# Patient Record
Sex: Female | Born: 1940 | Race: White | Hispanic: No | Marital: Married | Smoking: Current every day smoker
Health system: Southern US, Community
[De-identification: ages and names within clinical notes are randomized; demographics above are authoritative.]

## PROBLEM LIST (undated history)

## (undated) DIAGNOSIS — I739 Peripheral vascular disease, unspecified: Secondary | ICD-10-CM

## (undated) DIAGNOSIS — I499 Cardiac arrhythmia, unspecified: Secondary | ICD-10-CM

## (undated) DIAGNOSIS — I779 Disorder of arteries and arterioles, unspecified: Secondary | ICD-10-CM

## (undated) DIAGNOSIS — K219 Gastro-esophageal reflux disease without esophagitis: Secondary | ICD-10-CM

## (undated) DIAGNOSIS — E7800 Pure hypercholesterolemia, unspecified: Secondary | ICD-10-CM

## (undated) DIAGNOSIS — E039 Hypothyroidism, unspecified: Secondary | ICD-10-CM

## (undated) DIAGNOSIS — I10 Essential (primary) hypertension: Secondary | ICD-10-CM

## (undated) DIAGNOSIS — R0602 Shortness of breath: Secondary | ICD-10-CM

## (undated) DIAGNOSIS — J449 Chronic obstructive pulmonary disease, unspecified: Secondary | ICD-10-CM

## (undated) DIAGNOSIS — M1990 Unspecified osteoarthritis, unspecified site: Secondary | ICD-10-CM

## (undated) HISTORY — DX: Disorder of arteries and arterioles, unspecified: I77.9

## (undated) HISTORY — PX: APPENDECTOMY: SHX54

## (undated) HISTORY — PX: TUBAL LIGATION: SHX77

## (undated) HISTORY — PX: ABDOMINAL HYSTERECTOMY: SHX81

## (undated) HISTORY — DX: Peripheral vascular disease, unspecified: I73.9

---

## 2004-06-11 ENCOUNTER — Ambulatory Visit (HOSPITAL_COMMUNITY): Admission: RE | Admit: 2004-06-11 | Discharge: 2004-06-11 | Payer: Self-pay | Admitting: Internal Medicine

## 2004-06-11 HISTORY — PX: COLONOSCOPY: SHX174

## 2004-06-11 HISTORY — PX: ESOPHAGOGASTRODUODENOSCOPY: SHX1529

## 2004-09-07 ENCOUNTER — Ambulatory Visit: Payer: Self-pay | Admitting: Internal Medicine

## 2004-10-07 ENCOUNTER — Ambulatory Visit (HOSPITAL_COMMUNITY): Admission: RE | Admit: 2004-10-07 | Discharge: 2004-10-07 | Payer: Self-pay | Admitting: Internal Medicine

## 2004-10-07 ENCOUNTER — Ambulatory Visit: Payer: Self-pay | Admitting: Internal Medicine

## 2004-10-07 HISTORY — PX: ESOPHAGOGASTRODUODENOSCOPY: SHX1529

## 2005-04-07 ENCOUNTER — Ambulatory Visit: Payer: Self-pay | Admitting: Internal Medicine

## 2006-04-26 ENCOUNTER — Ambulatory Visit: Payer: Self-pay | Admitting: Internal Medicine

## 2006-05-19 ENCOUNTER — Ambulatory Visit (HOSPITAL_COMMUNITY): Admission: RE | Admit: 2006-05-19 | Discharge: 2006-05-19 | Payer: Self-pay | Admitting: Family Medicine

## 2006-05-24 ENCOUNTER — Ambulatory Visit (HOSPITAL_COMMUNITY): Admission: RE | Admit: 2006-05-24 | Discharge: 2006-05-24 | Payer: Self-pay | Admitting: Family Medicine

## 2008-11-01 ENCOUNTER — Ambulatory Visit (HOSPITAL_COMMUNITY): Admission: RE | Admit: 2008-11-01 | Discharge: 2008-11-01 | Payer: Self-pay | Admitting: Family Medicine

## 2009-08-12 ENCOUNTER — Encounter: Admission: RE | Admit: 2009-08-12 | Discharge: 2009-08-12 | Payer: Self-pay | Admitting: Cardiovascular Disease

## 2010-03-12 ENCOUNTER — Encounter: Admission: RE | Admit: 2010-03-12 | Discharge: 2010-03-12 | Payer: Self-pay | Admitting: Neurology

## 2010-10-11 ENCOUNTER — Encounter: Payer: Self-pay | Admitting: Neurology

## 2011-02-05 NOTE — Op Note (Signed)
NAME:  [name], [name]                  ACCOUNT NO.:  [id number]   MEDICAL RECORD NO.:  [id number]          PATIENT TYPE:  AMB   LOCATION:  DAY                           FACILITY:  [facility]   PHYSICIAN:  R. Roetta Sessions, M.D. DATE OF BIRTH:  [birth year]   DATE OF PROCEDURE:  06/11/2004  DATE OF DISCHARGE:                                 OPERATIVE REPORT   PROCEDURE:  EGD with biopsy followed by colonoscopy.   INDICATIONS FOR PROCEDURE:  The patient is a 70 year old lady with a recent  episode of epigastric pain which she states was self-limiting.  She has not  had any epigastric pain in a month.  No odynophagia, dysphagia, early  satiety, or reflux symptoms.  No melena or rectal bleeding.  She has also  been referred for screening colonoscopy.  No family history of colorectal  neoplasia.  She has never had a colonoscopy previously.  EGD and colonoscopy  are now being done.  This approach has been discussed with the patient at  length previously.  The potential risks, benefits, and alternatives have  been reviewed and questions answered.  She is agreeable.  Please see the  documentation in the medical record for more information.   PROCEDURE:  O2 saturation, blood pressure, pulses, and respirations were  monitored throughout the entirety of both procedures.  Conscious sedation  was with Versed 5 mg IV, Demerol 100 mg IV in divided doses.  The instrument  used was the Olympus video chip system.   EGD FINDINGS:  Examination of the tubular esophagus revealed distal erosions  just above the EG junction.  No Barrett's esophagus or other abnormalities.  The EG junction was easily traversed.   Stomach:  The gastric cavity was empty and insufflated well with air.  Thorough examination of the gastric mucosa including retroflex view in the  proximal stomach and esophagogastric junction demonstrated an area of  heaped up adenomatous-appearing prepyloric mucosa localized to about a 3 x  4-cm area.   This area was soft when palpated with the biopsy forceps.  There  was also a 0.75-cm antral ulcer superior to the pyloric channel (located a  little ways from the adenomatous-appearing mucosa).  These areas were  biopsied separately.  The remainder of the gastric mucosa appeared normal.  The pylorus was patent and easily traversed.   Duodenum:  Examination of the bulb and second portion revealed no  abnormalities.   THERAPEUTIC/DIAGNOSTIC MANEUVERS PERFORMED:  As above.   The patient tolerated the procedure well and was prepared for colonoscopy.   COLONOSCOPY FINDINGS:  Digital rectal examination revealed no abnormalities,  aside from multiple external hemorrhoid tags.   ENDOSCOPIC FINDINGS:  The prep was good.   Rectum:  Examination of the rectal mucosa including retroflex view of the  anal verge revealed no abnormalities.   Colon:  The colonic mucosa was surveyed from the rectosigmoid junction to 40  cm.  At this level the colon took an acute turn.  It was noncompliant.  I  was unable to negotiate it with an adult colonoscope.  I withdrew the  adult  scope and obtained a pediatric scope.  It was notable that both pediatric  scopes were not readily available, and this caused interruption of the  procedure for 30 minutes.  I did obtain the pediatric colonoscope and was  able to easily advance the scope beyond the left colon all the way around to  the cecum/ileocecal valve/appendiceal orifice.  These structures were well-  seen.  From this level, the scope was slowly withdrawn.  All previously  mentioned mucosal surfaces were again seen.  The patient had a couple of  small sigmoid diverticula.  The remainder of the colonic mucosa appeared  normal.  The patient tolerated both of the procedures well and was reactive  in endoscopy.   EGD IMPRESSION:  1.  Distal esophageal erosions consistent with mild erosive reflux      esophagitis.  Otherwise normal esophagus.  2.  Heaped up  adenomatous-appearing prepyloric antral mucosa of uncertain      significance, biopsied.  3.  A single 0.75-cm antral prepyloric ulcer, also biopsied separately.  The      remainder of the gastric mucosa appeared normal.  4.  Patent pylorus.  5.  Normal first and second portions of the duodenum.   COLONOSCOPY IMPRESSION:  1.  External hemorrhoids.  Otherwise normal rectum.  2.  A few scattered left-sided diverticula.  The remainder of the colonic      mucosa appeared normal.   RECOMMENDATIONS:  1.  Begin Aciphex 20 mg orally daily.  2.  We need to make sure she is avoiding all nonsteroidals.  3.  Check H pylori serologies.  4.  Follow up on biopsies.  5.  Followup appointment with [place] in three months.  She will need a repeat      EGD down the line to document gastric ulcer healing.  6.  Repeat screening colonoscopy in 10 years.      RMR/MEDQ  D:  06/11/2004  T:  06/11/2004  Job:  [id number]   cc:   Mila Homer. [name], M.D.  [address] [place], [place] [address]  Fax: [phone]

## 2011-02-05 NOTE — H&P (Signed)
NAME:  [name], [name]                  ACCOUNT NO.:  [id number]   MEDICAL RECORD NO.:  [id number]           PATIENT TYPE:   LOCATION:                                 FACILITY:   PHYSICIAN:  R. Roetta Sessions, M.D. DATE OF BIRTH:  07-14-1941   DATE OF ADMISSION:  DATE OF DISCHARGE:  LH                                HISTORY & PHYSICAL   CHIEF COMPLAINT:  Esophagogastroduodenoscopy, follow up peptic ulcer  disease.   HISTORY OF PRESENT ILLNESS:  [name] is a 70 year old Caucasian female  patient of Dr. Jena Gauss who had undergone EGD for one month episode of self-  limiting epigastric pain. She was found to have distal esophageal erosions  consistent with mild reflux esophagitis. She was also found to be  helicobacter pylori positive. It was also noted that she had a single 0.75-  cm antral prepyloric ulcer as well. She has been treated with triple drug  therapy for helicobacter pylori and tolerated this well. She is doing quite  well. She denies any epigastric pain, heartburn, indigestion, dysphagia,  odynophagia. She denies any nausea or vomiting. She denies any anorexia or  fatigue. Denies any abdominal pain, rectal bleeding, or melena.   Biopsy from the antrum revealed chronic gastritis with prominent lymphoid  infiltrating the area of deep lamina propria and muscularis mucosa.   PAST MEDICAL HISTORY:  1.  Hypothyroidism.  2.  Total hysterectomy.  3.  Appendectomy.   CURRENT MEDICATIONS:  1.  Multivitamin daily.  2.  Zocor 10 mg daily.  3.  Aspirin 81 mg daily on hold currently.  4.  Synthroid 125 mg daily.  5.  Metoprolol 50 mg daily.  6.  Vitamin E and vitamin D daily.  7.  Caltrate daily.  8.  Aciphex 20 mg daily.   ALLERGIES:  CODEINE.   FAMILY HISTORY:  No known family history of colorectal carcinoma of chronic  GI problems. Mother has history of coronary artery disease. Father deceased  secondary to carcinoma. One sister deceased secondary to renal failure. She  has  three sisters and three brothers who are relatively healthy.   SOCIAL HISTORY:  [name] has been married for 44 years. She has two grown  healthy children. She is currently retired. She reports a 47-pack-year  history. Denies any alcohol or drug use.   REVIEW OF SYSTEMS:  CONSTITUTIONAL:  Weight is stable. She denies any fever  or chills. She denies any early satiety. She denies any anorexia.  CARDIOVASCULAR:  Denies any chest pain or palpitations. PULMONARY:  Denies  any shortness of breath, dyspnea, cough, or hemoptysis. HEMATOLOGICAL:  Denies any history of blood dyscrasias, anemia, easy bruising, or epistaxis.  GASTROINTESTINAL:  See HPI.   PHYSICAL EXAMINATION:  VITAL SIGNS:  Weight 174 pounds, temperature 97.9,  blood pressure 158/80, pulse 68.  GENERAL:  [name] is a well-developed, well-nourished, 70 year old  Caucasian female who is alert, pleasant, cooperative in no acute distress.  HEENT:  Sclerae clear, nonicteric. Conjunctivae pink. Oropharynx pink and  moist without any lesions.  NECK:  Supple without any masses  or thyromegaly.  CHEST:  Heart rate regular rhythm with normal S1 and S2 without any murmurs,  clicks, rubs, or gallops. Lung are clear to auscultation bilaterally.  ABDOMEN:  She does have epigastric tenderness on deep palpation. The abdomen  is nondistended without palpable masses or palpable organomegaly. No rebound  tenderness or guarding.  EXTREMITIES:  2+ pedal pulses bilaterally. No edema.  SKIN:  Pink, warm and dry without rash or jaundice.   ASSESSMENT:  [name] is a 70 year old Caucasian female with one month  history of epigastric pain which has now resolved. She was found to be  helicobacter pylori positive and also found to have a single 3/4-cm antral  prepyloric ulcer on esophagogastroduodenoscopy by Dr. Jena Gauss June 11, 2004. She is due for repeat EGD to verify resolution of this ulcer.  Symptomatically, she is doing quite well.    RECOMMENDATIONS:  1.  Will schedule EGD with Dr. Jena Gauss in the near future. I have discussed      the procedure including the risks and benefits which include but are not      limited to bleeding, infection, perforation, and drug reaction. Consent      will be obtained. She agrees with this plan. She is to continue to hold      her aspirin at this time with further recommendations pending procedure.  2.  She is to continue Aciphex 20 mg daily.  3.  Recommended to quit smoking.  4.  Further recommendations pending EGD.     Kand   KC/MEDQ  D:  09/07/2004  T:  09/07/2004  Job:  [id number]   cc:   Mila Homer. [name], M.D.  [address] [place], [place] [address]  Fax: [phone]

## 2011-02-05 NOTE — Op Note (Signed)
NAME:  [name], [name]                  ACCOUNT NO.:  [id number]   MEDICAL RECORD NO.:  [id number]          PATIENT TYPE:  AMB   LOCATION:  DAY                           FACILITY:  [facility]   PHYSICIAN:  R. Roetta Sessions, M.D. DATE OF BIRTH:  1940-11-17   DATE OF PROCEDURE:  10/07/2004  DATE OF DISCHARGE:                                 OPERATIVE REPORT   PROCEDURE PERFORMED:  Diagnostic esophagogastroduodenoscopy.   INDICATIONS FOR PROCEDURE:  The patient is a 70 year old lady who was  diagnosed with a prepyloric peptic ulcer back in September of 2005.  Colonoscopy at that time demonstrated external hemorrhoids, left-sided  diverticula.  Helicobacter pylori was found to be present.  She was treated  with triple drug therapy clinically.  She has done very well.  She also had  some reflux esophagitis on prior EGD.  Biopsies of the ulcer were negative  for malignancy.  Esophagogastroduodenoscopy is now being done to document  that this ulcer has healed.  This approach has been discussed with the  patient at length previously and again today at bedside.  The potential  risks, benefits and alternatives have been reviewed, questions answered.   Oxygen saturations, blood pressure, pulse and respiratory rate were  monitored throughout the entire procedure.  Conscious sedation was Versed 3  mg IV, Demerol 75 mg IV in divided doses.  Instrument was the Olympus video  chip system.   FINDINGS:  Examination of the tubular esophagus revealed erosions  circumferentially within 0.5 cm of the esophagogastric junction.  Esophageal  mucosa otherwise appeared normal.  The esophagogastric junction was easily  traversed.   Stomach:  The gastric cavity was more or less empty except for some bile  stained mucus.  This was easily suctioned away.  It insufflated well with  air.  A thorough examination of the gastric mucosa including retroflex view  of the proximal stomach, esophagogastric junction demonstrated no  mucosal  abnormalities.  The previously noted antral ulcer has healed completely.  Pylorus was patent, easily traversed.  Examination of the bulb and second  portion revealed no abnormalities.   THERAPEUTIC/DIAGNOSTIC MANEUVERS PERFORMED:  None.   The patient tolerated the procedure well and was reacted in endoscopy.   IMPRESSION:  1.  Distal esophageal erosions consistent with erosive reflux esophagus,      otherwise normal esophagus, normal stomach, previously noted ulcer has      healed completely.  2.  Normal D1 and D2.   RECOMMENDATIONS:  1.  The patient is encouraged to quit smoking.  2.  Continue Aciphex 20 mg by mouth daily.  3.  Antireflux measures emphasized.  4.  From a screening standpoint the patient needs a repeat colonoscopy in      2015.  5.  Plan to see this nice lady back in the office six months from now.     [name]   RMR/MEDQ  D:  10/07/2004  T:  10/07/2004  Job:  [id number]   cc:   Mila Homer. [name], M.D.  [address] [place], [place] [address]  Fax:  [phone] 

## 2011-04-09 ENCOUNTER — Other Ambulatory Visit (HOSPITAL_COMMUNITY): Payer: Self-pay | Admitting: Family Medicine

## 2011-04-09 ENCOUNTER — Ambulatory Visit (HOSPITAL_COMMUNITY)
Admission: RE | Admit: 2011-04-09 | Discharge: 2011-04-09 | Disposition: A | Discharge status: Home / Self Care | Payer: Medicare Other | Source: Ambulatory Visit | Attending: Family Medicine | Admitting: Family Medicine

## 2011-04-09 DIAGNOSIS — J40 Bronchitis, not specified as acute or chronic: Secondary | ICD-10-CM

## 2011-04-09 DIAGNOSIS — R05 Cough: Secondary | ICD-10-CM | POA: Insufficient documentation

## 2011-04-09 DIAGNOSIS — R51 Headache: Secondary | ICD-10-CM | POA: Insufficient documentation

## 2011-04-09 DIAGNOSIS — R059 Cough, unspecified: Secondary | ICD-10-CM | POA: Insufficient documentation

## 2012-08-25 ENCOUNTER — Other Ambulatory Visit: Payer: Self-pay | Admitting: Neurological Surgery

## 2012-08-25 DIAGNOSIS — I729 Aneurysm of unspecified site: Secondary | ICD-10-CM

## 2012-09-08 ENCOUNTER — Other Ambulatory Visit: Payer: Self-pay | Admitting: Neurological Surgery

## 2012-09-08 LAB — BUN: BUN: 10 mg/dL (ref 6–23)

## 2012-10-02 ENCOUNTER — Ambulatory Visit
Admission: RE | Admit: 2012-10-02 | Discharge: 2012-10-02 | Disposition: A | Discharge status: Home / Self Care | Payer: Medicare Other | Source: Ambulatory Visit | Attending: Neurological Surgery | Admitting: Neurological Surgery

## 2012-10-02 DIAGNOSIS — I729 Aneurysm of unspecified site: Secondary | ICD-10-CM

## 2012-10-02 MED ORDER — IOHEXOL 350 MG/ML SOLN
100.0000 mL | Freq: Once | INTRAVENOUS | Status: AC | PRN
  Administered 2012-10-02: 100 mL via INTRAVENOUS

## 2013-01-04 ENCOUNTER — Other Ambulatory Visit (HOSPITAL_COMMUNITY): Payer: Self-pay | Admitting: Cardiovascular Disease

## 2013-01-04 DIAGNOSIS — I739 Peripheral vascular disease, unspecified: Secondary | ICD-10-CM

## 2013-01-17 ENCOUNTER — Ambulatory Visit (HOSPITAL_COMMUNITY)
Admission: RE | Admit: 2013-01-17 | Discharge: 2013-01-17 | Disposition: A | Discharge status: Home / Self Care | Payer: Medicare Other | Source: Ambulatory Visit | Attending: Cardiovascular Disease | Admitting: Cardiovascular Disease

## 2013-01-17 DIAGNOSIS — I739 Peripheral vascular disease, unspecified: Secondary | ICD-10-CM | POA: Insufficient documentation

## 2013-01-17 NOTE — Progress Notes (Signed)
Carotid artery duplex completed.   01/17/2013  Madilyn Cephas, RDMS, RDCS  

## 2013-02-09 ENCOUNTER — Telehealth: Payer: Self-pay | Admitting: *Deleted

## 2013-02-09 DIAGNOSIS — I6523 Occlusion and stenosis of bilateral carotid arteries: Secondary | ICD-10-CM

## 2013-02-09 NOTE — Telephone Encounter (Signed)
Message copied by Marella Bile on Fri Feb 09, 2013  4:34 PM ------      Message from: Runell Gess      Created: Wed Jan 31, 2013  7:41 PM       Mild-Mod  LICA stenosis. Repeat in 6 mo ------

## 2013-02-09 NOTE — Telephone Encounter (Signed)
Carotid dopplers in 6 months

## 2013-04-10 ENCOUNTER — Other Ambulatory Visit (HOSPITAL_COMMUNITY): Payer: Self-pay | Admitting: Family Medicine

## 2013-04-10 ENCOUNTER — Ambulatory Visit (HOSPITAL_COMMUNITY)
Admission: RE | Admit: 2013-04-10 | Discharge: 2013-04-10 | Disposition: A | Discharge status: Home / Self Care | Payer: Medicare Other | Source: Ambulatory Visit | Attending: Family Medicine | Admitting: Family Medicine

## 2013-04-10 DIAGNOSIS — R109 Unspecified abdominal pain: Secondary | ICD-10-CM

## 2013-04-10 DIAGNOSIS — N289 Disorder of kidney and ureter, unspecified: Secondary | ICD-10-CM | POA: Insufficient documentation

## 2013-04-10 DIAGNOSIS — M549 Dorsalgia, unspecified: Secondary | ICD-10-CM

## 2013-04-12 ENCOUNTER — Other Ambulatory Visit (HOSPITAL_COMMUNITY): Payer: Self-pay | Admitting: Family Medicine

## 2013-04-12 ENCOUNTER — Ambulatory Visit (HOSPITAL_COMMUNITY)
Admission: RE | Admit: 2013-04-12 | Discharge: 2013-04-12 | Disposition: A | Discharge status: Home / Self Care | Payer: Medicare Other | Source: Ambulatory Visit | Attending: Family Medicine | Admitting: Family Medicine

## 2013-04-12 DIAGNOSIS — M545 Low back pain, unspecified: Secondary | ICD-10-CM | POA: Insufficient documentation

## 2013-04-12 DIAGNOSIS — D1809 Hemangioma of other sites: Secondary | ICD-10-CM | POA: Insufficient documentation

## 2013-04-12 DIAGNOSIS — M549 Dorsalgia, unspecified: Secondary | ICD-10-CM

## 2013-04-12 DIAGNOSIS — IMO0002 Reserved for concepts with insufficient information to code with codable children: Secondary | ICD-10-CM | POA: Insufficient documentation

## 2013-04-12 DIAGNOSIS — R109 Unspecified abdominal pain: Secondary | ICD-10-CM

## 2013-04-12 DIAGNOSIS — M51379 Other intervertebral disc degeneration, lumbosacral region without mention of lumbar back pain or lower extremity pain: Secondary | ICD-10-CM | POA: Insufficient documentation

## 2013-04-12 DIAGNOSIS — M546 Pain in thoracic spine: Secondary | ICD-10-CM | POA: Insufficient documentation

## 2013-04-12 DIAGNOSIS — M5137 Other intervertebral disc degeneration, lumbosacral region: Secondary | ICD-10-CM | POA: Insufficient documentation

## 2013-04-12 DIAGNOSIS — R933 Abnormal findings on diagnostic imaging of other parts of digestive tract: Secondary | ICD-10-CM | POA: Insufficient documentation

## 2013-04-12 LAB — POCT I-STAT, CHEM 8
BUN: 5 mg/dL — ABNORMAL LOW (ref 6–23)
Calcium, Ion: 1.21 mmol/L (ref 1.13–1.30)
Creatinine, Ser: 0.9 mg/dL (ref 0.50–1.10)
Glucose, Bld: 116 mg/dL — ABNORMAL HIGH (ref 70–99)
HCT: 44 % (ref 36.0–46.0)
Potassium: 4 mEq/L (ref 3.5–5.1)
Sodium: 141 mEq/L (ref 135–145)

## 2013-04-12 MED ORDER — IOHEXOL 300 MG/ML  SOLN
100.0000 mL | Freq: Once | INTRAMUSCULAR | Status: AC | PRN
  Administered 2013-04-12: 100 mL via INTRAVENOUS

## 2013-04-25 ENCOUNTER — Encounter (HOSPITAL_COMMUNITY): Payer: Self-pay | Admitting: Pharmacy Technician

## 2013-04-25 NOTE — Consult Note (Signed)
NAME:  [name], [name]                  ACCOUNT NO.:  [id number]  MEDICAL RECORD NO.:  [id number]  LOCATION:  RAD                           FACILITY:  [facility]  PHYSICIAN:  Addysyn Fern, M.D. 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  She was seen and worked up medically by Dr. Knowlton.  She has also been seen this year as well by Dr. Berry and Dr. Elsner.  In essence, she was being worked up for flank pain by Dr. Knowlton, who noted on CT scan the presence of a porcelain gallbladder and referred her after his medical workup to me for surgery.  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  I discussed these findings with the patient telling her that this carried a very high risk for cancer and that the gallbladder should be removed and even in the absence of symptoms.  We discussed laparoscopic removal and I discussed the complications not limited to, but including bleeding, infection, damage to bile ducts, perforation of organs, transitory diarrhea, and the possibility of open surgery might be required.  Informed consent was obtained.  PAST MEDICAL HISTORY:  The patient has a history of thyroid disease, hypothyroidism, hypertension, hypercholesterolemia, COPD, tobacco abuse, and she has also been followed for a cerebral aneurysm which according to her last visit this has not any undergone any changes.  She was seen last in January of this year with this and there was some question whether or not this was really an aneurysm.  She is also being seen by Dr. Berry, who performs carotid Doppler studies on her for the presence of bilateral bruits.  PAST SURGICAL HISTORY:  An appendectomy in childhood.  She  also had a TAH-BSO some 12 years ago.  She had a tubal ligation at the age of 30. She also had some uterine suspension surgery done by Dr. Destefano years ago as well.  MEDICATIONS:  Please consult chart.  ALLERGIES:  She states that she has an allergy to codeine.  SOCIAL HISTORY:  She smokes half a pack of cigarettes per day.  She does not abuse alcohol.  PHYSICAL EXAMINATION:  VITAL SIGNS:  She is 5 feet 5 inches and weighs 164 pounds.  She has a temperature is 98.7, pulse was regular at 60, respirations 14, blood pressure is 142/70. HEENT:  Head is normocephalic.  Eyes, extraocular movements are intact. Pupils were round and reactive to light and accommodation.  There is no conjunctiva pallor or scleral injection.  The sclera has a normal tincture.  Nasal and oral mucosa are somewhat erythematous, but moist. There is no cervical adenopathy appreciated.  As noted there are bilateral bruits.  There is no thyromegaly.  No jugular vein distention. CHEST:  Clear, both anterior and posterior auscultation. HEART:  Regular rhythm. BREASTS AND AXILLA:  Without masses.  No history of nipple discharge. ABDOMEN:  Soft.  No   hernias.  She has a midline incision and also a Pfannenstiel incision, and also another right lower quadrant incision consistent with her previously mentioned surgeries. RECTAL:  There are no masses palpated and the stool is guaiac-negative. EXTREMITIES:  The patient has no pedal edema.  Pulses are symmetrical. The patient has varicosities bilaterally.  REVIEW OF SYSTEMS:  No history of migraines, seizures, or any lateralizing neurological findings.  The patient has had carotid Doppler studies for followup on bruits as mentioned. ENDOCRINE:  No history of diabetes.  She does have a history of thyroid disease, hypothyroidism.  She has no history of adrenal problems. CARDIOPULMONARY:  History of hypertension, hypercholesterolemia, and bilateral carotid bruits with no  indication for surgery according to the last visit. MUSCULOSKELETAL:  She has chronic arthritic back complaints. OB/GYN:  She is a gravida 2, para 2, cesarean 0, abortus 0 female.  She has had a past family history of a sister with carcinoma of the breast. As stated she has had a TAH-BSO, tubal ligation, and uterine suspension. Her last mammogram was this year and was normal.  Last menstrual period was 12 years ago when she had her hysterectomy. GI:  No history of hepatitis, constipation, diarrhea, bright red rectal bleeding, black tarry stools or history of inflammatory bowel disease or irritable bowel syndrome.  No history of unexplained weight loss.  The patient has diverticulosis.  Last colonoscopy was in 2005. GU:  No frequency, dysuria, or history of kidney stones.  Review of history and physical, therefore [name] has been seen by the medical service and cleared by Dr. Knowlton, and we will plan for laparoscopic cholecystectomy due to the findings of porcelain gallbladder to rule out carcinoma of the gallbladder.  This was discussed in detail with the patient and informed consent was obtained. We will also send a copy of this dictation to Dr. Knowlton, Dr. Berry, and Dr. Elsner.  I would like to thank Dr. Knowlton for his confidence in sending his patient my way.     Akili Cuda, M.D.     WB/MEDQ  D:  04/25/2013  T:  04/25/2013  Job:  [id number]  cc:   Stephen D. Knowlton, M.D. Fax: [phone]  Jonathan Berry, M.D. Fax: [phone]  Henry J. Elsner, M.D. Fax: [phone] 

## 2013-04-25 NOTE — Patient Instructions (Addendum)
[name]  04/25/2013   Your procedure is scheduled on:  04/30/2013  Report to [facility] at  945  AM.  Call this number if you have problems the morning of surgery: [phone]   Remember:   Do not eat food or drink liquids after midnight.   Take these medicines the morning of surgery with A SIP OF WATER: none   Do not wear jewelry, make-up or nail polish.  Do not wear lotions, powders, or perfumes.   Do not shave 48 hours prior to surgery. Men may shave face and neck.  Do not bring valuables to the hospital.  [facility] is not responsible for any belongings or valuables.  Contacts, dentures or bridgework may not be worn into surgery.  Leave suitcase in the car. After surgery it may be brought to your room.  For patients admitted to the hospital, checkout time is 11:00 AM the day of discharge.   Patients discharged the day of surgery will not be allowed to drive home.  Name and phone number of your driver: family  Special Instructions: Shower using CHG 2 nights before surgery and the night before surgery.  If you shower the day of surgery use CHG.  Use special wash - you have one bottle of CHG for all showers.  You should use approximately 1/3 of the bottle for each shower.   Please read over the following fact sheets that you were given: Pain Booklet, Coughing and Deep Breathing, Surgical Site Infection Prevention, Anesthesia Post-op Instructions and Care and Recovery After Surgery Laparoscopic Cholecystectomy Laparoscopic cholecystectomy is surgery to remove the gallbladder. The gallbladder is located slightly to the right of center in the abdomen, behind the liver. It is a concentrating and storage sac for the bile produced in the liver. Bile aids in the digestion and absorption of fats. Gallbladder disease (cholecystitis) is an inflammation of your gallbladder. This condition is usually caused by a buildup of gallstones (cholelithiasis) in your gallbladder. Gallstones can block  the flow of bile, resulting in inflammation and pain. In severe cases, emergency surgery may be required. When emergency surgery is not required, you will have time to prepare for the procedure. Laparoscopic surgery is an alternative to open surgery. Laparoscopic surgery usually has a shorter recovery time. Your common bile duct may also need to be examined and explored. Your caregiver will discuss this with you if he or she feels this should be done. If stones are found in the common bile duct, they may be removed. LET YOUR CAREGIVER KNOW ABOUT:  Allergies to food or medicine.  Medicines taken, including vitamins, herbs, eyedrops, over-the-counter medicines, and creams.  Use of steroids (by mouth or creams).  Previous problems with anesthetics or numbing medicines.  History of bleeding problems or blood clots.  Previous surgery.  Other health problems, including diabetes and kidney problems.  Possibility of pregnancy, if this applies. RISKS AND COMPLICATIONS All surgery is associated with risks. Some problems that may occur following this procedure include:  Infection.  Damage to the common bile duct, nerves, arteries, veins, or other internal organs such as the stomach or intestines.  Bleeding.  A stone may remain in the common bile duct. BEFORE THE PROCEDURE  Do not take aspirin for 3 days prior to surgery or blood thinners for 1 week prior to surgery.  Do not eat or drink anything after midnight the night before surgery.  Let your caregiver know if you develop a cold  or other infectious problem prior to surgery.  You should be present 60 minutes before the procedure or as directed. PROCEDURE  You will be given medicine that makes you sleep (general anesthetic). When you are asleep, your surgeon will make several small cuts (incisions) in your abdomen. One of these incisions is used to insert a small, lighted scope (laparoscope) into the abdomen. The laparoscope helps the  surgeon see into your abdomen. Carbon dioxide gas will be pumped into your abdomen. The gas allows more room for the surgeon to perform your surgery. Other operating instruments are inserted through the other incisions. Laparoscopic procedures may not be appropriate when:  There is major scarring from previous surgery.  The gallbladder is extremely inflamed.  There are bleeding disorders or unexpected cirrhosis of the liver.  A pregnancy is near term.  Other conditions make the laparoscopic procedure impossible. If your surgeon feels it is not safe to continue with a laparoscopic procedure, he or she will perform an open abdominal procedure. In this case, the surgeon will make an incision to open the abdomen. This gives the surgeon a larger view and field to work within. This may allow the surgeon to perform procedures that sometimes cannot be performed with a laparoscope alone. Open surgery has a longer recovery time. AFTER THE PROCEDURE  You will be taken to the recovery area where a nurse will watch and check your progress.  You may be allowed to go home the same day.  Do not resume physical activities until directed by your caregiver.  You may resume a normal diet and activities as directed. Document Released: 09/06/2005 Document Revised: 11/29/2011 Document Reviewed: 02/19/2011 [facility] Patient Information 2014 [place], [place]. PATIENT INSTRUCTIONS POST-ANESTHESIA  IMMEDIATELY FOLLOWING SURGERY:  Do not drive or operate machinery for the first twenty four hours after surgery.  Do not make any important decisions for twenty four hours after surgery or while taking narcotic pain medications or sedatives.  If you develop intractable nausea and vomiting or a severe headache please notify your doctor immediately.  FOLLOW-UP:  Please make an appointment with your surgeon as instructed. You do not need to follow up with anesthesia unless specifically instructed to do so.  WOUND CARE  INSTRUCTIONS (if applicable):  Keep a dry clean dressing on the anesthesia/puncture wound site if there is drainage.  Once the wound has quit draining you may leave it open to air.  Generally you should leave the bandage intact for twenty four hours unless there is drainage.  If the epidural site drains for more than 36-48 hours please call the anesthesia department.  QUESTIONS?:  Please feel free to call your physician or the hospital operator if you have any questions, and they will be happy to assist you.

## 2013-04-25 NOTE — Consult Note (Deleted)
[name], [name]                  ACCOUNT NO.:  [id number]  MEDICAL RECORD NO.:  [id number]  LOCATION:  RAD                           FACILITY:  [facility]  PHYSICIAN:  Barbaraann Barthel, M.D. 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  She was seen and worked up medically by Dr. [name].  She has also been seen this year as well by Dr. Allyson Sabal and Dr. Danielle Dess.  In essence, she was being worked up for flank pain by Dr. [name], who noted on CT scan the presence of a porcelain gallbladder and referred her after his medical workup to me for surgery.  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  I discussed these findings with the patient telling her that this carried a very high risk for cancer and that the gallbladder should be removed and even in the absence of symptoms.  We discussed laparoscopic removal and I discussed the complications not limited to, but including bleeding, infection, damage to bile ducts, perforation of organs, transitory diarrhea, and the possibility of open surgery might be required.  Informed consent was obtained.  PAST MEDICAL HISTORY:  The patient has a history of thyroid disease, hypothyroidism, hypertension, hypercholesterolemia, COPD, tobacco abuse, and she has also been followed for a cerebral aneurysm which according to her last visit this has not any undergone any changes.  She was seen last in January of this year with this and there was some question whether or not this was really an aneurysm.  She is also being seen by Dr. Allyson Sabal, who performs carotid Doppler studies on her for the presence of bilateral bruits.  PAST SURGICAL HISTORY:  An appendectomy in childhood.  She  also had a TAH-BSO some 12 years ago.  She had a tubal ligation at the age of 70. She also had some uterine suspension surgery done by Dr. Leona Carry years ago as well.  MEDICATIONS:  Please consult chart.  ALLERGIES:  She states that she has an allergy to codeine.  SOCIAL HISTORY:  She smokes half a pack of cigarettes per day.  She does not abuse alcohol.  PHYSICAL EXAMINATION:  VITAL SIGNS:  She is 5 feet 5 inches and weighs 164 pounds.  She has a temperature is 98.7, pulse was regular at 60, respirations 14, blood pressure is 142/70. HEENT:  Head is normocephalic.  Eyes, extraocular movements are intact. Pupils were round and reactive to light and accommodation.  There is no conjunctiva pallor or scleral injection.  The sclera has a normal tincture.  Nasal and oral mucosa are somewhat erythematous, but moist. There is no cervical adenopathy appreciated.  As noted there are bilateral bruits.  There is no thyromegaly.  No jugular vein distention. CHEST:  Clear, both anterior and posterior auscultation. HEART:  Regular rhythm. BREASTS AND AXILLA:  Without masses.  No history of nipple discharge. ABDOMEN:  Soft.  No  hernias.  She has a midline incision and also a Pfannenstiel incision, and also another right lower quadrant incision consistent with her previously mentioned surgeries. RECTAL:  There are no masses palpated and the stool is guaiac-negative. EXTREMITIES:  The patient has no pedal edema.  Pulses are symmetrical. The patient has varicosities bilaterally.  REVIEW OF SYSTEMS:  No history of migraines, seizures, or any lateralizing neurological findings.  The patient has had carotid Doppler studies for followup on bruits as mentioned. ENDOCRINE:  No history of diabetes.  She does have a history of thyroid disease, hypothyroidism.  She has no history of adrenal problems. CARDIOPULMONARY:  History of hypertension, hypercholesterolemia, and bilateral carotid bruits with no  indication for surgery according to the last visit. MUSCULOSKELETAL:  She has chronic arthritic back complaints. OB/GYN:  She is a gravida 2, para 2, cesarean 0, abortus 0 female.  She has had a past family history of a sister with carcinoma of the breast. As stated she has had a TAH-BSO, tubal ligation, and uterine suspension. Her last mammogram was this year and was normal.  Last menstrual period was 12 years ago when she had her hysterectomy. GI:  No history of hepatitis, constipation, diarrhea, bright red rectal bleeding, black tarry stools or history of inflammatory bowel disease or irritable bowel syndrome.  No history of unexplained weight loss.  The patient has diverticulosis.  Last colonoscopy was in 2005. GU:  No frequency, dysuria, or history of kidney stones.  Review of history and physical, therefore [name] has been seen by the medical service and cleared by Dr. [name], and we will plan for laparoscopic cholecystectomy due to the findings of porcelain gallbladder to rule out carcinoma of the gallbladder.  This was discussed in detail with the patient and informed consent was obtained. We will also send a copy of this dictation to Dr. [name], Dr. Allyson Sabal, and Dr. Danielle Dess.  I would like to thank Dr. [name] for his confidence in sending his patient my way.     Barbaraann Barthel, M.D.     WB/MEDQ  D:  04/25/2013  T:  04/25/2013  Job:  [id number]  cc:   [name]. [name], M.D. Fax: [phone]  [name], M.D. Fax: [phone]  [name], M.D. Fax: [phone]

## 2013-04-26 ENCOUNTER — Encounter (HOSPITAL_COMMUNITY)
Admission: RE | Admit: 2013-04-26 | Discharge: 2013-04-26 | Disposition: A | Discharge status: Home / Self Care | Payer: Medicare Other | Source: Ambulatory Visit | Attending: General Surgery | Admitting: General Surgery

## 2013-04-26 ENCOUNTER — Encounter (HOSPITAL_COMMUNITY): Payer: Self-pay

## 2013-04-26 DIAGNOSIS — Z01810 Encounter for preprocedural cardiovascular examination: Secondary | ICD-10-CM | POA: Insufficient documentation

## 2013-04-26 DIAGNOSIS — K828 Other specified diseases of gallbladder: Secondary | ICD-10-CM | POA: Insufficient documentation

## 2013-04-26 DIAGNOSIS — Z01812 Encounter for preprocedural laboratory examination: Secondary | ICD-10-CM | POA: Insufficient documentation

## 2013-04-26 HISTORY — DX: Essential (primary) hypertension: I10

## 2013-04-26 HISTORY — DX: Unspecified osteoarthritis, unspecified site: M19.90

## 2013-04-26 HISTORY — DX: Chronic obstructive pulmonary disease, unspecified: J44.9

## 2013-04-26 HISTORY — DX: Pure hypercholesterolemia, unspecified: E78.00

## 2013-04-26 HISTORY — DX: Shortness of breath: R06.02

## 2013-04-26 HISTORY — DX: Gastro-esophageal reflux disease without esophagitis: K21.9

## 2013-04-26 HISTORY — DX: Cardiac arrhythmia, unspecified: I49.9

## 2013-04-26 LAB — HEPATIC FUNCTION PANEL
AST: 17 U/L (ref 0–37)
Albumin: 4 g/dL (ref 3.5–5.2)
Total Bilirubin: 0.4 mg/dL (ref 0.3–1.2)
Total Protein: 7.2 g/dL (ref 6.0–8.3)

## 2013-04-26 LAB — BASIC METABOLIC PANEL
BUN: 9 mg/dL (ref 6–23)
GFR calc Af Amer: >90 mL/min (ref >90)
GFR calc non Af Amer: 83 mL/min — ABNORMAL LOW (ref >90)
Potassium: 4.3 mEq/L (ref 3.5–5.1)
Sodium: 140 mEq/L (ref 135–145)

## 2013-04-26 LAB — CBC WITH DIFFERENTIAL/PLATELET
Basophils Absolute: 0 10*3/uL (ref 0.0–0.1)
Basophils Relative: 1 % (ref 0–1)
Hemoglobin: 13.2 g/dL (ref 12.0–15.0)
MCHC: 33.2 g/dL (ref 30.0–36.0)
Monocytes Relative: 9 % (ref 3–12)
Neutro Abs: 3.3 10*3/uL (ref 1.7–7.7)
Neutrophils Relative %: 59 % (ref 43–77)
Platelets: 227 10*3/uL (ref 150–400)
RBC: 4.49 MIL/uL (ref 3.87–5.11)

## 2013-04-30 ENCOUNTER — Ambulatory Visit (HOSPITAL_COMMUNITY): Payer: Medicare Other | Admitting: Anesthesiology

## 2013-04-30 ENCOUNTER — Encounter (HOSPITAL_COMMUNITY): Payer: Self-pay | Admitting: Anesthesiology

## 2013-04-30 ENCOUNTER — Encounter (HOSPITAL_COMMUNITY): Payer: Self-pay | Admitting: *Deleted

## 2013-04-30 ENCOUNTER — Inpatient Hospital Stay (HOSPITAL_COMMUNITY)
Admission: RE | Admit: 2013-04-30 | Discharge: 2013-05-02 | DRG: 416 | Disposition: A | Discharge status: Home / Self Care | Payer: Medicare Other | Source: Ambulatory Visit | Attending: General Surgery | Admitting: General Surgery

## 2013-04-30 ENCOUNTER — Encounter (HOSPITAL_COMMUNITY)
Admission: RE | Disposition: A | Discharge status: Home / Self Care | Payer: Self-pay | Source: Ambulatory Visit | Attending: General Surgery

## 2013-04-30 DIAGNOSIS — J449 Chronic obstructive pulmonary disease, unspecified: Secondary | ICD-10-CM | POA: Diagnosis present

## 2013-04-30 DIAGNOSIS — F17200 Nicotine dependence, unspecified, uncomplicated: Secondary | ICD-10-CM | POA: Diagnosis present

## 2013-04-30 DIAGNOSIS — K819 Cholecystitis, unspecified: Principal | ICD-10-CM | POA: Diagnosis present

## 2013-04-30 DIAGNOSIS — E7800 Pure hypercholesterolemia, unspecified: Secondary | ICD-10-CM | POA: Diagnosis present

## 2013-04-30 DIAGNOSIS — Z5331 Laparoscopic surgical procedure converted to open procedure: Secondary | ICD-10-CM

## 2013-04-30 DIAGNOSIS — J4489 Other specified chronic obstructive pulmonary disease: Secondary | ICD-10-CM | POA: Diagnosis present

## 2013-04-30 DIAGNOSIS — I10 Essential (primary) hypertension: Secondary | ICD-10-CM | POA: Diagnosis present

## 2013-04-30 DIAGNOSIS — E039 Hypothyroidism, unspecified: Secondary | ICD-10-CM | POA: Diagnosis present

## 2013-04-30 HISTORY — PX: CHOLECYSTECTOMY: SHX55

## 2013-04-30 SURGERY — LAPAROSCOPIC CHOLECYSTECTOMY
Anesthesia: General | Site: Abdomen | Wound class: Contaminated

## 2013-04-30 MED ORDER — MIDAZOLAM HCL 2 MG/2ML IJ SOLN
1.0000 mg | INTRAMUSCULAR | Status: DC | PRN
  Administered 2013-04-30: 2 mg via INTRAVENOUS

## 2013-04-30 MED ORDER — MIDAZOLAM HCL 2 MG/2ML IJ SOLN
INTRAMUSCULAR | Status: AC
  Filled 2013-04-30: qty 2

## 2013-04-30 MED ORDER — ACETAMINOPHEN 325 MG PO TABS
325.0000 mg | ORAL_TABLET | Freq: Four times a day (QID) | ORAL | Status: DC | PRN
  Administered 2013-05-01: 325 mg via ORAL
  Filled 2013-04-30: qty 1

## 2013-04-30 MED ORDER — FENTANYL CITRATE 0.05 MG/ML IJ SOLN: 25.0000 ug | INTRAMUSCULAR | Status: DC | PRN

## 2013-04-30 MED ORDER — BENAZEPRIL HCL 10 MG PO TABS
10.0000 mg | ORAL_TABLET | Freq: Every day | ORAL | Status: DC
  Administered 2013-05-01 – 2013-05-02 (×2): 10 mg via ORAL
  Filled 2013-04-30 (×2): qty 1

## 2013-04-30 MED ORDER — NEOSTIGMINE METHYLSULFATE 1 MG/ML IJ SOLN
INTRAMUSCULAR | Status: DC | PRN
  Administered 2013-04-30: 3 mg via INTRAVENOUS

## 2013-04-30 MED ORDER — LACTATED RINGERS IV SOLN
INTRAVENOUS | Status: DC
Start: 2013-04-30 — End: 2013-04-30
  Administered 2013-04-30 (×2): via INTRAVENOUS

## 2013-04-30 MED ORDER — FENTANYL CITRATE 0.05 MG/ML IJ SOLN
INTRAMUSCULAR | Status: DC | PRN
  Administered 2013-04-30 (×7): 50 ug via INTRAVENOUS

## 2013-04-30 MED ORDER — GLYCOPYRROLATE 0.2 MG/ML IJ SOLN
INTRAMUSCULAR | Status: DC | PRN
  Administered 2013-04-30: .6 mg via INTRAVENOUS

## 2013-04-30 MED ORDER — ALBUTEROL SULFATE HFA 108 (90 BASE) MCG/ACT IN AERS: 2.0000 | INHALATION_SPRAY | Freq: Four times a day (QID) | RESPIRATORY_TRACT | Status: DC | PRN

## 2013-04-30 MED ORDER — DEXTROSE 5 % IV SOLN
INTRAVENOUS | Status: AC
  Filled 2013-04-30: qty 2

## 2013-04-30 MED ORDER — EPHEDRINE SULFATE 50 MG/ML IJ SOLN
INTRAMUSCULAR | Status: DC | PRN
  Administered 2013-04-30 (×2): 5 mg via INTRAVENOUS

## 2013-04-30 MED ORDER — METOPROLOL TARTRATE 25 MG PO TABS
25.0000 mg | ORAL_TABLET | Freq: Two times a day (BID) | ORAL | Status: DC
  Administered 2013-05-01 – 2013-05-02 (×3): 25 mg via ORAL
  Filled 2013-04-30 (×3): qty 1

## 2013-04-30 MED ORDER — PROPOFOL 10 MG/ML IV BOLUS
INTRAVENOUS | Status: DC | PRN
  Administered 2013-04-30: 150 mg via INTRAVENOUS

## 2013-04-30 MED ORDER — SUCCINYLCHOLINE CHLORIDE 20 MG/ML IJ SOLN
INTRAMUSCULAR | Status: AC
  Filled 2013-04-30: qty 1

## 2013-04-30 MED ORDER — POTASSIUM CHLORIDE IN NACL 20-0.9 MEQ/L-% IV SOLN
INTRAVENOUS | Status: DC
Start: 2013-04-30 — End: 2013-05-02
  Administered 2013-04-30: 19:00:00 via INTRAVENOUS

## 2013-04-30 MED ORDER — GLYCOPYRROLATE 0.2 MG/ML IJ SOLN
INTRAMUSCULAR | Status: AC
  Filled 2013-04-30: qty 3

## 2013-04-30 MED ORDER — SUCCINYLCHOLINE CHLORIDE 20 MG/ML IJ SOLN
INTRAMUSCULAR | Status: DC | PRN
  Administered 2013-04-30: 110 mg via INTRAVENOUS

## 2013-04-30 MED ORDER — ONDANSETRON HCL 4 MG/2ML IJ SOLN
INTRAMUSCULAR | Status: AC
  Filled 2013-04-30: qty 2

## 2013-04-30 MED ORDER — FENTANYL CITRATE 0.05 MG/ML IJ SOLN
INTRAMUSCULAR | Status: AC
  Filled 2013-04-30: qty 5

## 2013-04-30 MED ORDER — ONDANSETRON HCL 4 MG PO TABS
4.0000 mg | ORAL_TABLET | Freq: Four times a day (QID) | ORAL | Status: DC | PRN
  Administered 2013-05-01: 4 mg via ORAL
  Filled 2013-04-30: qty 1

## 2013-04-30 MED ORDER — DEXTROSE 5 % IV SOLN
2.0000 g | Freq: Once | INTRAVENOUS | Status: AC
  Administered 2013-04-30: 2 g via INTRAVENOUS

## 2013-04-30 MED ORDER — ONDANSETRON HCL 4 MG/2ML IJ SOLN
4.0000 mg | Freq: Four times a day (QID) | INTRAMUSCULAR | Status: DC | PRN
  Administered 2013-04-30: 4 mg via INTRAVENOUS
  Filled 2013-04-30: qty 2

## 2013-04-30 MED ORDER — WATER FOR IRRIGATION, STERILE IR SOLN
Status: DC | PRN
  Administered 2013-04-30 (×2): 1000 mL

## 2013-04-30 MED ORDER — LIDOCAINE HCL (PF) 1 % IJ SOLN
INTRAMUSCULAR | Status: AC
  Filled 2013-04-30: qty 5

## 2013-04-30 MED ORDER — FAMOTIDINE IN NACL 20-0.9 MG/50ML-% IV SOLN
20.0000 mg | Freq: Two times a day (BID) | INTRAVENOUS | Status: DC
  Administered 2013-04-30 – 2013-05-01 (×3): 20 mg via INTRAVENOUS
  Filled 2013-04-30 (×6): qty 50

## 2013-04-30 MED ORDER — LIDOCAINE HCL (CARDIAC) 10 MG/ML IV SOLN
INTRAVENOUS | Status: DC | PRN
  Administered 2013-04-30: 40 mg via INTRAVENOUS

## 2013-04-30 MED ORDER — ATORVASTATIN CALCIUM 40 MG PO TABS
40.0000 mg | ORAL_TABLET | Freq: Every day | ORAL | Status: DC
  Administered 2013-04-30 – 2013-05-01 (×2): 40 mg via ORAL
  Filled 2013-04-30 (×2): qty 1

## 2013-04-30 MED ORDER — BUPIVACAINE HCL (PF) 0.5 % IJ SOLN
INTRAMUSCULAR | Status: AC
  Filled 2013-04-30: qty 30

## 2013-04-30 MED ORDER — ROCURONIUM BROMIDE 50 MG/5ML IV SOLN
INTRAVENOUS | Status: AC
  Filled 2013-04-30: qty 1

## 2013-04-30 MED ORDER — ONDANSETRON HCL 4 MG/2ML IJ SOLN
4.0000 mg | Freq: Once | INTRAMUSCULAR | Status: AC
  Administered 2013-04-30: 4 mg via INTRAVENOUS

## 2013-04-30 MED ORDER — EPHEDRINE SULFATE 50 MG/ML IJ SOLN
INTRAMUSCULAR | Status: AC
  Filled 2013-04-30: qty 1

## 2013-04-30 MED ORDER — BUPIVACAINE HCL (PF) 0.5 % IJ SOLN
INTRAMUSCULAR | Status: DC | PRN
  Administered 2013-04-30: 10 mL

## 2013-04-30 MED ORDER — PROPOFOL 10 MG/ML IV EMUL
INTRAVENOUS | Status: AC
  Filled 2013-04-30: qty 20

## 2013-04-30 MED ORDER — LEVOTHYROXINE SODIUM 88 MCG PO TABS
88.0000 ug | ORAL_TABLET | Freq: Every day | ORAL | Status: DC
  Administered 2013-05-01 – 2013-05-02 (×2): 88 ug via ORAL
  Filled 2013-04-30 (×4): qty 1

## 2013-04-30 MED ORDER — FAMOTIDINE IN NACL 20-0.9 MG/50ML-% IV SOLN
INTRAVENOUS | Status: AC
  Filled 2013-04-30: qty 50

## 2013-04-30 MED ORDER — SODIUM CHLORIDE 0.9 % IR SOLN
Status: DC | PRN
  Administered 2013-04-30: 1000 mL
  Administered 2013-04-30: 3000 mL

## 2013-04-30 MED ORDER — ROCURONIUM BROMIDE 100 MG/10ML IV SOLN
INTRAVENOUS | Status: DC | PRN
  Administered 2013-04-30: 22 mg via INTRAVENOUS
  Administered 2013-04-30: 8 mg via INTRAVENOUS
  Administered 2013-04-30: 10 mg via INTRAVENOUS

## 2013-04-30 MED ORDER — MORPHINE SULFATE 2 MG/ML IJ SOLN
1.0000 mg | INTRAMUSCULAR | Status: DC | PRN
  Administered 2013-04-30 – 2013-05-01 (×5): 1 mg via INTRAVENOUS
  Filled 2013-04-30 (×5): qty 1

## 2013-04-30 MED ORDER — ONDANSETRON HCL 4 MG/2ML IJ SOLN
4.0000 mg | Freq: Once | INTRAMUSCULAR | Status: AC | PRN
  Administered 2013-04-30: 4 mg via INTRAVENOUS

## 2013-04-30 SURGICAL SUPPLY — 74 items
APPLICATOR COTTON TIP 6IN STRL (MISCELLANEOUS) ×2 IMPLANT
APPLIER CLIP 11 MED OPEN (CLIP) ×2
APPLIER CLIP LAPSCP 10X32 DD (CLIP) ×2 IMPLANT
APR CLP MED 11 20 MLT OPN (CLIP) ×1
ATTRACTOMAT 16X20 MAGNETIC DRP (DRAPES) ×1 IMPLANT
BAG HAMPER (MISCELLANEOUS) ×2 IMPLANT
BAG SPEC RTRVL LRG 6X4 10 (ENDOMECHANICALS) ×1
BLADE SURG 15 STRL LF DISP TIS (BLADE) ×1 IMPLANT
BLADE SURG 15 STRL SS (BLADE) ×4
BLADE SURG SZ10 CARB STEEL (BLADE) ×2 IMPLANT
CLEANER TIP ELECTROSURG 2X2 (MISCELLANEOUS) ×1 IMPLANT
CLIP APPLIE 11 MED OPEN (CLIP) IMPLANT
CLOTH BEACON ORANGE TIMEOUT ST (SAFETY) ×2 IMPLANT
COVER LIGHT HANDLE STERIS (MISCELLANEOUS) ×4 IMPLANT
DECANTER SPIKE VIAL GLASS SM (MISCELLANEOUS) ×2 IMPLANT
DISSECTOR BLUNT TIP ENDO 5MM (MISCELLANEOUS) ×2 IMPLANT
DRSG TEGADERM 2-3/8X2-3/4 SM (GAUZE/BANDAGES/DRESSINGS) ×6 IMPLANT
ELECT BLADE 6 FLAT ULTRCLN (ELECTRODE) ×1 IMPLANT
ELECT REM PT RETURN 9FT ADLT (ELECTROSURGICAL) ×2
ELECTRODE REM PT RTRN 9FT ADLT (ELECTROSURGICAL) ×1 IMPLANT
EVACUATOR DRAINAGE 10X20 100CC (DRAIN) ×1 IMPLANT
EVACUATOR SILICONE 100CC (DRAIN) ×2
FILTER SMOKE EVAC LAPAROSHD (FILTER) ×2 IMPLANT
FORMALIN 10 PREFIL 120ML (MISCELLANEOUS) ×2 IMPLANT
GLOVE BIOGEL PI IND STRL 7.0 (GLOVE) IMPLANT
GLOVE BIOGEL PI INDICATOR 7.0 (GLOVE) ×2
GLOVE ECLIPSE 6.5 STRL STRAW (GLOVE) ×2 IMPLANT
GLOVE EXAM NITRILE LRG STRL (GLOVE) ×1 IMPLANT
GLOVE EXAM NITRILE MD LF STRL (GLOVE) ×1 IMPLANT
GLOVE SKINSENSE NS SZ7.0 (GLOVE) ×2
GLOVE SKINSENSE STRL SZ7.0 (GLOVE) ×1 IMPLANT
GLOVE SS BIOGEL STRL SZ 6.5 (GLOVE) IMPLANT
GLOVE SUPERSENSE BIOGEL SZ 6.5 (GLOVE) ×2
GOWN STRL REIN XL XLG (GOWN DISPOSABLE) ×6 IMPLANT
HEMOSTAT SURGICEL 4X8 (HEMOSTASIS) ×2 IMPLANT
INST SET LAPROSCOPIC AP (KITS) ×2 IMPLANT
IV NS IRRIG 3000ML ARTHROMATIC (IV SOLUTION) ×2 IMPLANT
KIT ROOM TURNOVER APOR (KITS) ×2 IMPLANT
MANIFOLD NEPTUNE II (INSTRUMENTS) ×2 IMPLANT
NS IRRIG 1000ML POUR BTL (IV SOLUTION) ×3 IMPLANT
PACK LAP CHOLE LZT030E (CUSTOM PROCEDURE TRAY) ×2 IMPLANT
PAD ARMBOARD 7.5X6 YLW CONV (MISCELLANEOUS) ×2 IMPLANT
PENCIL HANDSWITCHING (ELECTRODE) ×1 IMPLANT
POUCH SPECIMEN RETRIEVAL 10MM (ENDOMECHANICALS) ×2 IMPLANT
SET BASIN LINEN APH (SET/KITS/TRAYS/PACK) ×2 IMPLANT
SET TUBE IRRIG SUCTION NO TIP (IRRIGATION / IRRIGATOR) ×2 IMPLANT
SOL PREP PROV IODINE SCRUB 4OZ (MISCELLANEOUS) ×2 IMPLANT
SPONGE DRAIN TRACH 4X4 STRL 2S (GAUZE/BANDAGES/DRESSINGS) ×2 IMPLANT
SPONGE GAUZE 4X4 12PLY (GAUZE/BANDAGES/DRESSINGS) ×2 IMPLANT
SPONGE INTESTINAL PEANUT (DISPOSABLE) ×2 IMPLANT
SPONGE LAP 18X18 X RAY DECT (DISPOSABLE) ×2 IMPLANT
STAPLER VISISTAT 35W (STAPLE) ×2 IMPLANT
SUCTION POOLE TIP (SUCTIONS) ×1 IMPLANT
SUT ETHILON 3 0 FSL (SUTURE) ×2 IMPLANT
SUT SILK 2 0 (SUTURE) ×2
SUT SILK 2 0 SH (SUTURE) IMPLANT
SUT SILK 2-0 18XBRD TIE 12 (SUTURE) IMPLANT
SUT SILK 3 0 SH CR/8 (SUTURE) ×1 IMPLANT
SUT VIC AB 0 CT1 27 (SUTURE) ×6
SUT VIC AB 0 CT1 27XBRD ANTBC (SUTURE) IMPLANT
SUT VIC AB 0 CT1 27XCR 8 STRN (SUTURE) IMPLANT
SUT VICRYL 0 UR6 27IN ABS (SUTURE) ×3 IMPLANT
SYR BULB IRRIGATION 50ML (SYRINGE) ×1 IMPLANT
TAPE CLOTH SURG 4X10 WHT LF (GAUZE/BANDAGES/DRESSINGS) ×1 IMPLANT
TOWEL OR 17X26 4PK STRL BLUE (TOWEL DISPOSABLE) ×2 IMPLANT
TRAY FOLEY CATH 16FR SILVER (SET/KITS/TRAYS/PACK) ×2 IMPLANT
TROCAR Z-THRD FIOS HNDL 11X100 (TROCAR) ×2 IMPLANT
TROCAR Z-THREAD FIOS 11X100 BL (TROCAR) ×2 IMPLANT
TROCAR Z-THREAD FIOS 5X100MM (TROCAR) ×2 IMPLANT
TROCAR Z-THREAD OPTICAL 5X100M (TROCAR) ×2 IMPLANT
TUBING INSUF HEATED (TUBING) ×2 IMPLANT
WARMER LAPAROSCOPE (MISCELLANEOUS) ×2 IMPLANT
WATER STERILE IRR 1000ML POUR (IV SOLUTION) ×4 IMPLANT
YANKAUER SUCT BULB TIP 10FT TU (MISCELLANEOUS) ×1 IMPLANT

## 2013-04-30 NOTE — Brief Op Note (Signed)
04/30/2013  4:28 PM  PATIENT:  Mancel Bale  72 y.o. female  PRE-OPERATIVE DIAGNOSIS:  porcelain gallbladder  POST-OPERATIVE DIAGNOSIS:  porcelain gallbladder  PROCEDURE:  Procedure(s) with comments: Attempted LAPAROSCOPIC CHOLECYSTECTOMY ; converted to open procedure @ 1510 (N/A) CHOLECYSTECTOMY (N/A) - procedure started @ 1520  SURGEON:  Surgeon(s) and Role:    * Marlane Hatcher, MD - Primary  PHYSICIAN ASSISTANT:   ASSISTANTS: none   ANESTHESIA:   general  EBL:  Total I/O In: 1000 [I.V.:1000] Out: 200 [Urine:200]  BLOOD ADMINISTERED:none  DRAINS: Penrose drain in the in liver bed.   LOCAL MEDICATIONS USED:  MARCAINE  0.5%  ~  10 cc.   SPECIMEN:  Source of Specimen:  gall bladder.  DISPOSITION OF SPECIMEN:  PATHOLOGY  COUNTS:  YES  TOURNIQUET:  * No tourniquets in log *  DICTATION: .Other Dictation: Dictation Number OR dict. #  [id number].  PLAN OF CARE: Admit to inpatient   PATIENT DISPOSITION:  PACU - hemodynamically stable.   Delay start of Pharmacological VTE agent (>24hrs) due to surgical blood loss or risk of bleeding: not applicable

## 2013-04-30 NOTE — Transfer of Care (Signed)
Immediate Anesthesia Transfer of Care Note  Patient: [name]  Procedure(s) Performed: Procedure(s) with comments: Attempted LAPAROSCOPIC CHOLECYSTECTOMY ; converted to open procedure @ 1510 (N/A) CHOLECYSTECTOMY (N/A) - procedure started @ 1520  Patient Location: PACU  Anesthesia Type:General  Level of Consciousness: awake and alert   Airway & Oxygen Therapy: Patient Spontanous Breathing and Patient connected to face mask oxygen  Post-op Assessment: Report given to PACU RN  Post vital signs: Reviewed and stable  Complications: No apparent anesthesia complications

## 2013-04-30 NOTE — Progress Notes (Signed)
81 yr. Old W female with a porcelain gall bladder for laparoscopic removal.  Procedure and risks explained in detail and informed consent obtained.  Pt has been cleared for surgery medically.  Labs reviewed and all questions answered.  Pt did not however cut back on her smoking despite being so advised.  No significant change in H&P, dict. # [id number].  Filed Vitals:   04/30/13 1405  BP: 129/60  Temp:   Resp: 16  temp 97.5, pulse 61  O2 sat 99% on RA.

## 2013-04-30 NOTE — Progress Notes (Signed)
Post OP Check  Filed Vitals:   04/30/13 1745  BP: 165/72  Pulse: 60  Temp: 97.6 F (36.4 C)  Resp: 14    Awake and alert.  Some incisional pain as expected.  Dark JP drainage from bloody surgicel.  Dressings otherwise dry and intact.  Voiding with foley and Ng in place.  Have asked Dr. [name] to review meds and follow medically.  Pt. Looks good post op.

## 2013-04-30 NOTE — Anesthesia Procedure Notes (Signed)
Procedure Name: Intubation Date/Time: 04/30/2013 2:39 PM Performed by: Glynn Octave E Pre-anesthesia Checklist: Patient identified, Patient being monitored, Timeout performed, Emergency Drugs available and Suction available Patient Re-evaluated:Patient Re-evaluated prior to inductionOxygen Delivery Method: Circle System Utilized Preoxygenation: Pre-oxygenation with 100% oxygen Intubation Type: IV induction, Rapid sequence and Cricoid Pressure applied Laryngoscope Size: Mac and 3 Grade View: Grade I Tube type: Oral Tube size: 7.0 mm Number of attempts: 1 Airway Equipment and Method: stylet Placement Confirmation: ETT inserted through vocal cords under direct vision,  positive ETCO2 and breath sounds checked- equal and bilateral Secured at: 21 cm Tube secured with: Tape Dental Injury: Teeth and Oropharynx as per pre-operative assessment

## 2013-04-30 NOTE — Anesthesia Preprocedure Evaluation (Signed)
Anesthesia Evaluation  Patient identified by MRN, date of birth, ID band Patient awake    Reviewed: Allergy & Precautions, H&P , NPO status , Patient's Chart, lab work & pertinent test results, reviewed documented beta blocker date and time   Airway Mallampati: II TM Distance: >3 FB     Dental  (+) Teeth Intact and Partial Lower   Pulmonary shortness of breath and with exertion, COPDCurrent Smoker,  breath sounds clear to auscultation        Cardiovascular hypertension, Pt. on medications + dysrhythmias (palpitations, beta blockers) Rhythm:Regular Rate:Normal     Neuro/Psych    GI/Hepatic GERD-  Medicated and Controlled,  Endo/Other    Renal/GU      Musculoskeletal   Abdominal   Peds  Hematology   Anesthesia Other Findings   Reproductive/Obstetrics                           Anesthesia Physical Anesthesia Plan  ASA: III  Anesthesia Plan: General   Post-op Pain Management:    Induction: Intravenous, Rapid sequence and Cricoid pressure planned  Airway Management Planned: Oral ETT  Additional Equipment:   Intra-op Plan:   Post-operative Plan: Extubation in OR  Informed Consent: I have reviewed the patients History and Physical, chart, labs and discussed the procedure including the risks, benefits and alternatives for the proposed anesthesia with the patient or authorized representative who has indicated his/her understanding and acceptance.     Plan Discussed with:   Anesthesia Plan Comments:         Anesthesia Quick Evaluation

## 2013-04-30 NOTE — Anesthesia Postprocedure Evaluation (Signed)
  Anesthesia Post-op Note  Patient: [name]  Procedure(s) Performed: Procedure(s) with comments: Attempted LAPAROSCOPIC CHOLECYSTECTOMY ; converted to open procedure @ 1510 (N/A) CHOLECYSTECTOMY (N/A) - procedure started @ 1520  Patient Location: PACU  Anesthesia Type:General  Level of Consciousness: awake, alert  and oriented  Airway and Oxygen Therapy: Patient Spontanous Breathing and Patient connected to face mask oxygen  Post-op Pain: moderate  Post-op Assessment: Post-op Vital signs reviewed, Patient's Cardiovascular Status Stable, Respiratory Function Stable, Patent Airway and No signs of Nausea or vomiting  Post-op Vital Signs: Reviewed and stable  Complications: No apparent anesthesia complications

## 2013-05-01 LAB — HEPATIC FUNCTION PANEL
ALT: 33 U/L (ref 0–35)
AST: 39 U/L — ABNORMAL HIGH (ref 0–37)
Indirect Bilirubin: 0.4 mg/dL (ref 0.3–0.9)
Total Protein: 6.3 g/dL (ref 6.0–8.3)

## 2013-05-01 LAB — CBC
HCT: 38 % (ref 36.0–46.0)
Hemoglobin: 12.3 g/dL (ref 12.0–15.0)
MCH: 29.2 pg (ref 26.0–34.0)
MCV: 90.3 fL (ref 78.0–100.0)
RBC: 4.21 MIL/uL (ref 3.87–5.11)

## 2013-05-01 LAB — BASIC METABOLIC PANEL
BUN: 9 mg/dL (ref 6–23)
CO2: 29 mEq/L (ref 19–32)
Creatinine, Ser: 0.72 mg/dL (ref 0.50–1.10)
GFR calc Af Amer: >90 mL/min (ref >90)
GFR calc non Af Amer: 84 mL/min — ABNORMAL LOW (ref >90)
Glucose, Bld: 134 mg/dL — ABNORMAL HIGH (ref 70–99)
Sodium: 139 mEq/L (ref 135–145)

## 2013-05-01 NOTE — Progress Notes (Signed)
POD # 1  Filed Vitals:   05/01/13 1124  BP:   Pulse: 68  Temp:   Resp: 16  Afebrile Abdomen is soft bowel sounds present Wounds clean and redressed. Serous drainage from JP, will remove prior to discharge   Clear lquid diet will D/c NG and foley.  Labs acceptable.  Good progress.

## 2013-05-01 NOTE — Progress Notes (Signed)
[name], [name]                  ACCOUNT NO.:  [id number]  MEDICAL RECORD NO.:  [id number]  LOCATION:  A320                          FACILITY:  [facility]  PHYSICIAN:  Mila Homer. [name], M.D.DATE OF BIRTH:  1941-03-02  DATE OF PROCEDURE: DATE OF DISCHARGE:                                PROGRESS NOTE   SUBJECTIVE:  She feels about the same today.  She did not sleep well last night.  She is concerned about her regular medications since she has a NG tube and is not currently on these.  OBJECTIVE:  VITAL SIGNS:  Temperature is 97.7, pulse 67, respiratory rate 16, blood pressure 154/65. She is supine in bed.  NG tube is in place.  Her husband is in attendance. Speech is normal, and her sensorium is intact. Her heart has a regular rhythm, rate of 70 and her lungs are clear throughout.  The abdomen appears to be soft.  There is no edema of the ankles.  Her CBC and CMP were normal today except for slightly low albumin.  ASSESSMENT: 1. Day #1 postop cholecystectomy. 2. Benign essential hypertension. 3. Hyperlipidemia. 4. Anxiety. 5. Hypothyroidism.  PLAN:  I talked her about oral meds.  Most of these can be put on hold for several days without a problem.  Her blood pressure will be followed carefully and treated IV if it gets out of control.  Currently, she is only using alprazolam at home very occasionally so that will not be a problem being off it.     Mila Homer. [name], M.D.     SDK/MEDQ  D:  05/01/2013  T:  05/01/2013  Job:  [id number]

## 2013-05-01 NOTE — Progress Notes (Signed)
[name], GLUTH                  ACCOUNT NO.:  [id number]  MEDICAL RECORD NO.:  [id number]  LOCATION:  A320                          FACILITY:  [facility]  PHYSICIAN:  Mila Homer. [name], M.D.DATE OF BIRTH:  24-Aug-1941  DATE OF PROCEDURE: DATE OF DISCHARGE:                                PROGRESS NOTE   SUBJECTIVE:  This is a 72 year old who presented with backache and was found to have a porcelain gallbladder.  I referred her to her family general surgeon, Dr. Malvin Johns, who performed an open cholecystectomy today.  MEDICAL PROBLEMS:  Hypothyroidism, tobacco use disorder, hyperlipidemia, osteoporosis, reflux esophagitis, colonic diverticulosis, carotid artery stenosis, obesity, type 2 diabetes, COPD, allergic rhinitis, and obesity to overweight.  He has also had palpitations some years back.  CURRENT MEDICATIONS:  Based upon the medical record in the office include acyclovir 200 mg as needed, albuterol by nebulizer, alprazolam 0.5 mg up to q.i.d. p.r.n. anxiety, atorvastatin 40 mg every other day, benazepril 10 mg daily, Caltrate 600 + 800 every other day, cetirizine 10 mg daily for allergy, calcium daily, levothyroxine 88 mcg daily, Toprol 50 mg half a tablet b.i.d., omeprazole 20 mg b.i.d., and Ventolin HFA 2 puffs q.4 hours for breathing.  OBJECTIVE:  GENERAL:  She is supine in bed, having just arrived to the hospital room after being postop.  She is oriented and alert.  Her husband and the family member in the room with her.  Her color is good. She is breathing well.  Speech is normal. VITAL SIGNS:  Temperature is 97.6, pulse 60, respiratory rate 14, blood pressure 165/72. HEART:  Her heart has a regular rhythm with a rate of 60. LUNGS:  Clear throughout.  She is moving air well.  She has no edema of the ankles.  ASSESSMENT: 1. Porcelain gallbladder status post open cholecystectomy. 2. Benign essential hypertension. 3. Hyperlipidemia. 4. Anxiety. 5.  Hypothyroidism.  PLAN:  Right now, she has an NG tube and a Foley catheter.  She may have some of her meds p.o. with the NG tube clamped for an hour or two. Hopefully, she will have the NG tube removed tomorrow and meds given p.o.  I have discussed all this with the patient and Dr. Malvin Johns.     Mila Homer. [name], M.D.     SDK/MEDQ  D:  04/30/2013  T:  05/01/2013  Job:  [id number]

## 2013-05-01 NOTE — Anesthesia Postprocedure Evaluation (Signed)
Anesthesia Post Note  Patient: [name]  Procedure(s) Performed: Procedure(s) (LRB): Attempted LAPAROSCOPIC CHOLECYSTECTOMY ; converted to open procedure @ 1510 (N/A) CHOLECYSTECTOMY (N/A)  Anesthesia type: General  Patient location: 320  Post pain: Pain level controlled  Post assessment: Post-op Vital signs reviewed, Patient's Cardiovascular Status Stable, Respiratory Function Stable, Patent rway, No signs of Nausea or vomiting and Pain level controlled  Post vital signs: Reviewed and stable  Level of consciousness: sleepy  Complications: No apparent anesthesia complications

## 2013-05-01 NOTE — Op Note (Signed)
NAMEOLIVENE, COOKSTON NO.:  [id number]  MEDICAL RECORD NO.:  [id number]  LOCATION:  A320                          FACILITY:  [facility]  PHYSICIAN:  Barbaraann Barthel, M.D. DATE OF BIRTH:  Feb 03, 1941  DATE OF PROCEDURE:  04/30/2013 DATE OF DISCHARGE:                              OPERATIVE REPORT   SURGEON:  Barbaraann Barthel, M.D.  PREOPERATIVE DIAGNOSIS:  Porcelain gallbladder (pathology pending).  POSTOPERATIVE DIAGNOSIS:  Porcelain gallbladder (pathology pending). Cholecystitis.  NOTE:  This is a 72 year old white female who was worked up for abdominal pain and was found to have a porcelain gallbladder on CT scan. No other abnormalities were encountered on CT scan, and her liver function studies were within normal limits preoperatively.  We discussed the need for cholecystectomy due to the high incidence of cancer of the gallbladder associated with porcelain gallbladder, and this was explained to the patient as well as her husband.  GROSS OPERATIVE FINDINGS:  The fundus of the gallbladder, down about 3/4 was as hard as stone, and we were unable to grasp it in order to do any dissection safely, so I had to convert to an open cholecystectomy. There was a cloudy abnormal fluid within the gallbladder.  I suspect this was a nonfunctioning gallbladder.  WOUND CLASSIFICATION:  Contaminated.  SPECIMEN:  Gallbladder.  TECHNIQUE:  The patient was placed in the supine position, and after the adequate administration of general anesthesia via endotracheal intubation, her entire abdomen was prepped with Betadine solution and draped in the usual manner.  An NG tube was placed as was a Foley catheter aseptically.  After prepping and draping in the usual manner, a periumbilical incision was carried out over the superior aspect of the umbilicus.  The fascia was grasped with a sharp towel clip, and the Veress needle was inserted and confirmed in position with a saline  drop test.  I then insufflated the abdomen still about 15 mmHg.  We then, under direct vision, placed a 11 mm cannula in the epigastrium and two 5 mm cannulas in the right upper quadrant laterally.  There were adhesions about the gallbladder, and we were able to take these down; however, we were unable to grasp the gallbladder in any way that we were able to remove the gallbladder safely due to our inability to dissect with any counter traction.  I fairly rapidly made the decision to convert to an open gallbladder.  A right subcostal incision was then carried out through skin, subcutaneous tissue, through the rectus muscle, which was divided with a cautery device.  The gallbladder was then grasped with a sharp towel clip, and we were then able to remove this from the liver bed, down to the cystic duct, which was of normal caliber.  This was ligated with 2-0 silk.  No cholangiogram was performed.  The cystic artery was also doubly silver clipped and divided.  The gallbladder was then removed from the liver bed, cauterizing the entire liver bed and removing any mesentery that the gallbladder had with it.  There was a lymph node attached to the gallbladder, this was removed as well.  The wound was then irrigated with normal  saline solution.  We then placed a Surgicel in the liver bed and a Jackson-Pratt drain was placed in the liver bed through one of the 5 mm cannula sites in the lateral part of the abdomen.  This was sutured in place with 3-0 nylon.  We then closed the peritoneum with a running 0 Vicryl suture, and the fascia with interrupted figure-of-eight 0 Vicryl sutures.  Approximately, 10 mL of 0.5% Sensorcaine was used for postoperative comfort.  The subcu was irrigated.  The skin was approximated with stapling device.  Likewise, the fascia was closed in the area of the umbilicus, and all surgical wounds were closed with stapling device.  Prior to closure, all sponge, needle, and  instrument counts were found to be correct.  Estimated blood loss was minimal.  The patient received about 1300 mL crystalloids intraoperatively.  There were no complications.  I will have Dr. [name] follow the patient medically as this is his patient.     Barbaraann Barthel, M.D.     WB/MEDQ  D:  04/30/2013  T:  05/01/2013  Job:  [id number]  cc:   Dr. Dustin Folks, M.D. Fax: [phone]

## 2013-05-01 NOTE — Care Management Note (Addendum)
    Page 1 of 1   05/02/2013     2:09:18 PM   CARE MANAGEMENT NOTE 05/02/2013  Patient:  [name], [name]   Account Number:  [id number]  Date Initiated:  05/01/2013  Documentation initiated by:  Sharrie Rothman  Subjective/Objective Assessment:   Pt admitted from home s/p open choley. Pt lives with her husband and will return home at discharge. Pt is independent with ADL's.     Action/Plan:   No CM needs noted.   Anticipated DC Date:  05/04/2013   Anticipated DC Plan:  HOME/SELF CARE      DC Planning Services  CM consult      Choice offered to / List presented to:             Status of service:  Completed, signed off Medicare Important Message given?  YES (If response is "NO", the following Medicare IM given date fields will be blank) Date Medicare IM given:  05/02/2013 Date Additional Medicare IM given:    Discharge Disposition:  HOME/SELF CARE  Per UR Regulation:    If discussed at Long Length of Stay Meetings, dates discussed:    Comments:  05/02/13 1410 Arlyss Queen, RN BSN CM Pt discharged home today. No CM needs noted.  05/01/13 1205 Arlyss Queen, RN BSN CM

## 2013-05-01 NOTE — Progress Notes (Signed)
Filed Vitals:   05/01/13 1441  BP: 141/55  Pulse: 71  Temp: 98 F (36.7 C)  Resp: 16    Continues to improve post op.  Voiding well without foley and tolerating PO liquids.  Abdomen remains soft. JP drainage remains serosanguinous.  Good progress post OP.

## 2013-05-01 NOTE — Progress Notes (Signed)
UR chart review completed.  

## 2013-05-02 LAB — CBC
HCT: 36.5 % (ref 36.0–46.0)
Hemoglobin: 11.7 g/dL — ABNORMAL LOW (ref 12.0–15.0)
MCH: 29.2 pg (ref 26.0–34.0)
MCHC: 32.1 g/dL (ref 30.0–36.0)
RBC: 4.01 MIL/uL (ref 3.87–5.11)

## 2013-05-02 LAB — BASIC METABOLIC PANEL
BUN: 6 mg/dL (ref 6–23)
CO2: 30 mEq/L (ref 19–32)
GFR calc non Af Amer: 88 mL/min — ABNORMAL LOW (ref >90)
Glucose, Bld: 136 mg/dL — ABNORMAL HIGH (ref 70–99)
Potassium: 4.1 mEq/L (ref 3.5–5.1)
Sodium: 139 mEq/L (ref 135–145)

## 2013-05-02 MED ORDER — ASPIRIN EC 81 MG PO TBEC: 81.0000 mg | DELAYED_RELEASE_TABLET | ORAL | Status: DC

## 2013-05-02 NOTE — Progress Notes (Signed)
POD # 2  Filed Vitals:   05/02/13 0517  BP: 163/70  Pulse: 69  Temp: 98.4 F (36.9 C)  Resp: 16    Pt feels much better. Tolerating PO well.  No leg pain dysuria or shortness of breath. Abdomen soft, serous drainage fro JP and I have redressed wound and removed drain.  Discharge and follow up arranged. Discharge dict.# [id number].

## 2013-05-02 NOTE — Progress Notes (Signed)
[name], [name]                  ACCOUNT NO.:  [id number]  MEDICAL RECORD NO.:  [id number]  LOCATION:  A320                          FACILITY:  [facility]  PHYSICIAN:  Mila Homer. [name], M.D.DATE OF BIRTH:  27-Sep-1940  DATE OF PROCEDURE: DATE OF DISCHARGE:                                PROGRESS NOTE   SUBJECTIVE:  She is feeling better today.  The NG tube and Foley were removed yesterday.  She is burping, but has not passed gas per rectum.  OBJECTIVE:  GENERAL:  She is walking in the hall.  She is in no acute distress.  Color is good.  Breathing easily. VITAL SIGNS:  Temperature 98.4, pulse 59, respiratory rate 16, blood pressure 163/70. HEART:  Has a regular rhythm.  Rate of 70. LUNGS:  Clear throughout.  She is moving air well.  LABORATORY DATA:  Blood work today showed a white cell count of 5400, hemoglobin 11.7, and a normal BMP.  ASSESSMENT: 1. Postoperative day 2, status post open cholecystectomy. 2. Benign essential hypertension. 3. Hypothyroidism. 4. Hyperlipidemia.  PLAN:  I talked to her.  Her surgeon may want to send her home today and then she can start back on her regular meds.  She continues with problems with passing gas or feces.  She will need to contact us if this persists.     Mila Homer. [name], M.D.     SDK/MEDQ  D:  05/02/2013  T:  05/02/2013  Job:  [id number]

## 2013-05-02 NOTE — Progress Notes (Signed)
Patient received discharge instructions along with follow up appointments. Patient verbalized understanding of all instructions. Patient was escorted by staff via wheelchair to vehicle. Patient discharged to home in stable condition. 

## 2013-05-03 ENCOUNTER — Encounter (HOSPITAL_COMMUNITY): Payer: Self-pay | Admitting: General Surgery

## 2013-05-03 NOTE — Discharge Summary (Signed)
[name], [name]                  ACCOUNT NO.:  [id number]  MEDICAL RECORD NO.:  [id number]  LOCATION:  A320                          FACILITY:  [facility]  PHYSICIAN:  Barbaraann Barthel, M.D. DATE OF BIRTH:  1941/08/28  DATE OF ADMISSION:  04/30/2013 DATE OF DISCHARGE:  08/13/2014LH                              DISCHARGE SUMMARY   DIAGNOSIS:  Porcelain gallbladder with cholecystitis present.  PROCEDURE:  On April 30, 2013, attempted laparoscopic cholecystectomy conversion to open cholecystectomy.  HOSPITAL COURSE:  Unremarkable.  This is a 72 year old white female patient of Dr. [name] who was noted to have on workup of flank pain and porcelain gallbladder.  She was taken to surgery via the outpatient department and I attempted laparoscopic cholecystectomy.  Unfortunately, gallbladder was so indurated that I could not grasp it enough to allow counter traction for a safe laparoscopic dissection and converted to an open procedure rather quickly when that was known and the patient then underwent an uneventful open cholecystectomy.  The hospital course was unremarkable.  On the first postoperative day, Foley and NG tube were removed.  She was supported medically by Dr. [name] and we made sure that her respiratory toilet was maintained as she is a heavy smoker and she was discharged on the second postoperative day at which time her wound was clean.  She had minimal Jackson-Pratt serous drainage, and the wound was clean, and she had no dysuria, shortness of breath, or leg pain.  She did quite well postoperatively.  Discharge instructions were given, and followup arrangements were made. She was told to contact me or come to the emergency room should there be any acute changes, and otherwise she is to follow up with Dr. [name] and also to follow up with her other doctors as needed, that would be Dr. Allyson Sabal and Dr. Danielle Dess.     Barbaraann Barthel, M.D.     WB/MEDQ  D:  05/02/2013   T:  05/03/2013  Job:  [id number]  cc:   Dr. Dustin Folks, M.D. Fax: [phone]  Mila Homer. [name], M.D. Fax: [phone]

## 2013-10-16 ENCOUNTER — Other Ambulatory Visit (HOSPITAL_COMMUNITY): Payer: Self-pay | Admitting: Family Medicine

## 2013-10-16 DIAGNOSIS — Z780 Asymptomatic menopausal state: Secondary | ICD-10-CM

## 2013-10-18 ENCOUNTER — Ambulatory Visit (HOSPITAL_COMMUNITY)
Admission: RE | Admit: 2013-10-18 | Discharge: 2013-10-18 | Disposition: A | Discharge status: Home / Self Care | Payer: Medicare Other | Source: Ambulatory Visit | Attending: Family Medicine | Admitting: Family Medicine

## 2013-10-18 DIAGNOSIS — M949 Disorder of cartilage, unspecified: Principal | ICD-10-CM

## 2013-10-18 DIAGNOSIS — M899 Disorder of bone, unspecified: Secondary | ICD-10-CM | POA: Insufficient documentation

## 2013-10-18 DIAGNOSIS — Z780 Asymptomatic menopausal state: Secondary | ICD-10-CM

## 2014-02-06 ENCOUNTER — Other Ambulatory Visit (HOSPITAL_COMMUNITY): Payer: Self-pay | Admitting: Family Medicine

## 2014-02-06 ENCOUNTER — Encounter: Payer: Self-pay | Admitting: Cardiovascular Disease

## 2014-02-06 ENCOUNTER — Ambulatory Visit (HOSPITAL_COMMUNITY)
Admission: RE | Admit: 2014-02-06 | Discharge: 2014-02-06 | Disposition: A | Discharge status: Home / Self Care | Payer: Medicare Other | Source: Ambulatory Visit | Attending: Family Medicine | Admitting: Family Medicine

## 2014-02-06 DIAGNOSIS — M25519 Pain in unspecified shoulder: Secondary | ICD-10-CM | POA: Insufficient documentation

## 2014-02-06 DIAGNOSIS — M25511 Pain in right shoulder: Secondary | ICD-10-CM

## 2014-04-10 ENCOUNTER — Ambulatory Visit (INDEPENDENT_AMBULATORY_CARE_PROVIDER_SITE_OTHER): Payer: Medicare Other | Admitting: Cardiovascular Disease

## 2014-04-10 ENCOUNTER — Encounter: Payer: Self-pay | Admitting: Cardiovascular Disease

## 2014-04-10 VITALS — BP 150/62 | HR 71 | Ht 65.0 in | Wt 167.8 lb

## 2014-04-10 DIAGNOSIS — E785 Hyperlipidemia, unspecified: Secondary | ICD-10-CM

## 2014-04-10 DIAGNOSIS — I739 Peripheral vascular disease, unspecified: Secondary | ICD-10-CM

## 2014-04-10 DIAGNOSIS — I10 Essential (primary) hypertension: Secondary | ICD-10-CM

## 2014-04-10 DIAGNOSIS — I779 Disorder of arteries and arterioles, unspecified: Secondary | ICD-10-CM

## 2014-04-10 NOTE — Progress Notes (Signed)
04/10/2014 [name]   1941-03-03  [id number]  Primary Physician Robert Bellow, MD Primary Cardiologist: Lorretta Harp MD [name]   HPI:  The patient is a very pleasant 73 year old mildly-overweight married Caucasian female, mother of 2 and grandmother of 3 grandchildren, whom I last saw in the office December 08, 2011. She has a history of moderate left internal carotid artery stenosis which we have been following by duplex ultrasound on an annual basis. She continues to smoke 10-15 cigarettes a day despite counseling to the contrary. I spent greater than 3 minutes today talking to her about this. Her other problems include hypertension and hyperlipidemia. She denies chest pain or shortness of breath. She had a Myoview performed November 27, 2008, which was nonischemic. Dr. Karie Kirks follows her lipid profile.     Current Outpatient Prescriptions  Medication Sig Dispense Refill  . acyclovir (ZOVIRAX) 200 MG capsule Take 200 mg by mouth daily as needed.      Marland Kitchen albuterol (PROVENTIL HFA;VENTOLIN HFA) 108 (90 BASE) MCG/ACT inhaler Inhale 2 puffs into the lungs every 6 (six) hours as needed for wheezing or shortness of breath.      Marland Kitchen aspirin EC 81 MG tablet Take 81 mg by mouth 2 (two) times a week.      Marland Kitchen atorvastatin (LIPITOR) 40 MG tablet Take 40 mg by mouth daily.      . benazepril (LOTENSIN) 10 MG tablet Take 10 mg by mouth daily.      Marland Kitchen levothyroxine (SYNTHROID, LEVOTHROID) 88 MCG tablet Take 88 mcg by mouth daily before breakfast.      . metoprolol (LOPRESSOR) 50 MG tablet Take 25 mg by mouth 2 (two) times daily.      Marland Kitchen omeprazole (PRILOSEC) 20 MG capsule Take 20 mg by mouth 2 (two) times daily.      Marland Kitchen ALPRAZolam (XANAX) 0.5 MG tablet Take 1 tablet by mouth at bedtime as needed.      . predniSONE (DELTASONE) 10 MG tablet Take 1 tablet by mouth daily.       No current facility-administered medications for this visit.    Allergies  Allergen Reactions  .  Codeine Nausea And Vomiting    History   Social History  . Marital Status: Married    Spouse Name: N/A    Number of Children: N/A  . Years of Education: N/A   Occupational History  . Not on file.   Social History Main Topics  . Smoking status: Current Every Day Smoker -- 55 years    Types: Cigarettes  . Smokeless tobacco: Not on file     Comment: patient has 1 cig every 2 days  . Alcohol Use: No  . Drug Use: No  . Sexual Activity: Yes    Birth Control/ Protection: Surgical   Other Topics Concern  . Not on file   Social History Narrative  . No narrative on file     Review of Systems: General: negative for chills, fever, night sweats or weight changes.  Cardiovascular: negative for chest pain, dyspnea on exertion, edema, orthopnea, palpitations, paroxysmal nocturnal dyspnea or shortness of breath Dermatological: negative for rash Respiratory: negative for cough or wheezing Urologic: negative for hematuria Abdominal: negative for nausea, vomiting, diarrhea, bright red blood per rectum, melena, or hematemesis Neurologic: negative for visual changes, syncope, or dizziness All other systems reviewed and are otherwise negative except as noted above.    Blood pressure 150/62, pulse 71, height 5\' 5"  (1.651 m),  weight 167 lb 12.8 oz (76.114 kg).  General appearance: alert and no distress Neck: no adenopathy, no JVD, supple, symmetrical, trachea midline, thyroid not enlarged, symmetric, no tenderness/mass/nodules and soft bilateral carotid bruits Lungs: clear to auscultation bilaterally Heart: regular rate and rhythm, S1, S2 normal, no murmur, click, rub or gallop Extremities: extremities normal, atraumatic, no cyanosis or edema  EKG normal sinus rhythm at 71 without ST or T wave changes  ASSESSMENT AND PLAN:   Carotid artery disease The patient had carotid Doppler studies performed one year ago that showed moderate left ICA stenosis. She had high-grade bilateral external  carotid asymptomatic on aspirin. We will recheck her carotid Doppler studies  Essential hypertension Controlled on current medications  Hyperlipidemia On statin therapy followed by her PCP      Lorretta Harp MD [facility] 04/10/2014 4:44 PM

## 2014-04-10 NOTE — Assessment & Plan Note (Signed)
On statin therapy followed by her PCP 

## 2014-04-10 NOTE — Assessment & Plan Note (Signed)
The patient had carotid Doppler studies performed one year ago that showed moderate left ICA stenosis. She had high-grade bilateral external carotid asymptomatic on aspirin. We will recheck her carotid Doppler studies

## 2014-04-10 NOTE — Assessment & Plan Note (Signed)
Controlled on current medications 

## 2014-04-10 NOTE — Patient Instructions (Signed)
Your physician has requested that you have a carotid duplex. This test is an ultrasound of the carotid arteries in your neck. It looks at blood flow through these arteries that supply the brain with blood. Allow one hour for this exam. There are no restrictions or special instructions.  Your physician recommends that you schedule a follow-up appointment in: One year with Dr.Berry

## 2014-04-12 ENCOUNTER — Encounter: Payer: Self-pay | Admitting: Cardiovascular Disease

## 2014-04-12 NOTE — Addendum Note (Signed)
Addended by: Vear Clock on: 04/12/2014 07:57 AM   Modules accepted: Orders

## 2014-04-15 ENCOUNTER — Telehealth: Payer: Self-pay | Admitting: *Deleted

## 2014-04-15 NOTE — Telephone Encounter (Signed)
Spoke with patient told her blood work was normal per Dr Gwenlyn Found, patient verbally understand.

## 2014-04-15 NOTE — Telephone Encounter (Signed)
Message copied by Vennie Homans on Mon Apr 15, 2014  5:21 PM ------      Message from: Lorretta Harp      Created: Mon Apr 15, 2014  8:36 AM       Call and tell normal ------

## 2014-04-26 ENCOUNTER — Ambulatory Visit (HOSPITAL_COMMUNITY)
Admission: RE | Admit: 2014-04-26 | Discharge: 2014-04-26 | Disposition: A | Discharge status: Home / Self Care | Payer: Medicare Other | Source: Ambulatory Visit | Attending: Cardiology | Admitting: Cardiology

## 2014-04-26 DIAGNOSIS — I739 Peripheral vascular disease, unspecified: Secondary | ICD-10-CM | POA: Insufficient documentation

## 2014-04-26 NOTE — Progress Notes (Signed)
Carotid Duplex Completed. °Brianna L Mazza,RVT °

## 2014-05-01 ENCOUNTER — Encounter: Payer: Self-pay | Admitting: *Deleted

## 2014-08-12 ENCOUNTER — Other Ambulatory Visit (HOSPITAL_COMMUNITY): Payer: Self-pay | Admitting: Family Medicine

## 2014-08-12 ENCOUNTER — Ambulatory Visit (HOSPITAL_COMMUNITY)
Admission: RE | Admit: 2014-08-12 | Discharge: 2014-08-12 | Disposition: A | Discharge status: Home / Self Care | Payer: Medicare Other | Source: Ambulatory Visit | Attending: Family Medicine | Admitting: Family Medicine

## 2014-08-12 DIAGNOSIS — M25551 Pain in right hip: Secondary | ICD-10-CM | POA: Diagnosis present

## 2014-09-24 NOTE — Patient Instructions (Signed)
[name]  09/24/2014   Your procedure is scheduled on:  09/30/2014  Report to [facility] at 6:30 AM.  Call this number if you have problems the morning of surgery: [phone]   Remember:   Do not eat food or drink liquids after midnight.   Take these medicines the morning of surgery with A SIP OF WATER: Albuterol (bring with you), Xanax, Lotensin, Synthroid, Metoprolol,  Prilosec   Do not wear jewelry, make-up or nail polish.  Do not wear lotions, powders, or perfumes. You may wear deodorant.  Do not shave 48 hours prior to surgery. Men may shave face and neck.  Do not bring valuables to the hospital.  [facility] is not responsible for any belongings or valuables.               Contacts, dentures or bridgework may not be worn into surgery.  Leave suitcase in the car. After surgery it may be brought to your room.  For patients admitted to the hospital, discharge time is determined by your treatment team.               Patients discharged the day of surgery will not be allowed to drive home.  Name and phone number of your driver:   Special Instructions: N/A   Please read over the following fact sheets that you were given: Anesthesia Post-op Instructions    PATIENT INSTRUCTIONS POST-ANESTHESIA  IMMEDIATELY FOLLOWING SURGERY:  Do not drive or operate machinery for the first twenty four hours after surgery.  Do not make any important decisions for twenty four hours after surgery or while taking narcotic pain medications or sedatives.  If you develop intractable nausea and vomiting or a severe headache please notify your doctor immediately.  FOLLOW-UP:  Please make an appointment with your surgeon as instructed. You do not need to follow up with anesthesia unless specifically instructed to do so.  WOUND CARE INSTRUCTIONS (if applicable):  Keep a dry clean dressing on the anesthesia/puncture wound site if there is drainage.  Once the wound has quit draining you may leave it open to  air.  Generally you should leave the bandage intact for twenty four hours unless there is drainage.  If the epidural site drains for more than 36-48 hours please call the anesthesia department.  QUESTIONS?:  Please feel free to call your physician or the hospital operator if you have any questions, and they will be happy to assist you.      Cataract A cataract is a clouding of the lens of the eye. When a lens becomes cloudy, vision is reduced based on the degree and nature of the clouding. Many cataracts reduce vision to some degree. Some cataracts make people more near-sighted as they develop. Other cataracts increase glare. Cataracts that are ignored and become worse can sometimes look white. The white color can be seen through the pupil. CAUSES   Aging. However, cataracts may occur at any age, even in newborns.  Certain drugs.  Trauma to the eye.  Certain diseases such as diabetes.  Specific eye diseases such as chronic inflammation inside the eye or a sudden attack of a rare form of glaucoma.  Inherited or acquired medical problems. SYMPTOMS   Gradual, progressive drop in vision in the affected eye.  Severe, rapid visual loss. This most often happens when trauma is the cause. DIAGNOSIS  To detect a cataract, an eye doctor examines the lens. Cataracts are best diagnosed with an exam of the  eyes with the pupils enlarged (dilated) by drops.  TREATMENT  For an early cataract, vision may improve by using different eyeglasses or stronger lighting. If that does not help your vision, surgery is the only effective treatment. A cataract needs to be surgically removed when vision loss interferes with your everyday activities, such as driving, reading, or watching TV. A cataract may also have to be removed if it prevents examination or treatment of another eye problem. Surgery removes the cloudy lens and usually replaces it with a substitute lens (intraocular lens, IOL).  At a time when both you  and your doctor agree, the cataract will be surgically removed. If you have cataracts in both eyes, only one is usually removed at a time. This allows the operated eye to heal and be out of danger from any possible problems after surgery (such as infection or poor wound healing). In rare cases, a cataract may be doing damage to your eye. In these cases, your caregiver may advise surgical removal right away. The vast majority of people who have cataract surgery have better vision afterward. HOME CARE INSTRUCTIONS  If you are not planning surgery, you may be asked to do the following:  Use different eyeglasses.  Use stronger or brighter lighting.  Ask your eye doctor about reducing your medicine dose or changing medicines if it is thought that a medicine caused your cataract. Changing medicines does not make the cataract go away on its own.  Become familiar with your surroundings. Poor vision can lead to injury. Avoid bumping into things on the affected side. You are at a higher risk for tripping or falling.  Exercise extreme care when driving or operating machinery.  Wear sunglasses if you are sensitive to bright light or experiencing problems with glare. SEEK IMMEDIATE MEDICAL CARE IF:   You have a worsening or sudden vision loss.  You notice redness, swelling, or increasing pain in the eye.  You have a fever. Document Released: 09/06/2005 Document Revised: 11/29/2011 Document Reviewed: 04/30/2011 [facility] Patient Information 2015 [place], [place]. This information is not intended to replace advice given to you by your health care provider. Make sure you discuss any questions you have with your health care provider.

## 2014-09-25 ENCOUNTER — Other Ambulatory Visit (HOSPITAL_COMMUNITY): Payer: Medicare Other

## 2014-09-25 ENCOUNTER — Encounter (HOSPITAL_COMMUNITY)
Admission: RE | Admit: 2014-09-25 | Discharge: 2014-09-25 | Disposition: A | Discharge status: Home / Self Care | Payer: Medicare Other | Source: Ambulatory Visit | Attending: Ophthalmology | Admitting: Ophthalmology

## 2014-09-25 ENCOUNTER — Encounter (HOSPITAL_COMMUNITY): Payer: Self-pay

## 2014-09-25 DIAGNOSIS — Z7982 Long term (current) use of aspirin: Secondary | ICD-10-CM | POA: Diagnosis not present

## 2014-09-25 DIAGNOSIS — J449 Chronic obstructive pulmonary disease, unspecified: Secondary | ICD-10-CM | POA: Insufficient documentation

## 2014-09-25 DIAGNOSIS — Z01812 Encounter for preprocedural laboratory examination: Secondary | ICD-10-CM | POA: Diagnosis present

## 2014-09-25 DIAGNOSIS — Z79899 Other long term (current) drug therapy: Secondary | ICD-10-CM | POA: Diagnosis not present

## 2014-09-25 DIAGNOSIS — Z87891 Personal history of nicotine dependence: Secondary | ICD-10-CM | POA: Diagnosis not present

## 2014-09-25 DIAGNOSIS — I10 Essential (primary) hypertension: Secondary | ICD-10-CM | POA: Insufficient documentation

## 2014-09-25 DIAGNOSIS — H25812 Combined forms of age-related cataract, left eye: Secondary | ICD-10-CM | POA: Insufficient documentation

## 2014-09-25 HISTORY — DX: Hypothyroidism, unspecified: E03.9

## 2014-09-25 LAB — HEMOGLOBIN AND HEMATOCRIT, BLOOD
HEMATOCRIT: 38.4 % (ref 36.0–46.0)
HEMOGLOBIN: 12.6 g/dL (ref 12.0–15.0)

## 2014-09-25 LAB — BASIC METABOLIC PANEL
ANION GAP: 6 (ref 5–15)
BUN: 13 mg/dL (ref 6–23)
CALCIUM: 9.4 mg/dL (ref 8.4–10.5)
CO2: 28 mmol/L (ref 19–32)
Chloride: 106 mEq/L (ref 96–112)
Creatinine, Ser: 0.82 mg/dL (ref 0.50–1.10)
GFR calc non Af Amer: 69 mL/min — ABNORMAL LOW (ref >90)
GFR, EST AFRICAN AMERICAN: 80 mL/min — AB (ref >90)
GLUCOSE: 150 mg/dL — AB (ref 70–99)
Potassium: 4 mmol/L (ref 3.5–5.1)
Sodium: 140 mmol/L (ref 135–145)

## 2014-09-25 NOTE — Pre-Procedure Instructions (Signed)
Patient given information to sign up for my chart at home. 

## 2014-09-26 MED ORDER — KETOROLAC TROMETHAMINE 0.5 % OP SOLN: 1.0000 [drp] | OPHTHALMIC | Status: DC

## 2014-09-27 MED ORDER — TETRACAINE HCL 0.5 % OP SOLN
OPHTHALMIC | Status: AC
  Filled 2014-09-27: qty 2

## 2014-09-27 MED ORDER — NEOMYCIN-POLYMYXIN-DEXAMETH 3.5-10000-0.1 OP SUSP
OPHTHALMIC | Status: AC
Start: 2014-09-27 — End: 2014-09-27
  Filled 2014-09-27: qty 5

## 2014-09-27 MED ORDER — LIDOCAINE HCL (PF) 1 % IJ SOLN
INTRAMUSCULAR | Status: AC
  Filled 2014-09-27: qty 2

## 2014-09-27 MED ORDER — CYCLOPENTOLATE-PHENYLEPHRINE OP SOLN OPTIME - NO CHARGE
OPHTHALMIC | Status: AC
  Filled 2014-09-27: qty 2

## 2014-09-27 MED ORDER — LIDOCAINE HCL 3.5 % OP GEL
OPHTHALMIC | Status: AC
  Filled 2014-09-27: qty 1

## 2014-09-27 MED ORDER — PHENYLEPHRINE HCL 2.5 % OP SOLN
OPHTHALMIC | Status: AC
  Filled 2014-09-27: qty 15

## 2014-09-30 ENCOUNTER — Encounter (HOSPITAL_COMMUNITY): Payer: Self-pay | Admitting: *Deleted

## 2014-09-30 ENCOUNTER — Ambulatory Visit (HOSPITAL_COMMUNITY): Payer: Medicare Other | Admitting: Anesthesiology

## 2014-09-30 ENCOUNTER — Encounter (HOSPITAL_COMMUNITY)
Admission: RE | Disposition: A | Discharge status: Home / Self Care | Payer: Self-pay | Source: Ambulatory Visit | Attending: Ophthalmology

## 2014-09-30 ENCOUNTER — Ambulatory Visit (HOSPITAL_COMMUNITY)
Admission: RE | Admit: 2014-09-30 | Discharge: 2014-09-30 | Disposition: A | Discharge status: Home / Self Care | Payer: Medicare Other | Source: Ambulatory Visit | Attending: Ophthalmology | Admitting: Ophthalmology

## 2014-09-30 DIAGNOSIS — H25812 Combined forms of age-related cataract, left eye: Secondary | ICD-10-CM | POA: Diagnosis present

## 2014-09-30 HISTORY — PX: CATARACT EXTRACTION W/PHACO: SHX586

## 2014-09-30 SURGERY — PHACOEMULSIFICATION, CATARACT, WITH IOL INSERTION
Anesthesia: Monitor Anesthesia Care | Site: Eye | Laterality: Left

## 2014-09-30 MED ORDER — POVIDONE-IODINE 5 % OP SOLN
OPHTHALMIC | Status: DC | PRN
  Administered 2014-09-30: 1 via OPHTHALMIC

## 2014-09-30 MED ORDER — EPINEPHRINE HCL 1 MG/ML IJ SOLN
INTRAMUSCULAR | Status: AC
  Filled 2014-09-30: qty 1

## 2014-09-30 MED ORDER — FENTANYL CITRATE 0.05 MG/ML IJ SOLN
25.0000 ug | INTRAMUSCULAR | Status: AC
  Administered 2014-09-30 (×2): 25 ug via INTRAVENOUS

## 2014-09-30 MED ORDER — FENTANYL CITRATE 0.05 MG/ML IJ SOLN: 25.0000 ug | INTRAMUSCULAR | Status: AC | PRN

## 2014-09-30 MED ORDER — MIDAZOLAM HCL 2 MG/2ML IJ SOLN
INTRAMUSCULAR | Status: AC
  Filled 2014-09-30: qty 2

## 2014-09-30 MED ORDER — TETRACAINE HCL 0.5 % OP SOLN
1.0000 [drp] | OPHTHALMIC | Status: AC
  Administered 2014-09-30 (×3): 1 [drp] via OPHTHALMIC

## 2014-09-30 MED ORDER — PHENYLEPHRINE HCL 2.5 % OP SOLN
1.0000 [drp] | OPHTHALMIC | Status: AC
  Administered 2014-09-30 (×3): 1 [drp] via OPHTHALMIC

## 2014-09-30 MED ORDER — FENTANYL CITRATE 0.05 MG/ML IJ SOLN
INTRAMUSCULAR | Status: AC
  Filled 2014-09-30: qty 2

## 2014-09-30 MED ORDER — NEOMYCIN-POLYMYXIN-DEXAMETH 3.5-10000-0.1 OP SUSP
OPHTHALMIC | Status: DC | PRN
  Administered 2014-09-30: 2 [drp] via OPHTHALMIC

## 2014-09-30 MED ORDER — CYCLOPENTOLATE-PHENYLEPHRINE 0.2-1 % OP SOLN
1.0000 [drp] | OPHTHALMIC | Status: AC
  Administered 2014-09-30 (×3): 1 [drp] via OPHTHALMIC

## 2014-09-30 MED ORDER — LACTATED RINGERS IV SOLN
INTRAVENOUS | Status: DC
  Administered 2014-09-30: 08:00:00 via INTRAVENOUS

## 2014-09-30 MED ORDER — ONDANSETRON HCL 4 MG/2ML IJ SOLN: 4.0000 mg | Freq: Once | INTRAMUSCULAR | Status: AC | PRN

## 2014-09-30 MED ORDER — LIDOCAINE HCL (PF) 1 % IJ SOLN
INTRAMUSCULAR | Status: DC | PRN
  Administered 2014-09-30: .5 mL

## 2014-09-30 MED ORDER — PROVISC 10 MG/ML IO SOLN
INTRAOCULAR | Status: DC | PRN
  Administered 2014-09-30: 0.85 mL via INTRAOCULAR

## 2014-09-30 MED ORDER — LIDOCAINE HCL 3.5 % OP GEL
1.0000 "application " | Freq: Once | OPHTHALMIC | Status: AC
  Administered 2014-09-30: 1 via OPHTHALMIC

## 2014-09-30 MED ORDER — MIDAZOLAM HCL 2 MG/2ML IJ SOLN
1.0000 mg | INTRAMUSCULAR | Status: DC | PRN
Start: 2014-09-30 — End: 2014-09-30
  Administered 2014-09-30: 2 mg via INTRAVENOUS

## 2014-09-30 MED ORDER — BSS IO SOLN
INTRAOCULAR | Status: DC | PRN
  Administered 2014-09-30: 15 mL via INTRAOCULAR

## 2014-09-30 MED ORDER — EPINEPHRINE HCL 1 MG/ML IJ SOLN
INTRAOCULAR | Status: DC | PRN
  Administered 2014-09-30: 08:00:00

## 2014-09-30 SURGICAL SUPPLY — 34 items
CAPSULAR TENSION RING-AMO (OPHTHALMIC RELATED) IMPLANT
CLOTH BEACON ORANGE TIMEOUT ST (SAFETY) ×2 IMPLANT
EYE SHIELD UNIVERSAL CLEAR (GAUZE/BANDAGES/DRESSINGS) ×2 IMPLANT
GLOVE BIO SURGEON STRL SZ 6.5 (GLOVE) IMPLANT
GLOVE BIO SURGEONS STRL SZ 6.5 (GLOVE)
GLOVE BIOGEL PI IND STRL 6.5 (GLOVE) IMPLANT
GLOVE BIOGEL PI IND STRL 7.0 (GLOVE) IMPLANT
GLOVE BIOGEL PI IND STRL 7.5 (GLOVE) IMPLANT
GLOVE BIOGEL PI INDICATOR 6.5 (GLOVE) ×2
GLOVE BIOGEL PI INDICATOR 7.0 (GLOVE)
GLOVE BIOGEL PI INDICATOR 7.5 (GLOVE)
GLOVE ECLIPSE 6.5 STRL STRAW (GLOVE) IMPLANT
GLOVE ECLIPSE 7.0 STRL STRAW (GLOVE) IMPLANT
GLOVE ECLIPSE 7.5 STRL STRAW (GLOVE) IMPLANT
GLOVE EXAM NITRILE LRG STRL (GLOVE) ×2 IMPLANT
GLOVE EXAM NITRILE MD LF STRL (GLOVE) IMPLANT
GLOVE SKINSENSE NS SZ6.5 (GLOVE)
GLOVE SKINSENSE NS SZ7.0 (GLOVE)
GLOVE SKINSENSE STRL SZ6.5 (GLOVE) IMPLANT
GLOVE SKINSENSE STRL SZ7.0 (GLOVE) IMPLANT
KIT VITRECTOMY (OPHTHALMIC RELATED) IMPLANT
PAD ARMBOARD 7.5X6 YLW CONV (MISCELLANEOUS) ×2 IMPLANT
PROC W NO LENS (INTRAOCULAR LENS)
PROC W SPEC LENS (INTRAOCULAR LENS)
PROCESS W NO LENS (INTRAOCULAR LENS) IMPLANT
PROCESS W SPEC LENS (INTRAOCULAR LENS) IMPLANT
RETRACTOR IRIS SIGHTPATH (OPHTHALMIC RELATED) IMPLANT
RING MALYGIN (MISCELLANEOUS) IMPLANT
SIGHTPATH CAT PROC W REG LENS (Ophthalmic Related) ×3 IMPLANT
SYRINGE LUER LOK 1CC (MISCELLANEOUS) ×2 IMPLANT
TAPE SURG TRANSPARENT 2IN (GAUZE/BANDAGES/DRESSINGS) IMPLANT
TAPE TRANSPARENT 2IN (GAUZE/BANDAGES/DRESSINGS) ×2
VISCOELASTIC ADDITIONAL (OPHTHALMIC RELATED) IMPLANT
WATER STERILE IRR 250ML POUR (IV SOLUTION) ×2 IMPLANT

## 2014-09-30 NOTE — Anesthesia Preprocedure Evaluation (Signed)
Anesthesia Evaluation  Patient identified by MRN, date of birth, ID band Patient awake    Reviewed: Allergy & Precautions, H&P , NPO status , Patient's Chart, lab work & pertinent test results, reviewed documented beta blocker date and time   Airway Mallampati: II  TM Distance: >3 FB     Dental  (+) Teeth Intact, Partial Lower   Pulmonary shortness of breath and with exertion, COPDCurrent Smoker,  breath sounds clear to auscultation        Cardiovascular hypertension, Pt. on medications + Peripheral Vascular Disease + dysrhythmias (palpitations, beta blockers) Rhythm:Regular Rate:Normal     Neuro/Psych    GI/Hepatic GERD-  Medicated and Controlled,  Endo/Other    Renal/GU      Musculoskeletal   Abdominal   Peds  Hematology   Anesthesia Other Findings   Reproductive/Obstetrics                             Anesthesia Physical Anesthesia Plan  ASA: III  Anesthesia Plan: MAC   Post-op Pain Management:    Induction: Intravenous  Airway Management Planned: Nasal Cannula  Additional Equipment:   Intra-op Plan:   Post-operative Plan:   Informed Consent: I have reviewed the patients History and Physical, chart, labs and discussed the procedure including the risks, benefits and alternatives for the proposed anesthesia with the patient or authorized representative who has indicated his/her understanding and acceptance.     Plan Discussed with:   Anesthesia Plan Comments:         Anesthesia Quick Evaluation

## 2014-09-30 NOTE — Transfer of Care (Signed)
Immediate Anesthesia Transfer of Care Note  Patient: [name]  Procedure(s) Performed: Procedure(s) with comments: CATARACT EXTRACTION PHACO AND INTRAOCULAR LENS PLACEMENT LEFT EYE (Left) - CDE:8.79  Patient Location: Short Stay  Anesthesia Type:MAC  Level of Consciousness: awake  Airway & Oxygen Therapy: Patient Spontanous Breathing  Post-op Assessment: Report given to PACU RN  Post vital signs: Reviewed  Complications: No apparent anesthesia complications

## 2014-09-30 NOTE — Op Note (Signed)
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  Assistants: None  Anesthesia: Topical with MAC  Indications: Painless, progressive loss of vision with compromise of daily activities.  Surgery: Cataract Extraction with Intraocular lens Implant Left Eye  Discription: The patient had dilating drops and viscous lidocaine placed into the Left eye in the pre-op holding area. After transfer to the operating room, a time out was performed. The patient was then prepped and draped. Beginning with a 15 degree blade a paracentesis port was made at the surgeon's 2 o'clock position. The anterior chamber was then filled with 1% non-preserved lidocaine. This was followed by filling the anterior chamber with Provisc.  A 2.53mm keratome blade was used to make a clear corneal incision at the temporal limbus.  A bent cystatome needle was used to create a continuous tear capsulotomy. Hydrodissection was performed with balanced salt solution on a Fine canula. The lens nucleus was then removed using the phacoemulsification handpiece. Residual cortex was removed with the I&A handpiece. The anterior chamber and capsular bag were refilled with Provisc. A posterior chamber intraocular lens was placed into the capsular bag with it's injector. The implant was positioned with the Kuglan hook. The Provisc was then removed from the anterior chamber and capsular bag with the I&A handpiece. Stromal hydration of the main incision and paracentesis port was performed with BSS on a Fine canula. The wounds were tested for leak which was negative. The patient tolerated the procedure well. There were no operative complications. The patient was then transferred to the recovery room in stable condition.  Complications: None  Specimen: None  EBL: None  Prosthetic device: Hoya iSert 250, power 22.0 D, SN  [id number].

## 2014-09-30 NOTE — Anesthesia Postprocedure Evaluation (Signed)
  Anesthesia Post-op Note  Patient: [name]  Procedure(s) Performed: Procedure(s) with comments: CATARACT EXTRACTION PHACO AND INTRAOCULAR LENS PLACEMENT LEFT EYE (Left) - CDE:8.79  Patient Location: Short Stay  Anesthesia Type:MAC  Level of Consciousness: awake, alert  and oriented  Airway and Oxygen Therapy: Patient Spontanous Breathing  Post-op Pain: none  Post-op Assessment: Post-op Vital signs reviewed, Patient's Cardiovascular Status Stable, Respiratory Function Stable, Patent Airway and No signs of Nausea or vomiting  Post-op Vital Signs: Reviewed and stable  Last Vitals:  Filed Vitals:   09/30/14 0750  BP: 113/54  Pulse:   Temp:   Resp: 20    Complications: No apparent anesthesia complications

## 2014-09-30 NOTE — Discharge Instructions (Signed)

## 2014-09-30 NOTE — H&P (Signed)
I have reviewed the H&P, the patient was re-examined, and I have identified no interval changes in medical condition and plan of care since the history and physical of record  

## 2014-10-01 ENCOUNTER — Encounter (HOSPITAL_COMMUNITY): Payer: Self-pay | Admitting: Ophthalmology

## 2014-10-10 ENCOUNTER — Encounter (HOSPITAL_COMMUNITY)
Admission: RE | Admit: 2014-10-10 | Discharge: 2014-10-10 | Disposition: A | Discharge status: Home / Self Care | Payer: Medicare Other | Source: Ambulatory Visit | Attending: Ophthalmology | Admitting: Ophthalmology

## 2014-10-16 MED ORDER — TETRACAINE HCL 0.5 % OP SOLN
OPHTHALMIC | Status: AC
  Filled 2014-10-16: qty 2

## 2014-10-16 MED ORDER — LIDOCAINE HCL (PF) 1 % IJ SOLN
INTRAMUSCULAR | Status: AC
  Filled 2014-10-16: qty 2

## 2014-10-16 MED ORDER — CYCLOPENTOLATE-PHENYLEPHRINE OP SOLN OPTIME - NO CHARGE
OPHTHALMIC | Status: AC
  Filled 2014-10-16: qty 2

## 2014-10-16 MED ORDER — LIDOCAINE HCL 3.5 % OP GEL
OPHTHALMIC | Status: AC
  Filled 2014-10-16: qty 1

## 2014-10-16 MED ORDER — NEOMYCIN-POLYMYXIN-DEXAMETH 3.5-10000-0.1 OP SUSP
OPHTHALMIC | Status: AC
  Filled 2014-10-16: qty 5

## 2014-10-16 MED ORDER — PHENYLEPHRINE HCL 2.5 % OP SOLN
OPHTHALMIC | Status: AC
  Filled 2014-10-16: qty 15

## 2014-10-17 ENCOUNTER — Encounter (HOSPITAL_COMMUNITY): Payer: Self-pay | Admitting: *Deleted

## 2014-10-17 ENCOUNTER — Ambulatory Visit (HOSPITAL_COMMUNITY): Payer: Medicare Other | Admitting: Anesthesiology

## 2014-10-17 ENCOUNTER — Encounter (HOSPITAL_COMMUNITY)
Admission: RE | Disposition: A | Discharge status: Home / Self Care | Payer: Self-pay | Source: Ambulatory Visit | Attending: Ophthalmology

## 2014-10-17 ENCOUNTER — Ambulatory Visit (HOSPITAL_COMMUNITY)
Admission: RE | Admit: 2014-10-17 | Discharge: 2014-10-17 | Disposition: A | Discharge status: Home / Self Care | Payer: Medicare Other | Source: Ambulatory Visit | Attending: Ophthalmology | Admitting: Ophthalmology

## 2014-10-17 DIAGNOSIS — H25811 Combined forms of age-related cataract, right eye: Secondary | ICD-10-CM | POA: Insufficient documentation

## 2014-10-17 HISTORY — PX: CATARACT EXTRACTION W/PHACO: SHX586

## 2014-10-17 SURGERY — PHACOEMULSIFICATION, CATARACT, WITH IOL INSERTION
Anesthesia: Monitor Anesthesia Care | Site: Eye | Laterality: Right

## 2014-10-17 MED ORDER — FENTANYL CITRATE 0.05 MG/ML IJ SOLN
INTRAMUSCULAR | Status: AC
  Filled 2014-10-17: qty 2

## 2014-10-17 MED ORDER — PHENYLEPHRINE HCL 2.5 % OP SOLN
1.0000 [drp] | OPHTHALMIC | Status: AC
Start: 2014-10-17 — End: 2014-10-17
  Administered 2014-10-17 (×3): 1 [drp] via OPHTHALMIC

## 2014-10-17 MED ORDER — MIDAZOLAM HCL 2 MG/2ML IJ SOLN
INTRAMUSCULAR | Status: AC
  Filled 2014-10-17: qty 2

## 2014-10-17 MED ORDER — LIDOCAINE HCL (PF) 1 % IJ SOLN
INTRAMUSCULAR | Status: DC | PRN
  Administered 2014-10-17: .5 mL

## 2014-10-17 MED ORDER — EPINEPHRINE HCL 1 MG/ML IJ SOLN
INTRAMUSCULAR | Status: AC
  Filled 2014-10-17: qty 1

## 2014-10-17 MED ORDER — LACTATED RINGERS IV SOLN
INTRAVENOUS | Status: DC
  Administered 2014-10-17: 1000 mL via INTRAVENOUS

## 2014-10-17 MED ORDER — BSS IO SOLN
INTRAOCULAR | Status: DC | PRN
Start: 2014-10-17 — End: 2014-10-17
  Administered 2014-10-17: 15 mL via INTRAOCULAR

## 2014-10-17 MED ORDER — EPINEPHRINE HCL 1 MG/ML IJ SOLN
INTRAOCULAR | Status: DC | PRN
  Administered 2014-10-17: 08:00:00

## 2014-10-17 MED ORDER — LIDOCAINE 3.5 % OP GEL OPTIME - NO CHARGE
OPHTHALMIC | Status: DC | PRN
  Administered 2014-10-17: 1 [drp] via OPHTHALMIC

## 2014-10-17 MED ORDER — POVIDONE-IODINE 5 % OP SOLN
OPHTHALMIC | Status: DC | PRN
  Administered 2014-10-17: 1 via OPHTHALMIC

## 2014-10-17 MED ORDER — PROVISC 10 MG/ML IO SOLN
INTRAOCULAR | Status: DC | PRN
  Administered 2014-10-17: 0.85 mL via INTRAOCULAR

## 2014-10-17 MED ORDER — NEOMYCIN-POLYMYXIN-DEXAMETH 3.5-10000-0.1 OP SUSP
OPHTHALMIC | Status: DC | PRN
  Administered 2014-10-17: 1 [drp] via OPHTHALMIC

## 2014-10-17 MED ORDER — MIDAZOLAM HCL 2 MG/2ML IJ SOLN
1.0000 mg | INTRAMUSCULAR | Status: DC | PRN
  Administered 2014-10-17: 2 mg via INTRAVENOUS

## 2014-10-17 MED ORDER — FENTANYL CITRATE 0.05 MG/ML IJ SOLN
25.0000 ug | INTRAMUSCULAR | Status: AC
  Administered 2014-10-17 (×2): 25 ug via INTRAVENOUS

## 2014-10-17 MED ORDER — CYCLOPENTOLATE-PHENYLEPHRINE 0.2-1 % OP SOLN
1.0000 [drp] | OPHTHALMIC | Status: AC
  Administered 2014-10-17 (×3): 1 [drp] via OPHTHALMIC

## 2014-10-17 MED ORDER — LIDOCAINE HCL 3.5 % OP GEL
1.0000 "application " | Freq: Once | OPHTHALMIC | Status: AC
  Administered 2014-10-17: 1 via OPHTHALMIC

## 2014-10-17 MED ORDER — TETRACAINE HCL 0.5 % OP SOLN
1.0000 [drp] | OPHTHALMIC | Status: AC
Start: 2014-10-17 — End: 2014-10-17
  Administered 2014-10-17 (×3): 1 [drp] via OPHTHALMIC

## 2014-10-17 SURGICAL SUPPLY — 11 items
CLOTH BEACON ORANGE TIMEOUT ST (SAFETY) ×2 IMPLANT
EYE SHIELD UNIVERSAL CLEAR (GAUZE/BANDAGES/DRESSINGS) ×2 IMPLANT
GLOVE BIOGEL PI IND STRL 7.0 (GLOVE) IMPLANT
GLOVE BIOGEL PI INDICATOR 7.0 (GLOVE) ×2
GLOVE EXAM NITRILE LRG STRL (GLOVE) ×2 IMPLANT
PAD ARMBOARD 7.5X6 YLW CONV (MISCELLANEOUS) ×2 IMPLANT
SIGHTPATH CAT PROC W REG LENS (Ophthalmic Related) ×3 IMPLANT
SYRINGE LUER LOK 1CC (MISCELLANEOUS) ×2 IMPLANT
TAPE SURG TRANSPORE 1 IN (GAUZE/BANDAGES/DRESSINGS) IMPLANT
TAPE SURGICAL TRANSPORE 1 IN (GAUZE/BANDAGES/DRESSINGS) ×2
WATER STERILE IRR 250ML POUR (IV SOLUTION) ×2 IMPLANT

## 2014-10-17 NOTE — Discharge Instructions (Signed)

## 2014-10-17 NOTE — Anesthesia Postprocedure Evaluation (Signed)
  Anesthesia Post-op Note  Patient: [name]  Procedure(s) Performed: Procedure(s) with comments: CATARACT EXTRACTION PHACO AND INTRAOCULAR LENS PLACEMENT RIGHT EYE (Right) - CDE: 7.80  Patient Location: Short Stay  Anesthesia Type:MAC  Level of Consciousness: awake, alert  and oriented  Airway and Oxygen Therapy: Patient Spontanous Breathing  Post-op Pain: none  Post-op Assessment: Post-op Vital signs reviewed, Patient's Cardiovascular Status Stable, Respiratory Function Stable, Patent Airway and No signs of Nausea or vomiting  Post-op Vital Signs: Reviewed and stable  Last Vitals:  Filed Vitals:   10/17/14 0700  BP: 122/51  Pulse:   Temp:   Resp: 20    Complications: No apparent anesthesia complications

## 2014-10-17 NOTE — Transfer of Care (Signed)
Immediate Anesthesia Transfer of Care Note  Patient: [name]  Procedure(s) Performed: Procedure(s) with comments: CATARACT EXTRACTION PHACO AND INTRAOCULAR LENS PLACEMENT RIGHT EYE (Right) - CDE: 7.80  Patient Location: Short Stay  Anesthesia Type:MAC  Level of Consciousness: awake  Airway & Oxygen Therapy: Patient Spontanous Breathing  Post-op Assessment: Report given to PACU RN  Post vital signs: Reviewed  Last Vitals:  Filed Vitals:   10/17/14 0700  BP: 122/51  Pulse:   Temp:   Resp: 20    Complications: No apparent anesthesia complications

## 2014-10-17 NOTE — Op Note (Signed)
Date of Admission: 10/17/2014  Date of Surgery: 10/17/2014   Pre-Op Dx: Cataract Right Eye  Post-Op Dx: Senile Combined Cataract Right  Eye,  Dx Code E95.284  Surgeon: Tonny Branch, M.D.  Assistants: None  Anesthesia: Topical with MAC  Indications: Painless, progressive loss of vision with compromise of daily activities.  Surgery: Cataract Extraction with Intraocular lens Implant Right Eye  Discription: The patient had dilating drops and viscous lidocaine placed into the Right eye in the pre-op holding area. After transfer to the operating room, a time out was performed. The patient was then prepped and draped. Beginning with a 64 degree blade a paracentesis port was made at the surgeon's 2 o'clock position. The anterior chamber was then filled with 1% non-preserved lidocaine. This was followed by filling the anterior chamber with Provisc.  A 2.93mm keratome blade was used to make a clear corneal incision at the temporal limbus.  A bent cystatome needle was used to create a continuous tear capsulotomy. Hydrodissection was performed with balanced salt solution on a Fine canula. The lens nucleus was then removed using the phacoemulsification handpiece. Residual cortex was removed with the I&A handpiece. The anterior chamber and capsular bag were refilled with Provisc. A posterior chamber intraocular lens was placed into the capsular bag with it's injector. The implant was positioned with the Kuglan hook. The Provisc was then removed from the anterior chamber and capsular bag with the I&A handpiece. Stromal hydration of the main incision and paracentesis port was performed with BSS on a Fine canula. The wounds were tested for leak which was negative. The patient tolerated the procedure well. There were no operative complications. The patient was then transferred to the recovery room in stable condition.  Complications: None  Specimen: None  EBL: None  Prosthetic device: Hoya iSert 250, power 23.0  D, SN [id number].

## 2014-10-17 NOTE — H&P (Signed)
I have reviewed the H&P, the patient was re-examined, and I have identified no interval changes in medical condition and plan of care since the history and physical of record  

## 2014-10-17 NOTE — Anesthesia Preprocedure Evaluation (Signed)
Anesthesia Evaluation  Patient identified by MRN, date of birth, ID band Patient awake    Reviewed: Allergy & Precautions, H&P , NPO status , Patient's Chart, lab work & pertinent test results, reviewed documented beta blocker date and time   Airway Mallampati: II  TM Distance: >3 FB     Dental  (+) Teeth Intact, Partial Lower   Pulmonary shortness of breath and with exertion, COPDCurrent Smoker,  breath sounds clear to auscultation        Cardiovascular hypertension, Pt. on medications + Peripheral Vascular Disease + dysrhythmias (palpitations, beta blockers) Rhythm:Regular Rate:Normal     Neuro/Psych    GI/Hepatic GERD-  Medicated and Controlled,  Endo/Other    Renal/GU      Musculoskeletal   Abdominal   Peds  Hematology   Anesthesia Other Findings   Reproductive/Obstetrics                             Anesthesia Physical Anesthesia Plan  ASA: III  Anesthesia Plan: MAC   Post-op Pain Management:    Induction: Intravenous  Airway Management Planned: Nasal Cannula  Additional Equipment:   Intra-op Plan:   Post-operative Plan:   Informed Consent: I have reviewed the patients History and Physical, chart, labs and discussed the procedure including the risks, benefits and alternatives for the proposed anesthesia with the patient or authorized representative who has indicated his/her understanding and acceptance.     Plan Discussed with:   Anesthesia Plan Comments:         Anesthesia Quick Evaluation

## 2014-10-18 ENCOUNTER — Encounter (HOSPITAL_COMMUNITY): Payer: Self-pay | Admitting: Ophthalmology

## 2015-02-28 ENCOUNTER — Encounter (HOSPITAL_COMMUNITY): Payer: Self-pay | Admitting: *Deleted

## 2015-02-28 ENCOUNTER — Emergency Department (HOSPITAL_COMMUNITY): Payer: Medicare Other

## 2015-02-28 ENCOUNTER — Emergency Department (HOSPITAL_COMMUNITY)
Admission: EM | Admit: 2015-02-28 | Discharge: 2015-02-28 | Disposition: A | Discharge status: Home / Self Care | Payer: Medicare Other | Attending: Emergency Medicine | Admitting: Emergency Medicine

## 2015-02-28 DIAGNOSIS — W208XXA Other cause of strike by thrown, projected or falling object, initial encounter: Secondary | ICD-10-CM | POA: Insufficient documentation

## 2015-02-28 DIAGNOSIS — E780 Pure hypercholesterolemia: Secondary | ICD-10-CM | POA: Diagnosis not present

## 2015-02-28 DIAGNOSIS — S91201A Unspecified open wound of right great toe with damage to nail, initial encounter: Secondary | ICD-10-CM | POA: Insufficient documentation

## 2015-02-28 DIAGNOSIS — E039 Hypothyroidism, unspecified: Secondary | ICD-10-CM | POA: Insufficient documentation

## 2015-02-28 DIAGNOSIS — S92911B Unspecified fracture of right toe(s), initial encounter for open fracture: Secondary | ICD-10-CM

## 2015-02-28 DIAGNOSIS — I10 Essential (primary) hypertension: Secondary | ICD-10-CM | POA: Diagnosis not present

## 2015-02-28 DIAGNOSIS — Z7982 Long term (current) use of aspirin: Secondary | ICD-10-CM | POA: Diagnosis not present

## 2015-02-28 DIAGNOSIS — J449 Chronic obstructive pulmonary disease, unspecified: Secondary | ICD-10-CM | POA: Diagnosis not present

## 2015-02-28 DIAGNOSIS — Y9289 Other specified places as the place of occurrence of the external cause: Secondary | ICD-10-CM | POA: Diagnosis not present

## 2015-02-28 DIAGNOSIS — S92424B Nondisplaced fracture of distal phalanx of right great toe, initial encounter for open fracture: Secondary | ICD-10-CM | POA: Insufficient documentation

## 2015-02-28 DIAGNOSIS — Z79899 Other long term (current) drug therapy: Secondary | ICD-10-CM | POA: Diagnosis not present

## 2015-02-28 DIAGNOSIS — Y998 Other external cause status: Secondary | ICD-10-CM | POA: Diagnosis not present

## 2015-02-28 DIAGNOSIS — Y9389 Activity, other specified: Secondary | ICD-10-CM | POA: Insufficient documentation

## 2015-02-28 DIAGNOSIS — M1990 Unspecified osteoarthritis, unspecified site: Secondary | ICD-10-CM | POA: Diagnosis not present

## 2015-02-28 DIAGNOSIS — S99921A Unspecified injury of right foot, initial encounter: Secondary | ICD-10-CM | POA: Diagnosis present

## 2015-02-28 DIAGNOSIS — Z720 Tobacco use: Secondary | ICD-10-CM | POA: Diagnosis not present

## 2015-02-28 DIAGNOSIS — S91109A Unspecified open wound of unspecified toe(s) without damage to nail, initial encounter: Secondary | ICD-10-CM

## 2015-02-28 DIAGNOSIS — K219 Gastro-esophageal reflux disease without esophagitis: Secondary | ICD-10-CM | POA: Insufficient documentation

## 2015-02-28 MED ORDER — CEPHALEXIN 500 MG PO CAPS: 1000.0000 mg | ORAL_CAPSULE | Freq: Two times a day (BID) | ORAL | Status: DC

## 2015-02-28 MED ORDER — LIDOCAINE HCL (PF) 1 % IJ SOLN
INTRAMUSCULAR | Status: AC
  Administered 2015-02-28: 14:00:00
  Filled 2015-02-28: qty 5

## 2015-02-28 NOTE — ED Notes (Signed)
Dropped a bucket on her right toe, avulsion to right great toe

## 2015-02-28 NOTE — Discharge Instructions (Signed)
Deep Skin Avulsion A deep skin avulsion is when all layers of the skin or parts of body structures have been torn away. This is usually a result of severe injury (trauma). A deep skin avulsion can include damage to important structures beneath the skin such as tendons, ligaments, nerves, or blood vessels.  CAUSES  Many injuries can lead to a deep skin avulsion. These include:   Crush injuries.  Bites.  Falls against jagged surfaces.  Gunshot wounds.  Severe burns and injuries involving dragging (such as those from a bicycle or motorcycle accident). TREATMENT   If the wound is small and there is no damage to vital structures like nerves and blood vessels, the damaged tissues may be removed. Then, the wound can be cleaned thoroughly and closed.  A skin graft may be performed. This is a procedure in which the outer layer of skin is removed from a different part of your body. That skin (skin graft) is used to cover the open wound. This can happen after damaged tissue is removed and repairs are completed.  Your caregiver may onlyapply a bandage (dressing) to the wound. The wound will be kept clean and allowed to heal. Healing can take weeks or months and usually leaves a large scar. This type of treatment is only done if your caregiver feels that skin grafting or a similar procedure would not work. You might need a tetanus shot if: Fingernail or Toenail Loss All or part of your fingernail or toenail has been lost. This may or may not grow back as a normal nail. A special non-stick bandage has been put on your finger or toe tightly to prevent bleeding. HOME CARE INSTRUCTIONS  The tips of fingers and toes are full of nerves and injuries are often very painful. The following will help you decrease the pain and obtain the best outcome.  Keep your hand or foot elevated above your heart to relieve pain and swelling. This will require lying in bed or on a couch with the hand or leg on pillows or  sitting in a recliner with the leg up. Letting your hand or leg dangle may increase swelling, slow healing and cause throbbing pain.  Keep your dressing dry and clean.  Change your bandage in 24 hours after going home.  After your bandage is changed, soak your hand or foot in warm soapy water for 10 to 20 minutes. Do this 3 times per day. This helps reduce pain and swelling. After soaking, apply a clean, dry bandage. Change your bandage if it is wet or dirty.  Only take over-the-counter or prescription medicines for pain, discomfort, or fever as directed by your caregiver.  See your caregiver as needed for problems. SEEK IMMEDIATE MEDICAL CARE IF:   You have increased pain, swelling, drainage, or bleeding.  You have a fever. MAKE SURE YOU:   Understand these instructions.  Will watch your condition.  Will get help right away if you are not doing well or get worse. Document Released: 07/29/2006 Document Revised: 11/29/2011 Document Reviewed: 10/18/2006 [facility] Patient Information 2015 [place], [place]. This information is not intended to replace advice given to you by your health care provider. Make sure you discuss any questions you have with your health care provider.   You cannot remember when you had your last tetanus shot.  You have never had a tetanus shot.  The injury broke your skin. If you got a tetanus shot, your arm may swell, get red, and feel warm to  the touch. This is common and not a problem. If you need a tetanus shot and you choose not to have one, there is a rare chance of getting tetanus. Sickness from tetanus can be serious. HOME CARE INSTRUCTIONS   Only take over-the-counter or prescription medicines for pain, discomfort, or fever as directed by your caregiver.  Gently wash the area with mild soap and water 2 times a day, or as directed. Rinse off the soap. Pat the area dry with a clean towel. Do not rub the wound. This may cause bleeding.  Follow your  caregiver's instructions for how often you need to change the dressing.  Apply ointment and a dressing to the wound as directed.  If the dressing sticks, moisten it with soapy water and gently remove it.  Change the bandage right away if it becomes wet, dirty, or starts to smell bad.  Take showers. Do not take tub baths, swim, or do anything that may soak the wound until it is healed.  Use anti-itch medicine as directed by your caregiver. The wound may itch when it is healing. Do not pick or scratch at the wound.  Follow up with your caregiver for stitches (sutures), staple, or skin adhesive strip removal. SEEK MEDICAL CARE IF:   You have redness, swelling, or increasing pain in your wound.  A red streak or line extends away from the wound.  You have pus coming from the wound.  You notice a bad smell coming from thewound or dressing.  The wound breaks open (edges not staying together) after sutures have been removed.  You notice something coming out of the wound, such as a small piece of wood, glass, or metal.  You are unable to properly move a finger or toe if the wound is on your hand or foot.  You have severe swelling around the wound that causes pain and numbness.  Your arm, hand, leg, or foot changes color. SEEK IMMEDIATE MEDICAL CARE IF:   Your pain becomes severe or is not adequately relieved with pain medicine.  You have a fever.  You have nausea and vomiting for more than 24 hours.  You feel lightheaded, weak, or faint.  You develop chest pain or difficulty breathing. MAKE SURE YOU:   Understand these instructions.  Will watch your condition.  Will get help right away if you are not doing well or get worse. Document Released: 11/02/2006 Document Revised: 11/29/2011 Document Reviewed: 01/10/2011 [facility] Patient Information 2015 [place], [place]. This information is not intended to replace advice given to you by your health care provider. Make sure you  discuss any questions you have with your health care provider.  Keep your wound clean and dry - elevate as much as possible, use ice if needed for pain relief, or your vicodin if needed.  Keep the dressing in place.  Return here in 2 days for a recheck of your injury.

## 2015-02-28 NOTE — ED Notes (Signed)
Bucket full of water dropped on rt great toe when handle broke. Nail is avulsed partially.

## 2015-02-28 NOTE — ED Notes (Signed)
Great toe wrapped with xero form , gauze and kling , began to ooze blood,  Toe rewrapped , with same.  Taken out via w/c , small amt of blood noticed on gauze as getting into car.  Pt will elevate and hopefully alleviate this.

## 2015-03-02 NOTE — ED Provider Notes (Signed)
CSN: [id number]     Arrival date & time 02/28/15  1209 History   First MD Initiated Contact with Patient 02/28/15 1228     Chief Complaint  Patient presents with  . Extremity Laceration     (Consider location/radiation/quality/duration/timing/severity/associated sxs/prior Treatment) The history is provided by the patient and the spouse.   [name] is a 74 y.o. female who was sent from her pcp's office for further management of an avulsion injury to her right great toe.  She had filled a 5 gallon bucket with water to wash her car and as she was carrying it the handle broke avulsing her distal great toe and partial toenail.  Pcp was unable to obtain hemostasis so was directed here. Pt reports moderate pain with palpation.  There is no radiation of pain.  She was given a tetanus today before arriving here.     Past Medical History  Diagnosis Date  . Hypertension   . Dysrhythmia   . GERD (gastroesophageal reflux disease)   . Shortness of breath   . COPD (chronic obstructive pulmonary disease)   . Hypercholesteremia   . Arthritis   . Carotid artery disease   . Hypothyroidism    Past Surgical History  Procedure Laterality Date  . Appendectomy    . Tubal ligation    . Abdominal hysterectomy    . Cholecystectomy N/A 04/30/2013    Procedure: Attempted LAPAROSCOPIC CHOLECYSTECTOMY ; converted to open procedure @ 1510;  Surgeon: Scherry Ran, MD;  Location: [facility];  Service: General;  Laterality: N/A;  . Cholecystectomy N/A 04/30/2013    Procedure: CHOLECYSTECTOMY;  Surgeon: Scherry Ran, MD;  Location: [facility];  Service: General;  Laterality: N/A;  procedure started @ 1520  . Cataract extraction w/phaco Left 09/30/2014    Procedure: CATARACT EXTRACTION PHACO AND INTRAOCULAR LENS PLACEMENT LEFT EYE;  Surgeon: Tonny Branch, MD;  Location: [facility];  Service: Ophthalmology;  Laterality: Left;  CDE:8.79  . Cataract extraction w/phaco Right 10/17/2014    Procedure: CATARACT  EXTRACTION PHACO AND INTRAOCULAR LENS PLACEMENT RIGHT EYE;  Surgeon: Tonny Branch, MD;  Location: [facility];  Service: Ophthalmology;  Laterality: Right;  CDE: 7.80   No family history on file. History  Substance Use Topics  . Smoking status: Current Every Day Smoker -- 0.25 packs/day for 55 years    Types: Cigarettes  . Smokeless tobacco: Not on file     Comment: patient has 1 cig every 2 days  . Alcohol Use: No   OB History    No data available     Review of Systems  Constitutional: Negative for fever and chills.  Respiratory: Negative for shortness of breath and wheezing.   Musculoskeletal: Positive for arthralgias.  Skin: Positive for wound.  Neurological: Negative for numbness.      Allergies  Codeine  Home Medications   Prior to Admission medications   Medication Sig Start Date End Date Taking? Authorizing Provider  acyclovir (ZOVIRAX) 200 MG capsule Take 200 mg by mouth daily as needed (infection).    Yes Historical Provider, MD  albuterol (PROVENTIL HFA;VENTOLIN HFA) 108 (90 BASE) MCG/ACT inhaler Inhale 2 puffs into the lungs every 6 (six) hours as needed for wheezing or shortness of breath.   Yes Historical Provider, MD  ALPRAZolam Duanne Moron) 0.5 MG tablet Take 0.25 tablets by mouth at bedtime as needed for sleep.  02/06/14  Yes Historical Provider, MD  aspirin EC 81 MG tablet Take 81 mg by mouth 2 (two)  times a week. 05/02/13  Yes Felicie Morn, MD  atorvastatin (LIPITOR) 40 MG tablet Take 40 mg by mouth daily.   Yes Historical Provider, MD  benazepril (LOTENSIN) 10 MG tablet Take 10 mg by mouth daily.   Yes Historical Provider, MD  BIOTIN PO Take 2 tablets by mouth daily.   Yes Historical Provider, MD  Calcium Carbonate-Vitamin D (CALTRATE 600+D PO) Take 1 tablet by mouth daily.   Yes Historical Provider, MD  cholecalciferol (VITAMIN D) 1000 UNITS tablet Take 1,000 Units by mouth daily.   Yes Historical Provider, MD  hydrochlorothiazide (MICROZIDE) 12.5 MG capsule Take 1  capsule by mouth daily. 09/25/14  Yes Historical Provider, MD  levothyroxine (SYNTHROID, LEVOTHROID) 88 MCG tablet Take 88 mcg by mouth daily before breakfast.   Yes Historical Provider, MD  metoprolol (LOPRESSOR) 50 MG tablet Take 25 mg by mouth 2 (two) times daily.   Yes Historical Provider, MD  Multiple Vitamins-Minerals (CENTRUM SILVER PO) Take 1 tablet by mouth daily.   Yes Historical Provider, MD  naproxen (NAPROSYN) 500 MG tablet Take 500 mg by mouth daily as needed for mild pain.  12/19/14  Yes Historical Provider, MD  omeprazole (PRILOSEC) 20 MG capsule Take 20 mg by mouth 2 (two) times daily.   Yes Historical Provider, MD  cephALEXin (KEFLEX) 500 MG capsule Take 2 capsules (1,000 mg total) by mouth 2 (two) times daily. 02/28/15   Evalee Jefferson, PA-C   BP 166/80 mmHg  Pulse 66  Temp(Src) 98.2 F (36.8 C) (Oral)  Resp 18  Wt 161 lb (73.029 kg)  SpO2 99% Physical Exam  Constitutional: She is oriented to person, place, and time. She appears well-developed and well-nourished.  HENT:  Head: Normocephalic.  Cardiovascular: Normal rate.   Pulmonary/Chest: Effort normal.  Musculoskeletal: She exhibits tenderness.  Neurological: She is alert and oriented to person, place, and time. No sensory deficit.  Skin: Laceration noted.  Avulsion to the medial right great distal toe and nail, approximately 1/3 of this medial nail, nail bed and subcutaneous layer is avulsed. No visible protruding bone.  Fragile appearing blood clot settled in the middle of the wound with slight persistent oozing around the clot.    ED Course  Procedures (including critical care time) Labs Review Labs Reviewed - No data to display  Imaging Review No results found.   EKG Interpretation None      MDM   Final diagnoses:  Avulsion of skin of toe, initial encounter  Toe fracture, right, open, initial encounter    Patients labs and/or radiological studies were reviewed and considered during the medical decision  making and disposition process.  Results were also discussed with patient.   Dg Toe Great Right  02/28/2015   CLINICAL DATA:  Dropped but could on first toe with pain  EXAM: RIGHT GREAT TOE  COMPARISON:  None.  FINDINGS: There is an undisplaced fracture through the midportion of the first distal phalanx. Associated soft tissue swelling is noted. No other focal abnormality is seen.  IMPRESSION: Undisplaced fracture of the first distal phalanx.   Electronically Signed   By: Inez Catalina M.D.   On: 02/28/2015 13:01    Pt was seen by Dr. Lacinda Axon during visit.  Pressure applied to toe with hemostasis obtained, xeroform gauze, then bulky dressing, post op shoe.  Advised recheck of injury here in 2 days.  Keep dressing in place until then. She was placed on keflex.  Advised ice, elevation. She has hydrocodone at home, will use  prn pain.  Tetanus was updated at pcp's office before arrival here.   Evalee Jefferson, PA-C 03/02/15 2236  Nat Christen, MD 03/03/15 [phone]

## 2015-04-03 ENCOUNTER — Encounter: Payer: Self-pay | Admitting: Cardiovascular Disease

## 2015-04-16 ENCOUNTER — Encounter: Payer: Self-pay | Admitting: Cardiovascular Disease

## 2015-04-16 ENCOUNTER — Ambulatory Visit (INDEPENDENT_AMBULATORY_CARE_PROVIDER_SITE_OTHER): Payer: Medicare Other | Admitting: Cardiovascular Disease

## 2015-04-16 VITALS — BP 152/68 | HR 73 | Ht 65.0 in | Wt 159.4 lb

## 2015-04-16 DIAGNOSIS — E785 Hyperlipidemia, unspecified: Secondary | ICD-10-CM

## 2015-04-16 DIAGNOSIS — I10 Essential (primary) hypertension: Secondary | ICD-10-CM | POA: Diagnosis not present

## 2015-04-16 DIAGNOSIS — I779 Disorder of arteries and arterioles, unspecified: Secondary | ICD-10-CM | POA: Diagnosis not present

## 2015-04-16 DIAGNOSIS — I6529 Occlusion and stenosis of unspecified carotid artery: Secondary | ICD-10-CM

## 2015-04-16 DIAGNOSIS — I739 Peripheral vascular disease, unspecified: Secondary | ICD-10-CM

## 2015-04-16 NOTE — Assessment & Plan Note (Signed)
History of hypertension blood pressure measured at 152/68. She is on benazepril and hydrochlorothiazide. Continue current meds at current dosing

## 2015-04-16 NOTE — Assessment & Plan Note (Signed)
History of moderate bilateral ICA stenosis. Her last Doppler was performed one year ago. She is neurologically symptomatic. We will continue to follow her by duplex ultrasound on annual basis.

## 2015-04-16 NOTE — Assessment & Plan Note (Signed)
History of hyperlipidemia on  Atorvastatin followed by her PCP

## 2015-04-16 NOTE — Progress Notes (Signed)
04/16/2015 [name]   1940-12-09  [id number]  Primary Physician Robert Bellow, MD Primary Cardiologist: Michele Harp MD [name]   HPI:  The patient is a very pleasant 74 year old mildly-overweight married Caucasian female, mother of 2 and grandmother of 3 grandchildren, whom I last saw in the office 04/10/14. She has a history of moderate left internal carotid artery stenosis which we have been following by duplex ultrasound on an annual basis. She continues to smoke 10-15 cigarettes a day despite counseling to the contrary. I spent greater than 3 minutes today talking to her about this. Her other problems include hypertension and hyperlipidemia. She denies chest pain or shortness of breath. She had a Myoview performed November 27, 2008, which was nonischemic. Dr. Karie Kirks follows her lipid profile.   Current Outpatient Prescriptions  Medication Sig Dispense Refill  . acyclovir (ZOVIRAX) 200 MG capsule Take 200 mg by mouth daily as needed (infection).     Marland Kitchen albuterol (PROVENTIL HFA;VENTOLIN HFA) 108 (90 BASE) MCG/ACT inhaler Inhale 2 puffs into the lungs every 6 (six) hours as needed for wheezing or shortness of breath.    . ALPRAZolam (XANAX) 0.5 MG tablet Take 0.25 tablets by mouth at bedtime as needed for sleep.     Marland Kitchen aspirin EC 81 MG tablet Take 81 mg by mouth 2 (two) times a week.    Marland Kitchen atorvastatin (LIPITOR) 40 MG tablet Take 40 mg by mouth daily.    . benazepril (LOTENSIN) 10 MG tablet Take 10 mg by mouth daily.    Marland Kitchen BIOTIN PO Take 2 tablets by mouth daily.    . Calcium Carbonate-Vitamin D (CALTRATE 600+D PO) Take 1 tablet by mouth daily.    . cephALEXin (KEFLEX) 500 MG capsule Take 2 capsules (1,000 mg total) by mouth 2 (two) times daily. 28 capsule 0  . cholecalciferol (VITAMIN D) 1000 UNITS tablet Take 1,000 Units by mouth daily.    . hydrochlorothiazide (MICROZIDE) 12.5 MG capsule Take 1 capsule by mouth daily.    Marland Kitchen levothyroxine (SYNTHROID,  LEVOTHROID) 88 MCG tablet Take 88 mcg by mouth daily before breakfast.    . metoprolol (LOPRESSOR) 50 MG tablet Take 25 mg by mouth 2 (two) times daily.    . Multiple Vitamins-Minerals (CENTRUM SILVER PO) Take 1 tablet by mouth daily.    . naproxen (NAPROSYN) 500 MG tablet Take 500 mg by mouth daily as needed for mild pain.     Marland Kitchen omeprazole (PRILOSEC) 20 MG capsule Take 20 mg by mouth 2 (two) times daily.    . potassium chloride SA (K-DUR,KLOR-CON) 20 MEQ tablet Take 1 tablet by mouth daily.     No current facility-administered medications for this visit.   Facility-Administered Medications Ordered in Other Visits  Medication Dose Route Frequency Provider Last Rate Last Dose  . fentaNYL (SUBLIMAZE) injection 25-50 mcg  25-50 mcg Intravenous Q5 min PRN Lerry Liner, MD        Allergies  Allergen Reactions  . Codeine Nausea And Vomiting    History   Social History  . Marital Status: Married    Spouse Name: N/A  . Number of Children: N/A  . Years of Education: N/A   Occupational History  . Not on file.   Social History Main Topics  . Smoking status: Current Every Day Smoker -- 0.25 packs/day for 55 years    Types: Cigarettes  . Smokeless tobacco: Not on file     Comment: patient has 1 cig every 2  days  . Alcohol Use: No  . Drug Use: No  . Sexual Activity: Yes    Birth Control/ Protection: Surgical   Other Topics Concern  . Not on file   Social History Narrative     Review of Systems: General: negative for chills, fever, night sweats or weight changes.  Cardiovascular: negative for chest pain, dyspnea on exertion, edema, orthopnea, palpitations, paroxysmal nocturnal dyspnea or shortness of breath Dermatological: negative for rash Respiratory: negative for cough or wheezing Urologic: negative for hematuria Abdominal: negative for nausea, vomiting, diarrhea, bright red blood per rectum, melena, or hematemesis Neurologic: negative for visual changes, syncope, or  dizziness All other systems reviewed and are otherwise negative except as noted above.    Blood pressure 152/68, pulse 73, height 5\' 5"  (1.651 m), weight 159 lb 6.4 oz (72.303 kg).  General appearance: alert and no distress Neck: no adenopathy, no carotid bruit, no JVD, supple, symmetrical, trachea midline and thyroid not enlarged, symmetric, no tenderness/mass/nodules Lungs: clear to auscultation bilaterally Heart: regular rate and rhythm, S1, S2 normal, no murmur, click, rub or gallop Extremities: extremities normal, atraumatic, no cyanosis or edema  EKG normal sinus rhythm at 73 with ST or T-wave changes. I personally reviewed his EKG  ASSESSMENT AND PLAN:   Hyperlipidemia History of hyperlipidemia on  Atorvastatin followed by her PCP  Essential hypertension History of hypertension blood pressure measured at 152/68. She is on benazepril and hydrochlorothiazide. Continue current meds at current dosing  Carotid artery disease History of moderate bilateral ICA stenosis. Her last Doppler was performed one year ago. She is neurologically symptomatic. We will continue to follow her by duplex ultrasound on annual basis.      Michele Harp MD FACP,FACC,FAHA, [facility] 04/16/2015 1:52 PM

## 2015-04-16 NOTE — Patient Instructions (Signed)
Your physician wants you to follow-up in: 1 year with Dr Berry. You will receive a reminder letter in the mail two months in advance. If you don't receive a letter, please call our office to schedule the follow-up appointment.  

## 2015-04-22 ENCOUNTER — Encounter: Payer: Self-pay | Admitting: Cardiovascular Disease

## 2015-05-01 ENCOUNTER — Ambulatory Visit (HOSPITAL_COMMUNITY)
Admission: RE | Admit: 2015-05-01 | Discharge: 2015-05-01 | Disposition: A | Discharge status: Home / Self Care | Payer: Medicare Other | Source: Ambulatory Visit | Attending: Cardiology | Admitting: Cardiology

## 2015-05-01 DIAGNOSIS — I6523 Occlusion and stenosis of bilateral carotid arteries: Secondary | ICD-10-CM | POA: Diagnosis not present

## 2015-05-01 DIAGNOSIS — I6529 Occlusion and stenosis of unspecified carotid artery: Secondary | ICD-10-CM | POA: Diagnosis present

## 2015-05-01 DIAGNOSIS — I10 Essential (primary) hypertension: Secondary | ICD-10-CM | POA: Diagnosis not present

## 2016-04-16 ENCOUNTER — Ambulatory Visit (INDEPENDENT_AMBULATORY_CARE_PROVIDER_SITE_OTHER): Payer: Medicare Other | Admitting: Cardiovascular Disease

## 2016-04-16 ENCOUNTER — Encounter: Payer: Self-pay | Admitting: Cardiovascular Disease

## 2016-04-16 DIAGNOSIS — I10 Essential (primary) hypertension: Secondary | ICD-10-CM

## 2016-04-16 DIAGNOSIS — I739 Peripheral vascular disease, unspecified: Principal | ICD-10-CM

## 2016-04-16 DIAGNOSIS — I779 Disorder of arteries and arterioles, unspecified: Secondary | ICD-10-CM

## 2016-04-16 NOTE — Patient Instructions (Signed)
Medication Instructions:  Your physician recommends that you continue on your current medications as directed. Please refer to the Current Medication list given to you today.   Labwork: Labwork will be requested from your primary care physician.   Testing/Procedures: Your physician has requested that you have a carotid duplex. This test is an ultrasound of the carotid arteries in your neck. It looks at blood flow through these arteries that supply the brain with blood. Allow one hour for this exam. There are no restrictions or special instructions.  IN AUGUST 2017.    Follow-Up: Your physician wants you to follow-up in: [place]. You will receive a reminder letter in the mail two months in advance. If you don't receive a letter, please call our office to schedule the follow-up appointment.   If you need a refill on your cardiac medications before your next appointment, please call your pharmacy.

## 2016-04-16 NOTE — Assessment & Plan Note (Signed)
History of asymptomatic moderate left ICA stenosis followed annually by duplex ultrasound most recently performed 05/01/15. He was found to be stable at that time.

## 2016-04-16 NOTE — Assessment & Plan Note (Signed)
History of hyperlipidemia on statin therapy followed by her PCP. 

## 2016-04-16 NOTE — Progress Notes (Signed)
04/16/2016 [name]   [birth year]  [id number]  Primary Physician Robert Bellow, MD Primary Cardiologist: Lorretta Harp MD [name], [place]  HPI:  The patient is a very pleasant 75 year old mildly-overweight married Caucasian female, mother of 2 and grandmother of 3 grandchildren, whom I last saw in the office 04/16/15. She has a history of moderate left internal carotid artery stenosis which we have been following by duplex ultrasound on an annual basis. She continues to smoke 10-15 cigarettes a day despite counseling to the contrary.  Her other problems include hypertension and hyperlipidemia. She denies chest pain or shortness of breath. She had a Myoview performed November 27, 2008, which was nonischemic. Dr. Karie Kirks follows her lipid profile.    Current Outpatient Prescriptions  Medication Sig Dispense Refill  . acyclovir (ZOVIRAX) 200 MG capsule Take 200 mg by mouth daily as needed (infection).     Marland Kitchen albuterol (PROVENTIL HFA;VENTOLIN HFA) 108 (90 BASE) MCG/ACT inhaler Inhale 2 puffs into the lungs every 6 (six) hours as needed for wheezing or shortness of breath.    . ALPRAZolam (XANAX) 0.5 MG tablet Take 0.25 tablets by mouth at bedtime as needed for sleep.     Marland Kitchen aspirin EC 81 MG tablet Take 81 mg by mouth 2 (two) times a week.    Marland Kitchen atorvastatin (LIPITOR) 40 MG tablet Take 40 mg by mouth daily.    . benazepril (LOTENSIN) 10 MG tablet Take 10 mg by mouth daily.    Marland Kitchen BIOTIN PO Take 2 tablets by mouth daily.    . Calcium Carbonate-Vitamin D (CALTRATE 600+D PO) Take 1 tablet by mouth daily.    . cephALEXin (KEFLEX) 500 MG capsule Take 2 capsules (1,000 mg total) by mouth 2 (two) times daily. 28 capsule 0  . cholecalciferol (VITAMIN D) 1000 UNITS tablet Take 1,000 Units by mouth daily.    . hydrochlorothiazide (MICROZIDE) 12.5 MG capsule Take 1 capsule by mouth daily.    Marland Kitchen levothyroxine (SYNTHROID, LEVOTHROID) 88 MCG tablet Take 88 mcg by mouth daily before breakfast.     . metoprolol (LOPRESSOR) 50 MG tablet Take 25 mg by mouth 2 (two) times daily.    . Multiple Vitamins-Minerals (CENTRUM SILVER PO) Take 1 tablet by mouth daily.    . naproxen (NAPROSYN) 500 MG tablet Take 500 mg by mouth daily as needed for mild pain.     Marland Kitchen omeprazole (PRILOSEC) 20 MG capsule Take 20 mg by mouth 2 (two) times daily.    . potassium chloride SA (K-DUR,KLOR-CON) 20 MEQ tablet Take 1 tablet by mouth daily.     No current facility-administered medications for this visit.    Facility-Administered Medications Ordered in Other Visits  Medication Dose Route Frequency Provider Last Rate Last Dose  . fentaNYL (SUBLIMAZE) injection 25-50 mcg  25-50 mcg Intravenous Q5 min PRN Lerry Liner, MD        Allergies  Allergen Reactions  . Codeine Nausea And Vomiting    Social History   Social History  . Marital status: Married    Spouse name: N/A  . Number of children: N/A  . Years of education: N/A   Occupational History  . Not on file.   Social History Main Topics  . Smoking status: Current Every Day Smoker    Packs/day: 0.25    Years: 55.00    Types: Cigarettes  . Smokeless tobacco: Not on file     Comment: patient has 1 cig every 2 days  .  Alcohol use No  . Drug use: No  . Sexual activity: Yes    Birth control/ protection: Surgical   Other Topics Concern  . Not on file   Social History Narrative  . No narrative on file     Review of Systems: General: negative for chills, fever, night sweats or weight changes.  Cardiovascular: negative for chest pain, dyspnea on exertion, edema, orthopnea, palpitations, paroxysmal nocturnal dyspnea or shortness of breath Dermatological: negative for rash Respiratory: negative for cough or wheezing Urologic: negative for hematuria Abdominal: negative for nausea, vomiting, diarrhea, bright red blood per rectum, melena, or hematemesis Neurologic: negative for visual changes, syncope, or dizziness All other systems reviewed and  are otherwise negative except as noted above.    Blood pressure (P) 138/71, pulse (P) 68, height (P) 5\' 5"  (1.651 m), weight (P) 160 lb 3.2 oz (72.7 kg).  General appearance: alert and no distress Neck: no adenopathy, no carotid bruit, no JVD, supple, symmetrical, trachea midline and thyroid not enlarged, symmetric, no tenderness/mass/nodules Lungs: clear to auscultation bilaterally Heart: regular rate and rhythm, S1, S2 normal, no murmur, click, rub or gallop Extremities: extremities normal, atraumatic, no cyanosis or edema  EKG normal sinus rhythm at 63 with the ST or T wave changes. I personally reviewed this EKG  ASSESSMENT AND PLAN:   Essential hypertension History of hypertension blood pressure measured today at 138/71. She is on Lotensin, hydrochlorothiazide and metoprolol. Continue current meds at current dosing  Hyperlipidemia History of hyperlipidemia on statin therapy followed by her PCP  Carotid artery disease History of asymptomatic moderate left ICA stenosis followed annually by duplex ultrasound most recently performed 05/01/15. He was found to be stable at that time.      Lorretta Harp MD FACP,FACC,FAHA, [facility] 04/16/2016 1:59 PM

## 2016-04-16 NOTE — Assessment & Plan Note (Signed)
History of hypertension blood pressure measured today at 138/71. She is on Lotensin, hydrochlorothiazide and metoprolol. Continue current meds at current dosing

## 2016-04-20 DIAGNOSIS — G5601 Carpal tunnel syndrome, right upper limb: Secondary | ICD-10-CM | POA: Insufficient documentation

## 2016-04-29 ENCOUNTER — Ambulatory Visit (HOSPITAL_COMMUNITY)
Admission: RE | Admit: 2016-04-29 | Discharge: 2016-04-29 | Disposition: A | Discharge status: Home / Self Care | Payer: Medicare Other | Source: Ambulatory Visit | Attending: Cardiology | Admitting: Cardiology

## 2016-04-29 DIAGNOSIS — I779 Disorder of arteries and arterioles, unspecified: Secondary | ICD-10-CM

## 2016-04-29 DIAGNOSIS — I6523 Occlusion and stenosis of bilateral carotid arteries: Secondary | ICD-10-CM | POA: Diagnosis not present

## 2016-04-29 DIAGNOSIS — J449 Chronic obstructive pulmonary disease, unspecified: Secondary | ICD-10-CM | POA: Insufficient documentation

## 2016-04-29 DIAGNOSIS — K219 Gastro-esophageal reflux disease without esophagitis: Secondary | ICD-10-CM | POA: Diagnosis not present

## 2016-04-29 DIAGNOSIS — E039 Hypothyroidism, unspecified: Secondary | ICD-10-CM | POA: Insufficient documentation

## 2016-04-29 DIAGNOSIS — I10 Essential (primary) hypertension: Secondary | ICD-10-CM

## 2016-04-29 DIAGNOSIS — E7800 Pure hypercholesterolemia, unspecified: Secondary | ICD-10-CM | POA: Diagnosis not present

## 2016-04-29 DIAGNOSIS — I739 Peripheral vascular disease, unspecified: Secondary | ICD-10-CM

## 2016-04-30 ENCOUNTER — Other Ambulatory Visit: Payer: Self-pay | Admitting: *Deleted

## 2016-04-30 DIAGNOSIS — I779 Disorder of arteries and arterioles, unspecified: Secondary | ICD-10-CM

## 2016-04-30 DIAGNOSIS — I739 Peripheral vascular disease, unspecified: Principal | ICD-10-CM

## 2016-05-18 ENCOUNTER — Other Ambulatory Visit: Payer: Self-pay

## 2016-06-30 ENCOUNTER — Telehealth: Payer: Self-pay | Admitting: *Deleted

## 2016-06-30 NOTE — Telephone Encounter (Signed)
Okay to hold aspirin for her carpal tunnel procedure.

## 2016-06-30 NOTE — Telephone Encounter (Signed)
Requesting surgical clearance:   1. Type of surgery: Right carpel tunnel release  2. Surgeon: Dr Charlotte Crumb  3. Surgical date: July 05, 2016  4. Medications that need to be held: aspirin  5. CAD: No     6. I will defer to: Dr Gwenlyn Found   Attn: Cyndee Brightly, RN Fax- [phone] Phone- [phone]

## 2016-06-30 NOTE — Telephone Encounter (Signed)
Patient may hold aspirin for 5-7 days prior to procedure per pharmacy protocol. Encounter routed to number provided.

## 2016-07-15 ENCOUNTER — Other Ambulatory Visit (HOSPITAL_COMMUNITY): Payer: Self-pay | Admitting: Preventative Medicine

## 2016-07-15 DIAGNOSIS — R9389 Abnormal findings on diagnostic imaging of other specified body structures: Secondary | ICD-10-CM

## 2016-07-23 ENCOUNTER — Ambulatory Visit (HOSPITAL_COMMUNITY)
Admission: RE | Admit: 2016-07-23 | Discharge: 2016-07-23 | Disposition: A | Discharge status: Home / Self Care | Payer: Medicare Other | Source: Ambulatory Visit | Attending: Preventative Medicine | Admitting: Preventative Medicine

## 2016-07-23 DIAGNOSIS — R918 Other nonspecific abnormal finding of lung field: Secondary | ICD-10-CM | POA: Insufficient documentation

## 2016-07-23 DIAGNOSIS — I2510 Atherosclerotic heart disease of native coronary artery without angina pectoris: Secondary | ICD-10-CM | POA: Diagnosis not present

## 2016-07-23 DIAGNOSIS — D1803 Hemangioma of intra-abdominal structures: Secondary | ICD-10-CM | POA: Insufficient documentation

## 2016-07-23 DIAGNOSIS — R9389 Abnormal findings on diagnostic imaging of other specified body structures: Secondary | ICD-10-CM

## 2016-07-23 DIAGNOSIS — E278 Other specified disorders of adrenal gland: Secondary | ICD-10-CM | POA: Insufficient documentation

## 2016-07-23 DIAGNOSIS — R938 Abnormal findings on diagnostic imaging of other specified body structures: Secondary | ICD-10-CM | POA: Insufficient documentation

## 2016-07-23 MED ORDER — IOPAMIDOL (ISOVUE-300) INJECTION 61%
100.0000 mL | Freq: Once | INTRAVENOUS | Status: AC | PRN
  Administered 2016-07-23: 75 mL via INTRAVENOUS

## 2016-09-20 HISTORY — PX: BREAST EXCISIONAL BIOPSY: SUR124

## 2017-04-12 ENCOUNTER — Other Ambulatory Visit: Payer: Self-pay | Admitting: General Surgery

## 2017-04-12 ENCOUNTER — Ambulatory Visit (INDEPENDENT_AMBULATORY_CARE_PROVIDER_SITE_OTHER): Payer: Medicare Other | Admitting: General Surgery

## 2017-04-12 VITALS — BP 158/55 | HR 73 | Temp 97.3°F | Resp 18 | Ht 65.0 in | Wt 163.0 lb

## 2017-04-12 DIAGNOSIS — N6320 Unspecified lump in the left breast, unspecified quadrant: Secondary | ICD-10-CM

## 2017-04-12 DIAGNOSIS — R229 Localized swelling, mass and lump, unspecified: Principal | ICD-10-CM

## 2017-04-12 DIAGNOSIS — IMO0002 Reserved for concepts with insufficient information to code with codable children: Secondary | ICD-10-CM

## 2017-04-12 NOTE — Patient Instructions (Signed)
Breast Biopsy  A breast biopsy is a procedure in which a sample of suspicious breast tissue is removed from your breast. Following the procedure, the tissue or liquid that is removed from the breast is examined under a microscope to see if cancerous cells are present. You may need a breast biopsy if you have:  · Any undiagnosed breast mass (tumor).  · Nipple abnormalities, dimpling, crusting, or ulcerations.  · Abnormal discharge from the nipple, especially blood.  · Redness, swelling, and pain of the breast.  · Calcium deposits (calcifications) or abnormalities seen on a mammogram, ultrasound results, or MRI results.  · Suspicious changes in the breast seen on your mammogram.    If the breast abnormality is found to be cancerous (malignant), a breast biopsy can help to determine what the best treatment is for you. There are many different types of breast biopsies. Talk with your health care provider about your options and which type is best for you.  Tell a health care provider about:  · Any allergies you have.  · All medicines you are taking, including vitamins, herbs, eye drops, creams, and over-the-counter medicines.  · Any problems you or family members have had with anesthetic medicines.  · Any blood disorders you have.  · Any surgeries you have had.  · Any medical conditions you have.  · Whether you are pregnant or may be pregnant.  What are the risks?  Generally, this is a safe procedure. However, problems may occur, including:  · Bleeding.  · Infection.  · Discomfort. This is temporary.  · Allergic reactions to medicines.  · Bruising and swelling of the breast.  · Alteration in the shape of the breast.  · Damage to other tissues.  · Not finding the lump or abnormality.  · Needing more surgery.    What happens before the procedure?  · Plan to have someone take you home after the procedure.  · Do not use any tobacco products, such as cigarettes, chewing tobacco, and e-cigarettes. If you need help quitting,  ask your health care provider.  · Do not drink alcohol for 24 hours before the procedure.  · Ask your health care provider about:  ? Changing or stopping your regular medicines. This is especially important if you are taking diabetes medicines or blood thinners.  ? Taking medicines such as aspirin and ibuprofen. These medicines can thin your blood. Do not take these medicines before your procedure if your health care provider instructs you not to.  · Wear a good support bra to the procedure.  · Ask your health care provider how your surgical site will be marked or identified.  · You may be given antibiotic medicine to help prevent infection.  · Your health care provider may perform a procedure to place a wire (needle localization) or a seed that gives off radiation (radioactive seed localization) in the breast lump. A mammogram, ultrasound, MRI, or a combination of these techniques will be done during this procedure to identify the location of the breast abnormality. The imaging technique used will depend on the type of biopsy you are having. The wire or seed will help the health care provider locate the lump when performing the biopsy, especially if the lump cannot be felt.  What happens during the procedure?  You may be given one or both of the following:  · A medicine to numb the breast area (local anesthetic).  · A medicine to help you relax (sedative) during the procedure.      The following are the different types of biopsies that can be performed.  Fine-Needle Aspiration  A thin needle will be attached to a syringe and inserted into a breast cyst. Fluid and cells will be removed. This technique is not as common as a core needle biopsy.  Core Needle Biopsy  A wide, hollow needle (core needle) will be inserted into a breast lump multiple times to remove tissue samples or cores.  Stereotactic Biopsy  You will lie face-down on a table. Your breast will pass through an opening in the table and will be gently  compressed into a fixed position. X-ray equipment and a computer will be used to locate the breast lump. The surgeon will use this information to collect several samples of tissue using a needle collection device.  Vacuum-Assisted Biopsy  A small incision (less than ¼ inch) will be made in your breast. A biopsy device that includes a hollow needle and vacuum will be passed through the incision and into the breast tissue. The vacuum will gently draw abnormal breast tissue into the needle to remove it. No stitches (sutures) will be needed. The incision will be covered with a bandage (dressing). In this type of biopsy, a larger tissue sample is removed than in a regular core needle biopsy.  Ultrasound-Guided Core Needle Biopsy  A high-frequency ultrasound will be used to help guide the core needle to the area of the mass or abnormality. An incision will be made to insert the needle. Then tissue samples will be removed.  Surgical Biopsy  This method requires an incision in the breast to remove part or all of the suspicious tissue. After the tissue is removed, the skin over the area will be closed with sutures and covered with a dressing. There are two types of surgical biopsies:  · Incisional biopsy. The surgeon will remove part of the breast lump.  · Excisional biopsy. The surgeon will attempt to remove the whole breast lump or as much of it as possible.    After any of these procedures, the tissue or liquid that was removed will be examined under a microscope.  What happens after the procedure?  · You will be taken to the recovery area. If you are doing well and have no problems, you will be allowed to go home.  · You may notice bruising on your breast. This is normal.  · You may have a pressure dressing applied on your breast for 24-48 hours. A pressure dressing is a bandage that is wrapped tightly around the chest to stop fluid from collecting underneath tissues. You may also be advised to wear a supportive bra  during this time.  · Do not drive for 24 hours if you received a sedative.  This information is not intended to replace advice given to you by your health care provider. Make sure you discuss any questions you have with your health care provider.  Document Released: 09/06/2005 Document Revised: 01/15/2016 Document Reviewed: 06/10/2015  [employer] Interactive Patient Education © 2018 [employer]

## 2017-04-12 NOTE — H&P (Signed)
[name]; [id number]; [birth year]   HPI  patient is a 76 year old white female who was referred to my care by Dr. Karie Kirks for evaluation and treatment of a left breast mass seen on mammogram.  Patient did not feel the mass.  Was seen on screening mammography.  She does have a sister who had breast cancer.  She denies nipple discharge.  Patient underwent core biopsy which revealed abnormal breast tissue, but malignancy was not identified.  It was suggested that the patient needs a formal breast biopsy to rule out malignancy. Past Medical History:  Diagnosis Date  . Arthritis   . Carotid artery disease ([place])   . COPD (chronic obstructive pulmonary disease) ([place])   . Dysrhythmia   . GERD (gastroesophageal reflux disease)   . Hypercholesteremia   . Hypertension   . Hypothyroidism   . Shortness of breath     Past Surgical History:  Procedure Laterality Date  . ABDOMINAL HYSTERECTOMY    . APPENDECTOMY    . CATARACT EXTRACTION W/PHACO Left 09/30/2014   Procedure: CATARACT EXTRACTION PHACO AND INTRAOCULAR LENS PLACEMENT LEFT EYE;  Surgeon: Tonny Branch, MD;  Location: [facility];  Service: Ophthalmology;  Laterality: Left;  CDE:8.79  . CATARACT EXTRACTION W/PHACO Right 10/17/2014   Procedure: CATARACT EXTRACTION PHACO AND INTRAOCULAR LENS PLACEMENT RIGHT EYE;  Surgeon: Tonny Branch, MD;  Location: [facility];  Service: Ophthalmology;  Laterality: Right;  CDE: 7.80  . CHOLECYSTECTOMY N/A 04/30/2013   Procedure: Attempted LAPAROSCOPIC CHOLECYSTECTOMY ; converted to open procedure @ 1510;  Surgeon: Scherry Ran, MD;  Location: [facility];  Service: General;  Laterality: N/A;  . CHOLECYSTECTOMY N/A 04/30/2013   Procedure: CHOLECYSTECTOMY;  Surgeon: Scherry Ran, MD;  Location: [facility];  Service: General;  Laterality: N/A;  procedure started @ 1520  . TUBAL LIGATION      Family History  Problem Relation Age of Onset  . Heart failure Mother   . Cancer Father     Current Outpatient Prescriptions on  File Prior to Visit  Medication Sig Dispense Refill  . acyclovir (ZOVIRAX) 200 MG capsule Take 200 mg by mouth daily as needed (infection).     Marland Kitchen albuterol (PROVENTIL HFA;VENTOLIN HFA) 108 (90 BASE) MCG/ACT inhaler Inhale 2 puffs into the lungs every 6 (six) hours as needed for wheezing or shortness of breath.    . ALPRAZolam (XANAX) 0.5 MG tablet Take 0.25 tablets by mouth at bedtime as needed for sleep.     Marland Kitchen aspirin EC 81 MG tablet Take 81 mg by mouth 2 (two) times a week.    Marland Kitchen atorvastatin (LIPITOR) 40 MG tablet Take 40 mg by mouth daily.    . benazepril (LOTENSIN) 10 MG tablet Take 10 mg by mouth daily.    Marland Kitchen BIOTIN PO Take 2 tablets by mouth daily.    . Calcium Carbonate-Vitamin D (CALTRATE 600+D PO) Take 1 tablet by mouth daily.    . cephALEXin (KEFLEX) 500 MG capsule Take 2 capsules (1,000 mg total) by mouth 2 (two) times daily. 28 capsule 0  . cholecalciferol (VITAMIN D) 1000 UNITS tablet Take 1,000 Units by mouth daily.    . hydrochlorothiazide (MICROZIDE) 12.5 MG capsule Take 1 capsule by mouth daily.    Marland Kitchen levothyroxine (SYNTHROID, LEVOTHROID) 88 MCG tablet Take 88 mcg by mouth daily before breakfast.    . metoprolol (LOPRESSOR) 50 MG tablet Take 25 mg by mouth 2 (two) times daily.    . Multiple Vitamins-Minerals (CENTRUM SILVER PO) Take 1 tablet  by mouth daily.    . naproxen (NAPROSYN) 500 MG tablet Take 500 mg by mouth daily as needed for mild pain.     Marland Kitchen omeprazole (PRILOSEC) 20 MG capsule Take 20 mg by mouth 2 (two) times daily.    . potassium chloride SA (K-DUR,KLOR-CON) 20 MEQ tablet Take 1 tablet by mouth daily.     Current Facility-Administered Medications on File Prior to Visit  Medication Dose Route Frequency Provider Last Rate Last Dose  . fentaNYL (SUBLIMAZE) injection 25-50 mcg  25-50 mcg Intravenous Q5 min PRN Lerry Liner, MD        Allergies  Allergen Reactions  . Codeine Nausea And Vomiting    History  Alcohol Use No    History  Smoking Status  .  Current Every Day Smoker  . Packs/day: 0.25  . Years: 55.00  . Types: Cigarettes  Smokeless Tobacco  . Never Used    Comment: patient has 1 cig every 2 days    Review of Systems  Constitutional: Negative.   HENT: Positive for sinus pain.   Eyes: Negative.   Respiratory: Positive for shortness of breath and wheezing.   Cardiovascular: Negative.   Gastrointestinal: Positive for heartburn.  Genitourinary: Negative.   Musculoskeletal: Positive for back pain and joint pain.  Skin: Negative.   Neurological: Negative.   Endo/Heme/Allergies: Negative.   Psychiatric/Behavioral: Negative.     Objective   Vitals:   04/12/17 1455  BP: (!) 158/55  Pulse: 73  Resp: 18  Temp: (!) 97.3 F (36.3 C)    Physical Exam  Constitutional: She is oriented to person, place, and time and well-developed, well-nourished, and in no distress.  HENT:  Head: Normocephalic and atraumatic.  Neck: Normal range of motion. Neck supple.  Cardiovascular: Normal rate, regular rhythm and normal heart sounds.   No murmur heard. Pulmonary/Chest: Effort normal and breath sounds normal. She has no wheezes. She has no rales.  Neurological: She is alert and oriented to person, place, and time.  Skin: Skin is warm and dry.  Vitals reviewed.   Breast examination reveals no dominant mass, nipple discharge, dimpling in either breast.  Axillas are negative for palpable nodes.    Mammography and addendum pathology notes reviewed  Assessment   left breast mass seen on mammography Plan    patient is scheduled for left breast biopsy after needle localization on 05/04/2017.  The risks and benefits of the procedure including bleeding, infection, the possibility of malignancy were fully explained to the patient, who gave informed consent.

## 2017-04-12 NOTE — Progress Notes (Signed)
[name]; [id number]; Feb 08, 1941   HPI  patient is a 76 year old white female who was referred to my care by Dr. Karie Kirks for evaluation and treatment of a left breast mass seen on mammogram.  Patient did not feel the mass.  Was seen on screening mammography.  She does have a sister who had breast cancer.  She denies nipple discharge.  Patient underwent core biopsy which revealed abnormal breast tissue, but malignancy was not identified.  It was suggested that the patient needs a formal breast biopsy to rule out malignancy. Past Medical History:  Diagnosis Date  . Arthritis   . Carotid artery disease ([place])   . COPD (chronic obstructive pulmonary disease) ([place])   . Dysrhythmia   . GERD (gastroesophageal reflux disease)   . Hypercholesteremia   . Hypertension   . Hypothyroidism   . Shortness of breath     Past Surgical History:  Procedure Laterality Date  . ABDOMINAL HYSTERECTOMY    . APPENDECTOMY    . CATARACT EXTRACTION W/PHACO Left 09/30/2014   Procedure: CATARACT EXTRACTION PHACO AND INTRAOCULAR LENS PLACEMENT LEFT EYE;  Surgeon: Tonny Branch, MD;  Location: [facility];  Service: Ophthalmology;  Laterality: Left;  CDE:8.79  . CATARACT EXTRACTION W/PHACO Right 10/17/2014   Procedure: CATARACT EXTRACTION PHACO AND INTRAOCULAR LENS PLACEMENT RIGHT EYE;  Surgeon: Tonny Branch, MD;  Location: [facility];  Service: Ophthalmology;  Laterality: Right;  CDE: 7.80  . CHOLECYSTECTOMY N/A 04/30/2013   Procedure: Attempted LAPAROSCOPIC CHOLECYSTECTOMY ; converted to open procedure @ 1510;  Surgeon: Scherry Ran, MD;  Location: [facility];  Service: General;  Laterality: N/A;  . CHOLECYSTECTOMY N/A 04/30/2013   Procedure: CHOLECYSTECTOMY;  Surgeon: Scherry Ran, MD;  Location: [facility];  Service: General;  Laterality: N/A;  procedure started @ 1520  . TUBAL LIGATION      Family History  Problem Relation Age of Onset  . Heart failure Mother   . Cancer Father     Current Outpatient Prescriptions on  File Prior to Visit  Medication Sig Dispense Refill  . acyclovir (ZOVIRAX) 200 MG capsule Take 200 mg by mouth daily as needed (infection).     Marland Kitchen albuterol (PROVENTIL HFA;VENTOLIN HFA) 108 (90 BASE) MCG/ACT inhaler Inhale 2 puffs into the lungs every 6 (six) hours as needed for wheezing or shortness of breath.    . ALPRAZolam (XANAX) 0.5 MG tablet Take 0.25 tablets by mouth at bedtime as needed for sleep.     Marland Kitchen aspirin EC 81 MG tablet Take 81 mg by mouth 2 (two) times a week.    Marland Kitchen atorvastatin (LIPITOR) 40 MG tablet Take 40 mg by mouth daily.    . benazepril (LOTENSIN) 10 MG tablet Take 10 mg by mouth daily.    Marland Kitchen BIOTIN PO Take 2 tablets by mouth daily.    . Calcium Carbonate-Vitamin D (CALTRATE 600+D PO) Take 1 tablet by mouth daily.    . cephALEXin (KEFLEX) 500 MG capsule Take 2 capsules (1,000 mg total) by mouth 2 (two) times daily. 28 capsule 0  . cholecalciferol (VITAMIN D) 1000 UNITS tablet Take 1,000 Units by mouth daily.    . hydrochlorothiazide (MICROZIDE) 12.5 MG capsule Take 1 capsule by mouth daily.    Marland Kitchen levothyroxine (SYNTHROID, LEVOTHROID) 88 MCG tablet Take 88 mcg by mouth daily before breakfast.    . metoprolol (LOPRESSOR) 50 MG tablet Take 25 mg by mouth 2 (two) times daily.    . Multiple Vitamins-Minerals (CENTRUM SILVER PO) Take 1 tablet  by mouth daily.    . naproxen (NAPROSYN) 500 MG tablet Take 500 mg by mouth daily as needed for mild pain.     Marland Kitchen omeprazole (PRILOSEC) 20 MG capsule Take 20 mg by mouth 2 (two) times daily.    . potassium chloride SA (K-DUR,KLOR-CON) 20 MEQ tablet Take 1 tablet by mouth daily.     Current Facility-Administered Medications on File Prior to Visit  Medication Dose Route Frequency Provider Last Rate Last Dose  . fentaNYL (SUBLIMAZE) injection 25-50 mcg  25-50 mcg Intravenous Q5 min PRN Lerry Liner, MD        Allergies  Allergen Reactions  . Codeine Nausea And Vomiting    History  Alcohol Use No    History  Smoking Status  .  Current Every Day Smoker  . Packs/day: 0.25  . Years: 55.00  . Types: Cigarettes  Smokeless Tobacco  . Never Used    Comment: patient has 1 cig every 2 days    Review of Systems  Constitutional: Negative.   HENT: Positive for sinus pain.   Eyes: Negative.   Respiratory: Positive for shortness of breath and wheezing.   Cardiovascular: Negative.   Gastrointestinal: Positive for heartburn.  Genitourinary: Negative.   Musculoskeletal: Positive for back pain and joint pain.  Skin: Negative.   Neurological: Negative.   Endo/Heme/Allergies: Negative.   Psychiatric/Behavioral: Negative.     Objective   Vitals:   04/12/17 1455  BP: (!) 158/55  Pulse: 73  Resp: 18  Temp: (!) 97.3 F (36.3 C)    Physical Exam  Constitutional: She is oriented to person, place, and time and well-developed, well-nourished, and in no distress.  HENT:  Head: Normocephalic and atraumatic.  Neck: Normal range of motion. Neck supple.  Cardiovascular: Normal rate, regular rhythm and normal heart sounds.   No murmur heard. Pulmonary/Chest: Effort normal and breath sounds normal. She has no wheezes. She has no rales.  Neurological: She is alert and oriented to person, place, and time.  Skin: Skin is warm and dry.  Vitals reviewed.   Breast examination reveals no dominant mass, nipple discharge, dimpling in either breast.  Axillas are negative for palpable nodes.    Mammography and addendum pathology notes reviewed  Assessment   left breast mass seen on mammography Plan    patient is scheduled for left breast biopsy after needle localization on 05/04/2017.  The risks and benefits of the procedure including bleeding, infection, the possibility of malignancy were fully explained to the patient, who gave informed consent.

## 2017-04-20 ENCOUNTER — Ambulatory Visit (INDEPENDENT_AMBULATORY_CARE_PROVIDER_SITE_OTHER): Payer: Medicare Other | Admitting: Cardiovascular Disease

## 2017-04-20 ENCOUNTER — Encounter: Payer: Self-pay | Admitting: Cardiovascular Disease

## 2017-04-20 VITALS — BP 152/62 | HR 65 | Ht 65.0 in | Wt 163.8 lb

## 2017-04-20 DIAGNOSIS — E7800 Pure hypercholesterolemia, unspecified: Secondary | ICD-10-CM

## 2017-04-20 DIAGNOSIS — I739 Peripheral vascular disease, unspecified: Secondary | ICD-10-CM

## 2017-04-20 DIAGNOSIS — I779 Disorder of arteries and arterioles, unspecified: Secondary | ICD-10-CM | POA: Diagnosis not present

## 2017-04-20 DIAGNOSIS — I10 Essential (primary) hypertension: Secondary | ICD-10-CM

## 2017-04-20 NOTE — Assessment & Plan Note (Signed)
History of essential hypertension blood pressure measures 152/62. She is on Lotensin, hydrochlorothiazide and metoprolol. Continue current meds at current dosing

## 2017-04-20 NOTE — Progress Notes (Signed)
04/20/2017 [name]   09-Jul-1941  [id number]  Primary Physician Lemmie Evens, MD Primary Cardiologist: Lorretta Harp MD [name], [place]   mildly-overweight married Caucasian female, mother of 2 and grandmother of 3 grandchildren, whom I last saw in the office 04/08/16. She has a history of moderate left internal carotid artery stenosis which we have been following by duplex ultrasound on an annual basis. She continues to smoke 10-15 cigarettes a day despite counseling to the contrary.  Her other problems include hypertension and hyperlipidemia. She denies chest pain or shortness of breath. She had a Myoview performed November 27, 2008, which was nonischemic. Dr. Karie Kirks follows her lipid profile. Her last carotid Doppler study performed 04/29/16 revealed moderate left ICA stenosis.    Current Meds  Medication Sig  . acyclovir (ZOVIRAX) 200 MG capsule Take 200 mg by mouth daily as needed (infection).   Marland Kitchen albuterol (PROVENTIL HFA;VENTOLIN HFA) 108 (90 BASE) MCG/ACT inhaler Inhale 2 puffs into the lungs every 6 (six) hours as needed for wheezing or shortness of breath.  . ALPRAZolam (XANAX) 0.5 MG tablet Take 0.25 tablets by mouth at bedtime as needed for sleep.   Marland Kitchen aspirin EC 81 MG tablet Take 81 mg by mouth 2 (two) times a week.  Marland Kitchen atorvastatin (LIPITOR) 40 MG tablet Take 40 mg by mouth daily.  . benazepril (LOTENSIN) 10 MG tablet Take 10 mg by mouth daily.  Marland Kitchen BIOTIN PO Take 2 tablets by mouth daily.  . Calcium Carbonate-Vitamin D (CALTRATE 600+D PO) Take 1 tablet by mouth daily.  . cephALEXin (KEFLEX) 500 MG capsule Take 2 capsules (1,000 mg total) by mouth 2 (two) times daily.  . cholecalciferol (VITAMIN D) 1000 UNITS tablet Take 1,000 Units by mouth daily.  . hydrochlorothiazide (MICROZIDE) 12.5 MG capsule Take 1 capsule by mouth daily.  Marland Kitchen levothyroxine (SYNTHROID, LEVOTHROID) 88 MCG tablet Take 88 mcg by mouth daily before breakfast.  . metoprolol (LOPRESSOR) 50 MG  tablet Take 25 mg by mouth 2 (two) times daily.  . Multiple Vitamins-Minerals (CENTRUM SILVER PO) Take 1 tablet by mouth daily.  . naproxen (NAPROSYN) 500 MG tablet Take 500 mg by mouth daily as needed for mild pain.   Marland Kitchen omeprazole (PRILOSEC) 20 MG capsule Take 20 mg by mouth 2 (two) times daily.  . potassium chloride SA (K-DUR,KLOR-CON) 20 MEQ tablet Take 1 tablet by mouth daily.     Allergies  Allergen Reactions  . Codeine Nausea And Vomiting    Social History   Social History  . Marital status: Married    Spouse name: N/A  . Number of children: N/A  . Years of education: N/A   Occupational History  . Not on file.   Social History Main Topics  . Smoking status: Current Every Day Smoker    Packs/day: 0.25    Years: 55.00    Types: Cigarettes  . Smokeless tobacco: Never Used     Comment: patient has 1 cig every 2 days  . Alcohol use No  . Drug use: No  . Sexual activity: Yes    Birth control/ protection: Surgical   Other Topics Concern  . Not on file   Social History Narrative  . No narrative on file     Review of Systems: General: negative for chills, fever, night sweats or weight changes.  Cardiovascular: negative for chest pain, dyspnea on exertion, edema, orthopnea, palpitations, paroxysmal nocturnal dyspnea or shortness of breath Dermatological: negative for rash Respiratory: negative  for cough or wheezing Urologic: negative for hematuria Abdominal: negative for nausea, vomiting, diarrhea, bright red blood per rectum, melena, or hematemesis Neurologic: negative for visual changes, syncope, or dizziness All other systems reviewed and are otherwise negative except as noted above.    Blood pressure (!) 152/62, pulse 65, height 5\' 5"  (1.651 m), weight 163 lb 12.8 oz (74.3 kg).  General appearance: alert and no distress Neck: no adenopathy, no carotid bruit, no JVD, supple, symmetrical, trachea midline and thyroid not enlarged, symmetric, no  tenderness/mass/nodules Lungs: clear to auscultation bilaterally Heart: regular rate and rhythm, S1, S2 normal, no murmur, click, rub or gallop Extremities: extremities normal, atraumatic, no cyanosis or edema  EKG sinus rhythm at 65% septal Q waves. I personally reviewed this EKG.  ASSESSMENT AND PLAN:   Essential hypertension History of essential hypertension blood pressure measures 152/62. She is on Lotensin, hydrochlorothiazide and metoprolol. Continue current meds at current dosing  Hyperlipidemia History of hyperlipidemia on statin therapy followed by her PCP  Carotid artery disease History of moderate left ICA stenosis by ultrasound 04/29/16. We will repeat carotid Doppler studies.      Lorretta Harp MD FACP,FACC,FAHA, [facility] 04/20/2017 3:07 PM

## 2017-04-20 NOTE — Assessment & Plan Note (Signed)
History of hyperlipidemia on statin therapy followed by her PCP. 

## 2017-04-20 NOTE — Patient Instructions (Addendum)
Medication Instructions: Your physician recommends that you continue on your current medications as directed. Please refer to the Current Medication list given to you today.  Testing: Your physician has requested that you have a carotid duplex (now). This test is an ultrasound of the carotid arteries in your neck. It looks at blood flow through these arteries that supply the brain with blood. Allow one hour for this exam. There are no restrictions or special instructions.  Labwork: I will request lab work from Dr. Vickey Sages office.  Follow-Up: Your physician wants you to follow-up in: 1 year with Dr. Gwenlyn Found. You will receive a reminder letter in the mail two months in advance. If you don't receive a letter, please call our office to schedule the follow-up appointment.  If you need a refill on your cardiac medications before your next appointment, please call your pharmacy.

## 2017-04-20 NOTE — Assessment & Plan Note (Signed)
History of moderate left ICA stenosis by ultrasound 04/29/16. We will repeat carotid Doppler studies.

## 2017-04-26 NOTE — Patient Instructions (Signed)
[name]  04/26/2017     @PREFPERIOPPHARMACY @   Your procedure is scheduled on  05/04/2017   Report to [facility] at  730  A.M.  Call this number if you have problems the morning of surgery:  [phone]   Remember:  Do not eat food or drink liquids after midnight.  Take these medicines the morning of surgery with A SIP OF WATER  Lotensin, levothyroxine, metoprolol, prilosec. Use your inhaler before you come.   Do not wear jewelry, make-up or nail polish.  Do not wear lotions, powders, or perfumes, or deoderant.  Do not shave 48 hours prior to surgery.  Men may shave face and neck.  Do not bring valuables to the hospital.  [facility] is not responsible for any belongings or valuables.  Contacts, dentures or bridgework may not be worn into surgery.  Leave your suitcase in the car.  After surgery it may be brought to your room.  For patients admitted to the hospital, discharge time will be determined by your treatment team.  Patients discharged the day of surgery will not be allowed to drive home.   Name and phone number of your driver:   family Special instructions:  None  Please read over the following fact sheets that you were given. Anesthesia Post-op Instructions and Care and Recovery After Surgery       Breast Biopsy A breast biopsy is a test during which a sample of tissue is taken from your breast. The breast tissue is looked at under a microscope for cancer cells. What happens before the procedure?  Plan to have someone take you home after the test.  Do not use tobacco products. These include cigarettes, chewing tobacco, or e-cigarettes. If you need help quitting, ask your doctor.  Do not drink alcohol for 24 hours before the test.  Ask your doctor about: ? Changing or stopping your normal medicines. This is important if you take diabetes medicines or blood thinners. ? Taking medicines such as aspirin and ibuprofen. These medicines can  thin your blood. Do not take these medicines before your procedure if your doctor tells you not to.  Wear a good support bra to the test.  Ask your doctor how your surgical site will be marked or identified.  You may be given antibiotic medicine to help prevent infection.  You may be checked for extra fluid in your body (lymphedema).  Your doctor may place a wire or a seed in the lump. The wire or seed gives off radiation. This will help your doctor to see the lump during the biopsy. What happens during the procedure? You may be given the following:  A medicine to numb the breast area (local anesthetic).  A medicine to help you relax (sedative).  There are different types of breast biopsies. Each type is described below. Fine-Needle Aspiration  A needle will be put into the breast lump.  The needle will take out fluid and cells from the lump. Core-Needle Biopsy  A needle will be put into the breast lump.  The needle will be put into your breast several times.  The needle will remove breast tissue. Stereotactic Biopsy  You will lie on a table on your belly. Your breast will pass through a hole in the table. Your breast will be held in place.  X-rays and a computer will be used to locate the breast lump.  A needle will  be used to remove tissue samples from your breast. Vacuum-Assisted Biopsy  A small cut (incision) will be made in your breast.  A biopsy device will be put through the cut and into the breast tissue.  The biopsy device will draw abnormal breast tissue into the biopsy device.  A large tissue sample will often be removed.  No stitches will be needed. Ultrasound-Guided Core-Needle Biopsy  Ultrasound imaging will help guide the needle into the area of the breast that is not normal.  A cut will be made in the breast. The needle will be put into the breast lump.  Tissue samples will be taken out. Surgical Biopsy  A cut will be made in the breast to  remove tissue.  The cut will be closed with stitches and covered with a bandage.  There are two types: ? Incisional biopsy. Your doctor will remove part of the breast lump. ? Excisional biopsy. Your doctor will try to remove the whole breast lump or as much as possible. All tissue or fluid samples will be looked at under a microscope. What happens after the procedure?  You will be able to go home when you are doing well and you are not having problems.  You may have bruising on your breast. This is normal.  A pressure bandage (dressing) may be put on your breast. It may be left on for 24-48 hours. This type of bandage is wrapped tightly around your chest. It helps to stop fluid from building up under tissues. You may also need to wear a supportive bra during this time.  Do not drive for 24 hours if you received a sedative. This information is not intended to replace advice given to you by your health care provider. Make sure you discuss any questions you have with your health care provider. Document Released: 11/29/2011 Document Revised: 05/13/2016 Document Reviewed: 06/10/2015 Elsevier Interactive Patient Education  2018 [place].  Breast Biopsy, Care After Refer to this sheet in the next few weeks. These instructions provide you with information about caring for yourself after your procedure. Your health care provider may also give you more specific instructions. Your treatment has been planned according to current medical practices, but problems sometimes occur. Call your health care provider if you have any problems or questions after your procedure. What can I expect after the procedure? After your procedure, it is common to have:  Bruising on your breast.  Numbness, tingling, or pain near your biopsy site.  Follow these instructions at home: Medicines  Take over-the-counter and prescription medicines only as told by your health care provider.  Do not drive for 24 hours  if you received a sedative.  Do not drink alcohol while taking pain medicine.  Do not drive or operate heavy machinery while taking prescription pain medicine. Biopsy Site Care   Follow instructions from your health care provider about how to take care of your incision or puncture site. Make sure you: ? Wash your hands with soap and water before you change your dressing. If soap and water are not available, use hand sanitizer. ? Change any bandages (dressings) as told by your health care provider. ? Leave any stitches (sutures), skin glue, or adhesive strips in place. These skin closures may need to stay in place for 2 weeks or longer. If adhesive strip edges start to loosen and curl up, you may trim the loose edges. Do not remove adhesive strips completely unless your health care provider tells you to do  that.  If you have sutures, keep them dry when bathing.  Check your incision or puncture area every day for signs of infection. Check for: ? More redness, swelling, or pain. ? More fluid or blood. ? Warmth. ? Pus or a bad smell.  Protect the biopsy area. Do not let the area get bumped. Activity  If you had an incision during your procedure,avoid activities that may pull the incision site open. Avoid stretching, reaching, exercise, sports, or lifting anything that is heavier than 3 lb (1.4 kg).  Return to your normal activities as told by your health care provider. Ask your health care provider what activities are safe for you. General instructions  Resume your usual diet.  Wear a good support bra for as long as told by your health care provider.  Get checked for extra fluid around your lymph nodes (lymphedema) as often as told by your health care provider.  Keep all follow-up visits as told by your health care provider. This is important. Contact a health care provider if:  You have more redness, swelling, or pain at the biopsy site.  You have more fluid or blood coming from  your biopsy site.  Your biopsy site feels warm to the touch.  You have pus or a bad smell coming from the biopsy site.  Your biopsy site breaks open after the sutures, staples, or skin adhesive strips have been removed.  You have a rash.  You have a fever. Get help right away if:  You have increased bleeding (more than a small spot) from the biopsy site.  You have difficulty breathing.  You have red streaks around the biopsy site. This information is not intended to replace advice given to you by your health care provider. Make sure you discuss any questions you have with your health care provider. Document Released: 03/26/2005 Document Revised: 05/13/2016 Document Reviewed: 06/10/2015 Elsevier Interactive Patient Education  2018 [place].  Breast Biopsy, Care After These instructions give you information about caring for yourself after your procedure. Your doctor may also give you more specific instructions. Call your doctor if you have any problems or questions after your procedure. Follow these instructions at home: Medicines  Take over-the-counter and prescription medicines only as told by your doctor.  Do not drive for 24 hours if you received a sedative.  Do not drink alcohol while taking pain medicine.  Do not drive or use heavy machinery while taking prescription pain medicine. Biopsy Site Care   Follow instructions from your doctor about how to take care of your cut from surgery (incision) or puncture area. Make sure you: ? Wash your hands with soap and water before you change your bandage. If you cannot use soap and water, use hand sanitizer. ? Change any bandages (dressings) as told by your doctor. ? Leave any stitches (sutures), skin glue, or skin tape (adhesive) strips in place. They may need to stay in place for 2 weeks or longer. If tape strips get loose and curl up, you may trim the loose edges. Do not remove tape strips completely unless your doctor says  it is okay.  If you have stitches, keep them dry when you take a bath or a shower.  Check your cut or puncture area every day for signs of infection. Check for: ? More redness, swelling, or pain. ? More fluid or blood. ? Warmth. ? Pus or a bad smell.  Protect the biopsy area. Do not let the area get bumped. Activity  Avoid activities that could pull the biopsy site open. ? Avoid stretching. ? Avoid reaching. ? Avoid exercise. ? Avoid sports. ? Avoid lifting anything that is heavier than 3 pounds (1.4 kg).  Return to your normal activities as told by your doctor. Ask your doctor what activities are safe for you. General instructions  Continue your normal diet.  Wear a good support bra for as long as told by your doctor.  Get checked for extra fluid in your body (lymphedema) as often as told by your doctor.  Keep all follow-up visits as told by your doctor. This is important. Contact a health care provider if:  You have more redness, swelling, or pain at the biopsy site.  You have more fluid or blood coming from your biopsy site.  Your biopsy site feels warm to the touch.  You have pus or a bad smell coming from the biopsy site.  Your biopsy site breaks open after the stitches, staples, or skin tape strips have been removed.  You have a rash.  You have a fever. Get help right away if:  You have more bleeding (more than a small spot) from the biopsy site.  You have trouble breathing.  You have red streaks around the biopsy site. This information is not intended to replace advice given to you by your health care provider. Make sure you discuss any questions you have with your health care provider. Document Released: 07/03/2009 Document Revised: 05/13/2016 Document Reviewed: 06/10/2015 Elsevier Interactive Patient Education  2018 [place] Anesthesia, Adult General anesthesia is the use of medicines to make a person "go to sleep" (be unconscious) for  a medical procedure. General anesthesia is often recommended when a procedure:  Is long.  Requires you to be still or in an unusual position.  Is major and can cause you to lose blood.  Is impossible to do without general anesthesia.  The medicines used for general anesthesia are called general anesthetics. In addition to making you sleep, the medicines:  Prevent pain.  Control your blood pressure.  Relax your muscles.  Tell a health care provider about:  Any allergies you have.  All medicines you are taking, including vitamins, herbs, eye drops, creams, and over-the-counter medicines.  Any problems you or family members have had with anesthetic medicines.  Types of anesthetics you have had in the past.  Any bleeding disorders you have.  Any surgeries you have had.  Any medical conditions you have.  Any history of heart or lung conditions, such as heart failure, sleep apnea, or chronic obstructive pulmonary disease (COPD).  Whether you are pregnant or may be pregnant.  Whether you use tobacco, alcohol, marijuana, or street drugs.  Any history of Armed forces logistics/support/administrative officer.  Any history of depression or anxiety. What are the risks? Generally, this is a safe procedure. However, problems may occur, including:  Allergic reaction to anesthetics.  Lung and heart problems.  Inhaling food or liquids from your stomach into your lungs (aspiration).  Injury to nerves.  Waking up during your procedure and being unable to move (rare).  Extreme agitation or a state of mental confusion (delirium) when you wake up from the anesthetic.  Air in the bloodstream, which can lead to stroke.  These problems are more likely to develop if you are having a major surgery or if you have an advanced medical condition. You can prevent some of these complications by answering all of your health care provider's questions thoroughly and by following  all pre-procedure instructions. General  anesthesia can cause side effects, including:  Nausea or vomiting  A sore throat from the breathing tube.  Feeling cold or shivery.  Feeling tired, washed out, or achy.  Sleepiness or drowsiness.  Confusion or agitation.  What happens before the procedure? Staying hydrated Follow instructions from your health care provider about hydration, which may include:  Up to 2 hours before the procedure - you may continue to drink clear liquids, such as water, clear fruit juice, black coffee, and plain tea.  Eating and drinking restrictions Follow instructions from your health care provider about eating and drinking, which may include:  8 hours before the procedure - stop eating heavy meals or foods such as meat, fried foods, or fatty foods.  6 hours before the procedure - stop eating light meals or foods, such as toast or cereal.  6 hours before the procedure - stop drinking milk or drinks that contain milk.  2 hours before the procedure - stop drinking clear liquids.  Medicines  Ask your health care provider about: ? Changing or stopping your regular medicines. This is especially important if you are taking diabetes medicines or blood thinners. ? Taking medicines such as aspirin and ibuprofen. These medicines can thin your blood. Do not take these medicines before your procedure if your health care provider instructs you not to. ? Taking new dietary supplements or medicines. Do not take these during the week before your procedure unless your health care provider approves them.  If you are told to take a medicine or to continue taking a medicine on the day of the procedure, take the medicine with sips of water. General instructions   Ask if you will be going home the same day, the following day, or after a longer hospital stay. ? Plan to have someone take you home. ? Plan to have someone stay with you for the first 24 hours after you leave the hospital or clinic.  For 3-6 weeks  before the procedure, try not to use any tobacco products, such as cigarettes, chewing tobacco, and e-cigarettes.  You may brush your teeth on the morning of the procedure, but make sure to spit out the toothpaste. What happens during the procedure?  You will be given anesthetics through a mask and through an IV tube in one of your veins.  You may receive medicine to help you relax (sedative).  As soon as you are asleep, a breathing tube may be used to help you breathe.  An anesthesia specialist will stay with you throughout the procedure. He or she will help keep you comfortable and safe by continuing to give you medicines and adjusting the amount of medicine that you get. He or she will also watch your blood pressure, pulse, and oxygen levels to make sure that the anesthetics do not cause any problems.  If a breathing tube was used to help you breathe, it will be removed before you wake up. The procedure may vary among health care providers and hospitals. What happens after the procedure?  You will wake up, often slowly, after the procedure is complete, usually in a recovery area.  Your blood pressure, heart rate, breathing rate, and blood oxygen level will be monitored until the medicines you were given have worn off.  You may be given medicine to help you calm down if you feel anxious or agitated.  If you will be going home the same day, your health care provider may check to make  sure you can stand, drink, and urinate.  Your health care providers will treat your pain and side effects before you go home.  Do not drive for 24 hours if you received a sedative.  You may: ? Feel nauseous and vomit. ? Have a sore throat. ? Have mental slowness. ? Feel cold or shivery. ? Feel sleepy. ? Feel tired. ? Feel sore or achy, even in parts of your body where you did not have surgery. This information is not intended to replace advice given to you by your health care provider. Make sure you  discuss any questions you have with your health care provider. Document Released: 12/14/2007 Document Revised: 02/17/2016 Document Reviewed: 08/21/2015 Elsevier Interactive Patient Education  2018 [place] Anesthesia, Adult, Care After These instructions provide you with information about caring for yourself after your procedure. Your health care provider may also give you more specific instructions. Your treatment has been planned according to current medical practices, but problems sometimes occur. Call your health care provider if you have any problems or questions after your procedure. What can I expect after the procedure? After the procedure, it is common to have:  Vomiting.  A sore throat.  Mental slowness.  It is common to feel:  Nauseous.  Cold or shivery.  Sleepy.  Tired.  Sore or achy, even in parts of your body where you did not have surgery.  Follow these instructions at home: For at least 24 hours after the procedure:  Do not: ? Participate in activities where you could fall or become injured. ? Drive. ? Use heavy machinery. ? Drink alcohol. ? Take sleeping pills or medicines that cause drowsiness. ? Make important decisions or sign legal documents. ? Take care of children on your own.  Rest. Eating and drinking  If you vomit, drink water, juice, or soup when you can drink without vomiting.  Drink enough fluid to keep your urine clear or pale yellow.  Make sure you have little or no nausea before eating solid foods.  Follow the diet recommended by your health care provider. General instructions  Have a responsible adult stay with you until you are awake and alert.  Return to your normal activities as told by your health care provider. Ask your health care provider what activities are safe for you.  Take over-the-counter and prescription medicines only as told by your health care provider.  If you smoke, do not smoke without  supervision.  Keep all follow-up visits as told by your health care provider. This is important. Contact a health care provider if:  You continue to have nausea or vomiting at home, and medicines are not helpful.  You cannot drink fluids or start eating again.  You cannot urinate after 8-12 hours.  You develop a skin rash.  You have fever.  You have increasing redness at the site of your procedure. Get help right away if:  You have difficulty breathing.  You have chest pain.  You have unexpected bleeding.  You feel that you are having a life-threatening or urgent problem. This information is not intended to replace advice given to you by your health care provider. Make sure you discuss any questions you have with your health care provider. Document Released: 12/13/2000 Document Revised: 02/09/2016 Document Reviewed: 08/21/2015 Elsevier Interactive Patient Education  [employer].

## 2017-04-29 ENCOUNTER — Encounter (HOSPITAL_COMMUNITY): Payer: Self-pay

## 2017-04-29 ENCOUNTER — Encounter (HOSPITAL_COMMUNITY)
Admission: RE | Admit: 2017-04-29 | Discharge: 2017-04-29 | Disposition: A | Discharge status: Home / Self Care | Payer: Medicare Other | Source: Ambulatory Visit | Attending: General Surgery | Admitting: General Surgery

## 2017-04-29 ENCOUNTER — Ambulatory Visit (HOSPITAL_COMMUNITY)
Admission: RE | Admit: 2017-04-29 | Discharge: 2017-04-29 | Disposition: A | Discharge status: Home / Self Care | Payer: Medicare Other | Source: Ambulatory Visit | Attending: General Surgery | Admitting: General Surgery

## 2017-04-29 DIAGNOSIS — N6320 Unspecified lump in the left breast, unspecified quadrant: Secondary | ICD-10-CM

## 2017-04-29 DIAGNOSIS — Z01818 Encounter for other preprocedural examination: Secondary | ICD-10-CM | POA: Diagnosis not present

## 2017-04-29 DIAGNOSIS — I700 Atherosclerosis of aorta: Secondary | ICD-10-CM | POA: Insufficient documentation

## 2017-04-29 LAB — CBC WITH DIFFERENTIAL/PLATELET
BASOS ABS: 0 10*3/uL (ref 0.0–0.1)
BASOS PCT: 0 %
EOS ABS: 0.2 10*3/uL (ref 0.0–0.7)
Eosinophils Relative: 3 %
HCT: 39.9 % (ref 36.0–46.0)
HEMOGLOBIN: 13.4 g/dL (ref 12.0–15.0)
Lymphocytes Relative: 31 %
Lymphs Abs: 2.2 10*3/uL (ref 0.7–4.0)
MCH: 30.1 pg (ref 26.0–34.0)
MCHC: 33.6 g/dL (ref 30.0–36.0)
MCV: 89.7 fL (ref 78.0–100.0)
MONOS PCT: 9 %
Monocytes Absolute: 0.6 10*3/uL (ref 0.1–1.0)
NEUTROS PCT: 57 %
Neutro Abs: 4.2 10*3/uL (ref 1.7–7.7)
Platelets: 253 10*3/uL (ref 150–400)
RBC: 4.45 MIL/uL (ref 3.87–5.11)
RDW: 12.5 % (ref 11.5–15.5)
WBC: 7.3 10*3/uL (ref 4.0–10.5)

## 2017-04-29 LAB — COMPREHENSIVE METABOLIC PANEL
ALBUMIN: 4.3 g/dL (ref 3.5–5.0)
ALT: 20 U/L (ref 14–54)
ANION GAP: 8 (ref 5–15)
AST: 23 U/L (ref 15–41)
Alkaline Phosphatase: 86 U/L (ref 38–126)
BUN: 17 mg/dL (ref 6–20)
CALCIUM: 9.9 mg/dL (ref 8.9–10.3)
CO2: 27 mmol/L (ref 22–32)
Chloride: 101 mmol/L (ref 101–111)
Creatinine, Ser: 0.86 mg/dL (ref 0.44–1.00)
GFR calc Af Amer: >60 mL/min (ref >60)
GFR calc non Af Amer: >60 mL/min (ref >60)
GLUCOSE: 189 mg/dL — AB (ref 65–99)
Potassium: 4 mmol/L (ref 3.5–5.1)
SODIUM: 136 mmol/L (ref 135–145)
Total Bilirubin: 0.9 mg/dL (ref 0.3–1.2)
Total Protein: 7.4 g/dL (ref 6.5–8.1)

## 2017-05-04 ENCOUNTER — Ambulatory Visit (HOSPITAL_COMMUNITY): Payer: Medicare Other | Admitting: Anesthesiology

## 2017-05-04 ENCOUNTER — Ambulatory Visit (HOSPITAL_COMMUNITY)
Admission: RE | Admit: 2017-05-04 | Discharge: 2017-05-04 | Disposition: A | Discharge status: Home / Self Care | Payer: Medicare Other | Source: Ambulatory Visit | Attending: General Surgery | Admitting: General Surgery

## 2017-05-04 ENCOUNTER — Other Ambulatory Visit: Payer: Self-pay | Admitting: General Surgery

## 2017-05-04 ENCOUNTER — Encounter (HOSPITAL_COMMUNITY)
Admission: RE | Disposition: A | Discharge status: Home / Self Care | Payer: Self-pay | Source: Ambulatory Visit | Attending: General Surgery

## 2017-05-04 ENCOUNTER — Encounter (HOSPITAL_COMMUNITY): Payer: Self-pay | Admitting: *Deleted

## 2017-05-04 DIAGNOSIS — Z79899 Other long term (current) drug therapy: Secondary | ICD-10-CM | POA: Diagnosis not present

## 2017-05-04 DIAGNOSIS — I739 Peripheral vascular disease, unspecified: Secondary | ICD-10-CM | POA: Insufficient documentation

## 2017-05-04 DIAGNOSIS — I4891 Unspecified atrial fibrillation: Secondary | ICD-10-CM | POA: Insufficient documentation

## 2017-05-04 DIAGNOSIS — N6489 Other specified disorders of breast: Secondary | ICD-10-CM | POA: Diagnosis not present

## 2017-05-04 DIAGNOSIS — N6320 Unspecified lump in the left breast, unspecified quadrant: Secondary | ICD-10-CM

## 2017-05-04 DIAGNOSIS — R928 Other abnormal and inconclusive findings on diagnostic imaging of breast: Secondary | ICD-10-CM

## 2017-05-04 DIAGNOSIS — M1990 Unspecified osteoarthritis, unspecified site: Secondary | ICD-10-CM | POA: Diagnosis not present

## 2017-05-04 DIAGNOSIS — F17210 Nicotine dependence, cigarettes, uncomplicated: Secondary | ICD-10-CM | POA: Insufficient documentation

## 2017-05-04 DIAGNOSIS — I10 Essential (primary) hypertension: Secondary | ICD-10-CM | POA: Diagnosis not present

## 2017-05-04 DIAGNOSIS — K219 Gastro-esophageal reflux disease without esophagitis: Secondary | ICD-10-CM | POA: Diagnosis not present

## 2017-05-04 DIAGNOSIS — J449 Chronic obstructive pulmonary disease, unspecified: Secondary | ICD-10-CM | POA: Insufficient documentation

## 2017-05-04 DIAGNOSIS — E039 Hypothyroidism, unspecified: Secondary | ICD-10-CM | POA: Insufficient documentation

## 2017-05-04 DIAGNOSIS — IMO0002 Reserved for concepts with insufficient information to code with codable children: Secondary | ICD-10-CM

## 2017-05-04 DIAGNOSIS — R229 Localized swelling, mass and lump, unspecified: Principal | ICD-10-CM

## 2017-05-04 DIAGNOSIS — E7800 Pure hypercholesterolemia, unspecified: Secondary | ICD-10-CM | POA: Diagnosis not present

## 2017-05-04 HISTORY — PX: BREAST BIOPSY: SHX20

## 2017-05-04 LAB — GLUCOSE, CAPILLARY
GLUCOSE-CAPILLARY: 159 mg/dL — AB (ref 65–99)
GLUCOSE-CAPILLARY: 123 mg/dL — AB (ref 65–99)

## 2017-05-04 SURGERY — BREAST BIOPSY WITH NEEDLE LOCALIZATION
Anesthesia: General | Laterality: Left

## 2017-05-04 MED ORDER — CHLORHEXIDINE GLUCONATE CLOTH 2 % EX PADS: 6.0000 | MEDICATED_PAD | Freq: Once | CUTANEOUS | Status: DC

## 2017-05-04 MED ORDER — PROPOFOL 10 MG/ML IV BOLUS
INTRAVENOUS | Status: DC | PRN
  Administered 2017-05-04: 120 mg via INTRAVENOUS

## 2017-05-04 MED ORDER — FENTANYL CITRATE (PF) 100 MCG/2ML IJ SOLN: 25.0000 ug | INTRAMUSCULAR | Status: DC | PRN

## 2017-05-04 MED ORDER — BUPIVACAINE HCL (PF) 0.5 % IJ SOLN
INTRAMUSCULAR | Status: AC
  Filled 2017-05-04: qty 30

## 2017-05-04 MED ORDER — KETOROLAC TROMETHAMINE 30 MG/ML IJ SOLN
30.0000 mg | Freq: Once | INTRAMUSCULAR | Status: AC
  Administered 2017-05-04: 30 mg via INTRAVENOUS
  Filled 2017-05-04: qty 1

## 2017-05-04 MED ORDER — FENTANYL CITRATE (PF) 250 MCG/5ML IJ SOLN
INTRAMUSCULAR | Status: AC
  Filled 2017-05-04: qty 5

## 2017-05-04 MED ORDER — LIDOCAINE HCL (PF) 1 % IJ SOLN
INTRAMUSCULAR | Status: AC
  Filled 2017-05-04: qty 5

## 2017-05-04 MED ORDER — LACTATED RINGERS IV SOLN
INTRAVENOUS | Status: DC
  Administered 2017-05-04 (×2): via INTRAVENOUS

## 2017-05-04 MED ORDER — CEFAZOLIN SODIUM-DEXTROSE 2-4 GM/100ML-% IV SOLN
2.0000 g | INTRAVENOUS | Status: AC
  Administered 2017-05-04: 2 g via INTRAVENOUS
  Filled 2017-05-04: qty 100

## 2017-05-04 MED ORDER — BUPIVACAINE HCL (PF) 0.5 % IJ SOLN
INTRAMUSCULAR | Status: DC | PRN
  Administered 2017-05-04: 10 mL

## 2017-05-04 MED ORDER — FENTANYL CITRATE (PF) 100 MCG/2ML IJ SOLN
INTRAMUSCULAR | Status: DC | PRN
  Administered 2017-05-04: 50 ug via INTRAVENOUS

## 2017-05-04 MED ORDER — MIDAZOLAM HCL 2 MG/2ML IJ SOLN
1.0000 mg | INTRAMUSCULAR | Status: AC
  Administered 2017-05-04: 2 mg via INTRAVENOUS
  Filled 2017-05-04: qty 2

## 2017-05-04 MED ORDER — LIDOCAINE HCL (PF) 2 % IJ SOLN
INTRAMUSCULAR | Status: AC
  Filled 2017-05-04: qty 10

## 2017-05-04 MED ORDER — ARTIFICIAL TEARS OPHTHALMIC OINT
TOPICAL_OINTMENT | OPHTHALMIC | Status: AC
  Filled 2017-05-04: qty 7

## 2017-05-04 MED ORDER — MIDAZOLAM HCL 2 MG/2ML IJ SOLN
INTRAMUSCULAR | Status: AC
  Filled 2017-05-04: qty 2

## 2017-05-04 MED ORDER — PROPOFOL 10 MG/ML IV BOLUS
INTRAVENOUS | Status: AC
  Filled 2017-05-04: qty 20

## 2017-05-04 MED ORDER — LIDOCAINE HCL (CARDIAC) 20 MG/ML IV SOLN
INTRAVENOUS | Status: DC | PRN
  Administered 2017-05-04: 30 mg via INTRAVENOUS

## 2017-05-04 SURGICAL SUPPLY — 32 items
ADH SKN CLS APL DERMABOND .7 (GAUZE/BANDAGES/DRESSINGS) ×1
BAG HAMPER (MISCELLANEOUS) ×3 IMPLANT
BLADE SURG 15 STRL LF DISP TIS (BLADE) ×1 IMPLANT
BLADE SURG 15 STRL SS (BLADE) ×3
CHLORAPREP W/TINT 26ML (MISCELLANEOUS) ×3 IMPLANT
CLOTH BEACON ORANGE TIMEOUT ST (SAFETY) ×3 IMPLANT
COVER LIGHT HANDLE STERIS (MISCELLANEOUS) ×6 IMPLANT
DECANTER SPIKE VIAL GLASS SM (MISCELLANEOUS) ×3 IMPLANT
DERMABOND ADVANCED (GAUZE/BANDAGES/DRESSINGS) ×2
DERMABOND ADVANCED .7 DNX12 (GAUZE/BANDAGES/DRESSINGS) IMPLANT
ELECT REM PT RETURN 9FT ADLT (ELECTROSURGICAL) ×3
ELECTRODE REM PT RTRN 9FT ADLT (ELECTROSURGICAL) ×1 IMPLANT
FORMALIN 10 PREFIL 120ML (MISCELLANEOUS) ×3 IMPLANT
GLOVE BIOGEL PI IND STRL 7.0 (GLOVE) ×1 IMPLANT
GLOVE BIOGEL PI INDICATOR 7.0 (GLOVE) ×2
GLOVE SURG SS PI 7.5 STRL IVOR (GLOVE) ×6 IMPLANT
GOWN STRL REUS W/TWL LRG LVL3 (GOWN DISPOSABLE) ×9 IMPLANT
KIT ROOM TURNOVER APOR (KITS) ×3 IMPLANT
MANIFOLD NEPTUNE II (INSTRUMENTS) ×3 IMPLANT
NDL HYPO 18GX1.5 BLUNT FILL (NEEDLE) ×1 IMPLANT
NDL HYPO 25X1 1.5 SAFETY (NEEDLE) ×1 IMPLANT
NEEDLE HYPO 18GX1.5 BLUNT FILL (NEEDLE) ×3 IMPLANT
NEEDLE HYPO 25X1 1.5 SAFETY (NEEDLE) ×3 IMPLANT
NS IRRIG 1000ML POUR BTL (IV SOLUTION) ×3 IMPLANT
PACK MINOR (CUSTOM PROCEDURE TRAY) ×3 IMPLANT
PAD ARMBOARD 7.5X6 YLW CONV (MISCELLANEOUS) ×3 IMPLANT
SET BASIN LINEN APH (SET/KITS/TRAYS/PACK) ×3 IMPLANT
SUT SILK 2 0 SH (SUTURE) ×3 IMPLANT
SUT VIC AB 3-0 SH 27 (SUTURE) ×3
SUT VIC AB 3-0 SH 27X BRD (SUTURE) ×1 IMPLANT
SUT VIC AB 4-0 PS2 27 (SUTURE) ×3 IMPLANT
SYR CONTROL 10ML LL (SYRINGE) ×3 IMPLANT

## 2017-05-04 NOTE — Anesthesia Postprocedure Evaluation (Signed)
Anesthesia Post Note  Patient: [name]  Procedure(s) Performed: Procedure(s) (LRB): BREAST BIOPSY WITH NEEDLE LOCALIZATION (Left)  Patient location during evaluation: PACU Anesthesia Type: General Level of consciousness: awake and alert, oriented and patient cooperative Pain management: pain level controlled Vital Signs Assessment: post-procedure vital signs reviewed and stable Respiratory status: spontaneous breathing Cardiovascular status: stable Postop Assessment: no signs of nausea or vomiting Anesthetic complications: no     Last Vitals:  Vitals:   05/04/17 1200 05/04/17 1215  BP: (!) 133/54 (!) 155/60  Pulse: (!) 57 (!) 52  Resp: 18 16  Temp:    SpO2: 100% 100%    Last Pain:  Vitals:   05/04/17 0900  TempSrc: Oral                 ADAMS, AMY A

## 2017-05-04 NOTE — Interval H&P Note (Signed)
History and Physical Interval Note:  05/04/2017 10:04 AM  [name]  has presented today for surgery, with the diagnosis of left breast mass  The various methods of treatment have been discussed with the patient and family. After consideration of risks, benefits and other options for treatment, the patient has consented to  Procedure(s) with comments: BREAST BIOPSY WITH NEEDLE LOCALIZATION (Left) - needle loc at 8:00 as a surgical intervention .  The patient's history has been reviewed, patient examined, no change in status, stable for surgery.  I have reviewed the patient's chart and labs.  Questions were answered to the patient's satisfaction.     Aviva Signs

## 2017-05-04 NOTE — Op Note (Signed)
Patient:  [name]  DOB:  [birth year]  MRN:  [id number]   Preop Diagnosis:  Left breast lesion  Postop Diagnosis:  Same  Procedure:  Left breast biopsy after needle localization  Surgeon:  Aviva Signs, M.D.  Anes:  Gen.  Indications:  Patient is a 76 year old white female was found to have a suspicious lesion on screening mammography in the left breast. A core biopsy was done, but it is recommended that the patient have an excisional biopsy. The risks and benefits of the procedure were fully explained to the patient, who gave informed consent.  Procedure note:  The patient was placed in the supine position. She had already undergone needle localization in the radiology department. The needle was located at the 12:00 position in the left breast. After general anesthesia was administered, the left breast was prepped and draped using usual sterile technique with DuraPrep. Surgical site confirmation was performed.  A curvilinear incision was made to include the needle. The needle was then followed down to the area of concern. This was removed without difficulty. Cystic-like structures were found during the dissection. The specimen was sent to radiology for an x-ray. Specimen radiography revealed the suspicious area and clip to be within the specimen removed. It was then sent to pathology for further examination. A bleeding was controlled using Bovie electrocautery. The skin was closed using a 4-0 Vicryl subcuticular suture. 0.5% Sensorcaine was instilled into the surrounding wound. Dermabond was applied.  All tape and needle counts were correct at the end of the procedure. The patient was awakened and transferred to PACU in stable condition.  Complications:  None  EBL:  Minimal  Specimen:  Left breast tissue

## 2017-05-04 NOTE — Transfer of Care (Signed)
Immediate Anesthesia Transfer of Care Note  Patient: [name]  Procedure(s) Performed: Procedure(s) with comments: BREAST BIOPSY WITH NEEDLE LOCALIZATION (Left) - needle loc at 8:00  Patient Location: PACU  Anesthesia Type:General  Level of Consciousness: awake, oriented and patient cooperative  Airway & Oxygen Therapy: Patient Spontanous Breathing and Patient connected to nasal cannula oxygen  Post-op Assessment: Report given to RN and Post -op Vital signs reviewed and stable  Post vital signs: Reviewed and stable  Last Vitals:  Vitals:   05/04/17 1020 05/04/17 1035  BP: (!) 119/47 (!) 125/47  Pulse:    Resp: 18 18  Temp:    SpO2: 99% 99%    Last Pain:  Vitals:   05/04/17 0900  TempSrc: Oral      Patients Stated Pain Goal: 7 (20/94/70 9628)  Complications: No apparent anesthesia complications

## 2017-05-04 NOTE — Progress Notes (Signed)
Patient taken to Mammogram via wheelchair.

## 2017-05-04 NOTE — Anesthesia Procedure Notes (Signed)
Procedure Name: LMA Insertion Date/Time: 05/04/2017 11:51 AM Performed by: Andree Elk, Haleema Vanderheyden A Pre-anesthesia Checklist: Patient identified, Timeout performed, Emergency Drugs available, Suction available and Patient being monitored Patient Re-evaluated:Patient Re-evaluated prior to induction Oxygen Delivery Method: Circle system utilized Preoxygenation: Pre-oxygenation with 100% oxygen Induction Type: IV induction Ventilation: Mask ventilation without difficulty LMA: LMA inserted LMA Size: 4.0 Number of attempts: 1 Placement Confirmation: positive ETCO2 and breath sounds checked- equal and bilateral Tube secured with: Tape Dental Injury: Teeth and Oropharynx as per pre-operative assessment

## 2017-05-04 NOTE — Anesthesia Preprocedure Evaluation (Signed)
Anesthesia Evaluation  Patient identified by MRN, date of birth, ID band Patient awake    Reviewed: Allergy & Precautions, H&P , NPO status , Patient's Chart, lab work & pertinent test results, reviewed documented beta blocker date and time   Airway Mallampati: II  TM Distance: >3 FB     Dental  (+) Teeth Intact, Partial Lower   Pulmonary shortness of breath and with exertion, COPD, Current Smoker,    breath sounds clear to auscultation       Cardiovascular hypertension, Pt. on medications + Peripheral Vascular Disease  + dysrhythmias (palpitations, beta blockers)  Rhythm:Regular Rate:Normal     Neuro/Psych    GI/Hepatic GERD  Medicated and Controlled,  Endo/Other  Hypothyroidism   Renal/GU      Musculoskeletal   Abdominal   Peds  Hematology   Anesthesia Other Findings   Reproductive/Obstetrics                             Anesthesia Physical Anesthesia Plan  ASA: III  Anesthesia Plan: General   Post-op Pain Management:    Induction: Intravenous  PONV Risk Score and Plan:   Airway Management Planned: LMA  Additional Equipment:   Intra-op Plan:   Post-operative Plan: Extubation in OR  Informed Consent: I have reviewed the patients History and Physical, chart, labs and discussed the procedure including the risks, benefits and alternatives for the proposed anesthesia with the patient or authorized representative who has indicated his/her understanding and acceptance.     Plan Discussed with:   Anesthesia Plan Comments:         Anesthesia Quick Evaluation

## 2017-05-04 NOTE — Progress Notes (Signed)
Patient returned from Mammogram at this time.

## 2017-05-04 NOTE — Discharge Instructions (Signed)
Breast Biopsy, Care After °Refer to this sheet in the next few weeks. These instructions provide you with information about caring for yourself after your procedure. Your health care provider may also give you more specific instructions. Your treatment has been planned according to current medical practices, but problems sometimes occur. Call your health care provider if you have any problems or questions after your procedure. °What can I expect after the procedure? °After your procedure, it is common to have: °· Bruising on your breast. °· Numbness, tingling, or pain near your biopsy site. ° °Follow these instructions at home: °Medicines °· Take over-the-counter and prescription medicines only as told by your health care provider. °· Do not drive for 24 hours if you received a sedative. °· Do not drink alcohol while taking pain medicine. °· Do not drive or operate heavy machinery while taking prescription pain medicine. °Biopsy Site Care ° °· Follow instructions from your health care provider about how to take care of your incision or puncture site. Make sure you: °? Wash your hands with soap and water before you change your dressing. If soap and water are not available, use hand sanitizer. °? Change any bandages (dressings) as told by your health care provider. °? Leave any stitches (sutures), skin glue, or adhesive strips in place. These skin closures may need to stay in place for 2 weeks or longer. If adhesive strip edges start to loosen and curl up, you may trim the loose edges. Do not remove adhesive strips completely unless your health care provider tells you to do that. °· If you have sutures, keep them dry when bathing. °· Check your incision or puncture area every day for signs of infection. Check for: °? More redness, swelling, or pain. °? More fluid or blood. °? Warmth. °? Pus or a bad smell. °· Protect the biopsy area. Do not let the area get bumped. °Activity °· If you had an incision during your  procedure, avoid activities that may pull the incision site open. Avoid stretching, reaching, exercise, sports, or lifting anything that is heavier than 3 lb (1.4 kg). °· Return to your normal activities as told by your health care provider. Ask your health care provider what activities are safe for you. °General instructions °· Resume your usual diet. °· Wear a good support bra for as long as told by your health care provider. °· Get checked for extra fluid around your lymph nodes (lymphedema) as often as told by your health care provider. °· Keep all follow-up visits as told by your health care provider. This is important. °Contact a health care provider if: °· You have more redness, swelling, or pain at the biopsy site. °· You have more fluid or blood coming from your biopsy site. °· Your biopsy site feels warm to the touch. °· You have pus or a bad smell coming from the biopsy site. °· Your biopsy site breaks open after the sutures, staples, or skin adhesive strips have been removed. °· You have a rash. °· You have a fever. °Get help right away if: °· You have increased bleeding (more than a small spot) from the biopsy site. °· You have difficulty breathing. °· You have red streaks around the biopsy site. °This information is not intended to replace advice given to you by your health care provider. Make sure you discuss any questions you have with your health care provider. °Document Released: 03/26/2005 Document Revised: 05/13/2016 Document Reviewed: 06/10/2015 °Elsevier Interactive Patient Education © 2018 [employer] ° °

## 2017-05-05 ENCOUNTER — Encounter (HOSPITAL_COMMUNITY): Payer: Self-pay | Admitting: General Surgery

## 2017-05-06 ENCOUNTER — Ambulatory Visit (HOSPITAL_COMMUNITY)
Admission: RE | Admit: 2017-05-06 | Discharge: 2017-05-06 | Disposition: A | Discharge status: Home / Self Care | Payer: Medicare Other | Source: Ambulatory Visit | Attending: Cardiovascular Disease | Admitting: Cardiovascular Disease

## 2017-05-06 DIAGNOSIS — I779 Disorder of arteries and arterioles, unspecified: Secondary | ICD-10-CM

## 2017-05-06 DIAGNOSIS — I6523 Occlusion and stenosis of bilateral carotid arteries: Secondary | ICD-10-CM | POA: Insufficient documentation

## 2017-05-06 DIAGNOSIS — I739 Peripheral vascular disease, unspecified: Secondary | ICD-10-CM

## 2017-05-10 ENCOUNTER — Encounter: Payer: Self-pay | Admitting: General Surgery

## 2017-05-10 ENCOUNTER — Ambulatory Visit (INDEPENDENT_AMBULATORY_CARE_PROVIDER_SITE_OTHER): Payer: Self-pay | Admitting: General Surgery

## 2017-05-10 VITALS — BP 166/63 | HR 76 | Temp 97.8°F | Resp 18 | Ht 65.0 in | Wt 164.0 lb

## 2017-05-10 DIAGNOSIS — Z09 Encounter for follow-up examination after completed treatment for conditions other than malignant neoplasm: Secondary | ICD-10-CM

## 2017-05-10 NOTE — Progress Notes (Signed)
Subjective:     [name]  Status post left breast biopsy after needle localization. Patient doing well. Having no pain. Objective:    BP (!) 166/63   Pulse 76   Temp 97.8 F (36.6 C)   Resp 18   Ht 5\' 5"  (1.651 m)   Wt 164 lb (74.4 kg)   BMI 27.29 kg/m   General:  alert, cooperative and no distress  Left breast incision healing well. Final pathology negative for malignancy.     Assessment:    Doing well postoperatively.    Plan:   Follow up here as needed. Continue annual mammograms through primary care physician.

## 2017-05-11 ENCOUNTER — Other Ambulatory Visit: Payer: Self-pay | Admitting: Cardiovascular Disease

## 2017-05-11 DIAGNOSIS — I779 Disorder of arteries and arterioles, unspecified: Secondary | ICD-10-CM

## 2017-05-11 DIAGNOSIS — I739 Peripheral vascular disease, unspecified: Principal | ICD-10-CM

## 2017-08-08 ENCOUNTER — Other Ambulatory Visit (HOSPITAL_COMMUNITY): Payer: Self-pay | Admitting: Family Medicine

## 2017-08-08 ENCOUNTER — Ambulatory Visit (HOSPITAL_COMMUNITY)
Admission: RE | Admit: 2017-08-08 | Discharge: 2017-08-08 | Disposition: A | Discharge status: Home / Self Care | Payer: Medicare Other | Source: Ambulatory Visit | Attending: Family Medicine | Admitting: Family Medicine

## 2017-08-08 DIAGNOSIS — M1288 Other specific arthropathies, not elsewhere classified, other specified site: Secondary | ICD-10-CM | POA: Insufficient documentation

## 2017-08-08 DIAGNOSIS — M5136 Other intervertebral disc degeneration, lumbar region: Secondary | ICD-10-CM | POA: Diagnosis not present

## 2017-08-08 DIAGNOSIS — R52 Pain, unspecified: Secondary | ICD-10-CM

## 2017-08-08 DIAGNOSIS — M25551 Pain in right hip: Secondary | ICD-10-CM | POA: Insufficient documentation

## 2017-08-08 DIAGNOSIS — M48061 Spinal stenosis, lumbar region without neurogenic claudication: Secondary | ICD-10-CM | POA: Diagnosis not present

## 2017-08-23 ENCOUNTER — Other Ambulatory Visit (HOSPITAL_COMMUNITY): Payer: Self-pay | Admitting: Family Medicine

## 2017-08-23 DIAGNOSIS — M25551 Pain in right hip: Secondary | ICD-10-CM

## 2017-08-23 DIAGNOSIS — G8929 Other chronic pain: Secondary | ICD-10-CM

## 2017-08-23 DIAGNOSIS — M545 Low back pain: Secondary | ICD-10-CM

## 2017-08-30 ENCOUNTER — Ambulatory Visit (HOSPITAL_COMMUNITY)
Admission: RE | Admit: 2017-08-30 | Discharge: 2017-08-30 | Disposition: A | Discharge status: Home / Self Care | Payer: Medicare Other | Source: Ambulatory Visit | Attending: Family Medicine | Admitting: Family Medicine

## 2017-08-30 DIAGNOSIS — M5186 Other intervertebral disc disorders, lumbar region: Secondary | ICD-10-CM | POA: Insufficient documentation

## 2017-08-30 DIAGNOSIS — M5127 Other intervertebral disc displacement, lumbosacral region: Secondary | ICD-10-CM | POA: Diagnosis not present

## 2017-08-30 DIAGNOSIS — M4696 Unspecified inflammatory spondylopathy, lumbar region: Secondary | ICD-10-CM | POA: Insufficient documentation

## 2017-08-30 DIAGNOSIS — M5126 Other intervertebral disc displacement, lumbar region: Secondary | ICD-10-CM | POA: Diagnosis not present

## 2017-08-30 DIAGNOSIS — M25551 Pain in right hip: Secondary | ICD-10-CM | POA: Diagnosis present

## 2017-08-30 DIAGNOSIS — M545 Low back pain: Secondary | ICD-10-CM

## 2017-08-30 DIAGNOSIS — G8929 Other chronic pain: Secondary | ICD-10-CM

## 2017-09-06 ENCOUNTER — Other Ambulatory Visit: Payer: Self-pay | Admitting: Family Medicine

## 2017-09-06 DIAGNOSIS — M5431 Sciatica, right side: Secondary | ICD-10-CM

## 2017-09-20 HISTORY — PX: BACK SURGERY: SHX140

## 2017-09-21 ENCOUNTER — Ambulatory Visit
Admission: RE | Admit: 2017-09-21 | Discharge: 2017-09-21 | Disposition: A | Discharge status: Home / Self Care | Payer: Medicare Other | Source: Ambulatory Visit | Attending: Family Medicine | Admitting: Family Medicine

## 2017-09-21 DIAGNOSIS — M5431 Sciatica, right side: Secondary | ICD-10-CM

## 2017-09-21 MED ORDER — IOPAMIDOL (ISOVUE-M 200) INJECTION 41%
1.0000 mL | Freq: Once | INTRAMUSCULAR | Status: AC
  Administered 2017-09-21: 1 mL via EPIDURAL

## 2017-09-21 MED ORDER — METHYLPREDNISOLONE ACETATE 40 MG/ML INJ SUSP (RADIOLOG
120.0000 mg | Freq: Once | INTRAMUSCULAR | Status: AC
  Administered 2017-09-21: 120 mg via EPIDURAL

## 2017-09-21 NOTE — Discharge Instructions (Signed)

## 2017-10-03 ENCOUNTER — Other Ambulatory Visit: Payer: Self-pay | Admitting: Family Medicine

## 2017-10-03 DIAGNOSIS — M5431 Sciatica, right side: Secondary | ICD-10-CM

## 2017-10-12 ENCOUNTER — Other Ambulatory Visit: Payer: Self-pay | Admitting: Family Medicine

## 2017-10-12 ENCOUNTER — Ambulatory Visit
Admission: RE | Admit: 2017-10-12 | Discharge: 2017-10-12 | Disposition: A | Discharge status: Home / Self Care | Payer: Medicare Other | Source: Ambulatory Visit | Attending: Family Medicine | Admitting: Family Medicine

## 2017-10-12 DIAGNOSIS — M5431 Sciatica, right side: Secondary | ICD-10-CM

## 2017-10-12 MED ORDER — IOPAMIDOL (ISOVUE-M 200) INJECTION 41%
1.0000 mL | Freq: Once | INTRAMUSCULAR | Status: AC
  Administered 2017-10-12: 1 mL via EPIDURAL

## 2017-10-12 MED ORDER — METHYLPREDNISOLONE ACETATE 40 MG/ML INJ SUSP (RADIOLOG
120.0000 mg | Freq: Once | INTRAMUSCULAR | Status: AC
  Administered 2017-10-12: 120 mg via EPIDURAL

## 2017-10-12 NOTE — Discharge Instructions (Signed)

## 2017-10-31 ENCOUNTER — Ambulatory Visit (HOSPITAL_COMMUNITY)
Admission: RE | Admit: 2017-10-31 | Discharge: 2017-10-31 | Disposition: A | Discharge status: Home / Self Care | Payer: Medicare Other | Source: Ambulatory Visit | Attending: Neurosurgery | Admitting: Neurosurgery

## 2017-10-31 ENCOUNTER — Other Ambulatory Visit (HOSPITAL_COMMUNITY): Payer: Self-pay | Admitting: Neurosurgery

## 2017-10-31 DIAGNOSIS — M4716 Other spondylosis with myelopathy, lumbar region: Secondary | ICD-10-CM

## 2017-11-01 ENCOUNTER — Other Ambulatory Visit (HOSPITAL_COMMUNITY): Payer: Self-pay | Admitting: Neurosurgery

## 2017-11-01 DIAGNOSIS — Z780 Asymptomatic menopausal state: Secondary | ICD-10-CM

## 2017-11-21 ENCOUNTER — Ambulatory Visit (HOSPITAL_COMMUNITY)
Admission: RE | Admit: 2017-11-21 | Discharge: 2017-11-21 | Disposition: A | Discharge status: Home / Self Care | Payer: Medicare Other | Source: Ambulatory Visit | Attending: Neurosurgery | Admitting: Neurosurgery

## 2017-11-21 DIAGNOSIS — Z780 Asymptomatic menopausal state: Secondary | ICD-10-CM | POA: Diagnosis present

## 2017-11-21 DIAGNOSIS — M8589 Other specified disorders of bone density and structure, multiple sites: Secondary | ICD-10-CM | POA: Insufficient documentation

## 2017-12-22 ENCOUNTER — Telehealth: Payer: Self-pay

## 2017-12-22 NOTE — Telephone Encounter (Signed)
   [facility] Pre-operative Risk Assessment    Request for surgical clearance:  1. What type of surgery is being performed? Decompressive laminotomy foraminotomy right L4/5   2. When is this surgery scheduled? 12/26/17   3. What type of clearance is required (medical clearance vs. Pharmacy clearance to hold med vs. Both)? Both  4. Are there any medications that need to be held prior to surgery and how long? ASA   5. Practice name and name of physician performing surgery? [facility]- Dr. Garnet Sierras  6. What is your office phone and fax number? Phone [phone] Fax [phone]   7. Anesthesia type (None, local, MAC, general) ? Unknow   Ewell Poe Ingalls 12/22/2017, 4:36 PM  _________________________________________________________________ EKG changes from Pre anesthesia

## 2017-12-23 ENCOUNTER — Ambulatory Visit (INDEPENDENT_AMBULATORY_CARE_PROVIDER_SITE_OTHER): Payer: Medicare Other | Admitting: Cardiovascular Disease

## 2017-12-23 ENCOUNTER — Encounter: Payer: Self-pay | Admitting: Cardiovascular Disease

## 2017-12-23 VITALS — BP 126/62 | HR 77 | Ht 65.0 in | Wt 158.6 lb

## 2017-12-23 DIAGNOSIS — I6522 Occlusion and stenosis of left carotid artery: Secondary | ICD-10-CM

## 2017-12-23 DIAGNOSIS — I10 Essential (primary) hypertension: Secondary | ICD-10-CM

## 2017-12-23 DIAGNOSIS — E7800 Pure hypercholesterolemia, unspecified: Secondary | ICD-10-CM | POA: Diagnosis not present

## 2017-12-23 NOTE — Assessment & Plan Note (Signed)
History of essential hypertension blood pressure measured at 126/62. She is on hydrochlorothiazide and Lotensin. Continue current meds at current dosing.

## 2017-12-23 NOTE — Telephone Encounter (Signed)
   Primary Cardiologist: Dr Gwenlyn Found, seen 12/23/2017  Chart reviewed as part of pre-operative protocol coverage. Given past medical history and time since last visit, based on ACC/AHA guidelines, [name] would be at acceptable risk for the planned procedure without further cardiovascular testing.   I will route this recommendation to the requesting party via Epic fax function and remove from pre-op pool.  Please call with questions.  Rosaria Ferries, PA-C 12/23/2017, 5:53 PM

## 2017-12-23 NOTE — Assessment & Plan Note (Signed)
History of carotid artery disease with Doppler performed 05/06/17 revealing moderate left ICA stenosis. This will be repeated on an annual basis.

## 2017-12-23 NOTE — Assessment & Plan Note (Signed)
History of hyperlipidemia on statin therapy followed by her PCP. 

## 2017-12-23 NOTE — Progress Notes (Signed)
12/23/2017 [name]   August 26, 1941  [id number]  Primary Physician Lemmie Evens, MD Primary Cardiologist: Lorretta Harp MD FACP, [place], [place], [place]  HPI:  [name] is a 77 y.o.   mildly-overweight married Caucasian female, mother of 2 and grandmother of 3 grandchildren, whom I last saw in the office 04/20/17. She has a history of moderate left internal carotid artery stenosis which we have been following by duplex ultrasound on an annual basis. She continues to smoke 10-15 cigarettes a day despite counseling to the contrary. Her other problems include hypertension and hyperlipidemia. She denies chest pain or shortness of breath. She had a Myoview performed November 27, 2008, which was nonischemic. Dr. Karie Kirks follows her lipid profile. Her last carotid Doppler study performed 05/06/17 revealed moderate left ICA stenosis.she was scheduled to have decompressive lumbar laminectomy by Dr. Carloyn Manner however an EKG showed PVCs and this was placed on hold until she got preoperative clearance. She is totally symptomatic denying chest pain or shortness of breath.      Current Meds  Medication Sig  . acyclovir (ZOVIRAX) 200 MG capsule Take 200 mg by mouth daily as needed (infection).   Marland Kitchen albuterol (PROVENTIL HFA;VENTOLIN HFA) 108 (90 BASE) MCG/ACT inhaler Inhale 2 puffs into the lungs every 6 (six) hours as needed for wheezing or shortness of breath.  . ALPRAZolam (XANAX) 0.5 MG tablet Take 0.25 tablets by mouth at bedtime as needed for sleep.   Marland Kitchen aspirin EC 81 MG tablet Take 81 mg by mouth 2 (two) times a week. Wednesday and sundays  . atorvastatin (LIPITOR) 40 MG tablet Take 40 mg by mouth daily at 6 PM.   . benazepril (LOTENSIN) 10 MG tablet Take 10 mg by mouth at bedtime.   . Calcium Carbonate-Vitamin D (CALTRATE 600+D PO) Take 1 tablet by mouth daily.  . Cholecalciferol (VITAMIN D-3) 5000 units TABS Take 1 tablet by mouth daily.  Marland Kitchen CINNAMON PO Take 1,000 mg by mouth daily.  .  hydrochlorothiazide (MICROZIDE) 12.5 MG capsule Take 1 capsule by mouth daily.  Marland Kitchen levothyroxine (SYNTHROID, LEVOTHROID) 88 MCG tablet Take 88 mcg by mouth daily before breakfast.  . metoprolol (LOPRESSOR) 50 MG tablet Take 25 mg by mouth 2 (two) times daily.  . naproxen (NAPROSYN) 500 MG tablet Take 500 mg by mouth daily as needed for mild pain.   Marland Kitchen omeprazole (PRILOSEC) 20 MG capsule Take 20 mg by mouth 2 (two) times a week. Wednesday and Sundays  . vitamin C (ASCORBIC ACID) 500 MG tablet Take 500 mg by mouth daily.  Marland Kitchen VITAMIN E PO Take 1 tablet by mouth daily.     Allergies  Allergen Reactions  . Codeine Nausea And Vomiting    Social History   Socioeconomic History  . Marital status: Married    Spouse name: Not on file  . Number of children: Not on file  . Years of education: Not on file  . Highest education level: Not on file  Occupational History  . Not on file  Social Needs  . Financial resource strain: Not on file  . Food insecurity:    Worry: Not on file    Inability: Not on file  . Transportation needs:    Medical: Not on file    Non-medical: Not on file  Tobacco Use  . Smoking status: Current Every Day Smoker    Packs/day: 0.25    Years: 55.00    Pack years: 13.75    Types: Cigarettes  .  Smokeless tobacco: Never Used  . Tobacco comment: patient has 1 cig every 2 days  Substance and Sexual Activity  . Alcohol use: No  . Drug use: No  . Sexual activity: Yes    Birth control/protection: Surgical  Lifestyle  . Physical activity:    Days per week: Not on file    Minutes per session: Not on file  . Stress: Not on file  Relationships  . Social connections:    Talks on phone: Not on file    Gets together: Not on file    Attends religious service: Not on file    Active member of club or organization: Not on file    Attends meetings of clubs or organizations: Not on file    Relationship status: Not on file  . Intimate partner violence:    Fear of current or  ex partner: Not on file    Emotionally abused: Not on file    Physically abused: Not on file    Forced sexual activity: Not on file  Other Topics Concern  . Not on file  Social History Narrative  . Not on file     Review of Systems: General: negative for chills, fever, night sweats or weight changes.  Cardiovascular: negative for chest pain, dyspnea on exertion, edema, orthopnea, palpitations, paroxysmal nocturnal dyspnea or shortness of breath Dermatological: negative for rash Respiratory: negative for cough or wheezing Urologic: negative for hematuria Abdominal: negative for nausea, vomiting, diarrhea, bright red blood per rectum, melena, or hematemesis Neurologic: negative for visual changes, syncope, or dizziness All other systems reviewed and are otherwise negative except as noted above.    Blood pressure 126/62, pulse 77, height 5\' 5"  (1.651 m), weight 158 lb 9.6 oz (71.9 kg).  General appearance: alert and no distress Neck: no adenopathy, no carotid bruit, no JVD, supple, symmetrical, trachea midline and thyroid not enlarged, symmetric, no tenderness/mass/nodules Lungs: clear to auscultation bilaterally Heart: regular rate and rhythm, S1, S2 normal, no murmur, click, rub or gallop Extremities: extremities normal, atraumatic, no cyanosis or edema Pulses: 2+ and symmetric Skin: Skin color, texture, turgor normal. No rashes or lesions Neurologic: Alert and oriented X 3, normal strength and tone. Normal symmetric reflexes. Normal coordination and gait  EKG sinus rhythm at 77 without ST or T-wave changes. I personally reviewed this EKG  ASSESSMENT AND PLAN:   Essential hypertension History of essential hypertension blood pressure measured at 126/62. She is on hydrochlorothiazide and Lotensin. Continue current meds at current dosing.  Hyperlipidemia History of hyperlipidemia on statin therapy followed by her PCP.  Carotid artery disease History of carotid artery disease  with Doppler performed 05/06/17 revealing moderate left ICA stenosis. This will be repeated on an annual basis.      Lorretta Harp MD FACP,FACC,FAHA, [facility] 12/23/2017 2:57 PM

## 2017-12-23 NOTE — Patient Instructions (Addendum)
Medication Instructions: Your physician recommends that you continue on your current medications as directed. Please refer to the Current Medication list given to you today.   Testing/Procedures: Please schedule a carotid doppler for August.  Follow-Up: Your physician recommends that you schedule a follow-up appointment as needed with Dr. Gwenlyn Found.  I have faxed your clearance letter to Dr. Rex Kras office.

## 2017-12-26 HISTORY — PX: LAMINECTOMY: SHX219

## 2018-01-05 DIAGNOSIS — M4716 Other spondylosis with myelopathy, lumbar region: Secondary | ICD-10-CM | POA: Insufficient documentation

## 2018-02-07 ENCOUNTER — Other Ambulatory Visit: Payer: Self-pay | Admitting: Cardiovascular Disease

## 2018-02-07 DIAGNOSIS — I6523 Occlusion and stenosis of bilateral carotid arteries: Secondary | ICD-10-CM

## 2018-02-08 ENCOUNTER — Other Ambulatory Visit (HOSPITAL_COMMUNITY): Payer: Self-pay | Admitting: Nurse Practitioner

## 2018-02-08 DIAGNOSIS — M4716 Other spondylosis with myelopathy, lumbar region: Secondary | ICD-10-CM

## 2018-02-15 ENCOUNTER — Ambulatory Visit (HOSPITAL_COMMUNITY)
Admission: RE | Admit: 2018-02-15 | Discharge: 2018-02-15 | Disposition: A | Discharge status: Home / Self Care | Payer: Medicare Other | Source: Ambulatory Visit | Attending: Nurse Practitioner | Admitting: Nurse Practitioner

## 2018-02-15 DIAGNOSIS — M5187 Other intervertebral disc disorders, lumbosacral region: Secondary | ICD-10-CM | POA: Insufficient documentation

## 2018-02-15 DIAGNOSIS — M4716 Other spondylosis with myelopathy, lumbar region: Secondary | ICD-10-CM | POA: Diagnosis not present

## 2018-02-15 DIAGNOSIS — M2578 Osteophyte, vertebrae: Secondary | ICD-10-CM | POA: Insufficient documentation

## 2018-02-15 DIAGNOSIS — M48061 Spinal stenosis, lumbar region without neurogenic claudication: Secondary | ICD-10-CM | POA: Insufficient documentation

## 2018-02-28 ENCOUNTER — Other Ambulatory Visit (HOSPITAL_COMMUNITY): Payer: Self-pay | Admitting: Nurse Practitioner

## 2018-02-28 ENCOUNTER — Ambulatory Visit (HOSPITAL_COMMUNITY)
Admission: RE | Admit: 2018-02-28 | Discharge: 2018-02-28 | Disposition: A | Discharge status: Home / Self Care | Payer: Medicare Other | Source: Ambulatory Visit | Attending: Nurse Practitioner | Admitting: Nurse Practitioner

## 2018-02-28 DIAGNOSIS — M4316 Spondylolisthesis, lumbar region: Secondary | ICD-10-CM

## 2018-02-28 DIAGNOSIS — I700 Atherosclerosis of aorta: Secondary | ICD-10-CM | POA: Diagnosis not present

## 2018-02-28 DIAGNOSIS — M5136 Other intervertebral disc degeneration, lumbar region: Secondary | ICD-10-CM | POA: Diagnosis not present

## 2018-02-28 DIAGNOSIS — Z885 Allergy status to narcotic agent status: Secondary | ICD-10-CM | POA: Diagnosis not present

## 2018-03-31 ENCOUNTER — Other Ambulatory Visit: Payer: Self-pay | Admitting: Nurse Practitioner

## 2018-03-31 DIAGNOSIS — M4716 Other spondylosis with myelopathy, lumbar region: Secondary | ICD-10-CM

## 2018-04-10 ENCOUNTER — Ambulatory Visit
Admission: RE | Admit: 2018-04-10 | Discharge: 2018-04-10 | Disposition: A | Discharge status: Home / Self Care | Payer: Medicare Other | Source: Ambulatory Visit | Attending: Nurse Practitioner | Admitting: Nurse Practitioner

## 2018-04-10 ENCOUNTER — Other Ambulatory Visit: Payer: Self-pay | Admitting: Nurse Practitioner

## 2018-04-10 DIAGNOSIS — M4716 Other spondylosis with myelopathy, lumbar region: Secondary | ICD-10-CM

## 2018-04-10 MED ORDER — IOPAMIDOL (ISOVUE-M 200) INJECTION 41%
1.0000 mL | Freq: Once | INTRAMUSCULAR | Status: AC
  Administered 2018-04-10: 1 mL via EPIDURAL

## 2018-04-10 MED ORDER — METHYLPREDNISOLONE ACETATE 40 MG/ML INJ SUSP (RADIOLOG
120.0000 mg | Freq: Once | INTRAMUSCULAR | Status: AC
  Administered 2018-04-10: 120 mg via EPIDURAL

## 2018-04-10 NOTE — Discharge Instructions (Signed)

## 2018-04-26 ENCOUNTER — Other Ambulatory Visit (HOSPITAL_COMMUNITY): Payer: Self-pay | Admitting: Neurosurgery

## 2018-04-26 DIAGNOSIS — M25851 Other specified joint disorders, right hip: Secondary | ICD-10-CM

## 2018-04-27 ENCOUNTER — Ambulatory Visit (HOSPITAL_COMMUNITY)
Admission: RE | Admit: 2018-04-27 | Discharge: 2018-04-27 | Disposition: A | Discharge status: Home / Self Care | Payer: Medicare Other | Source: Ambulatory Visit | Attending: Cardiology | Admitting: Cardiology

## 2018-04-27 DIAGNOSIS — I6523 Occlusion and stenosis of bilateral carotid arteries: Secondary | ICD-10-CM | POA: Diagnosis present

## 2018-05-01 ENCOUNTER — Other Ambulatory Visit: Payer: Self-pay | Admitting: *Deleted

## 2018-05-01 DIAGNOSIS — I6523 Occlusion and stenosis of bilateral carotid arteries: Secondary | ICD-10-CM

## 2018-05-02 ENCOUNTER — Ambulatory Visit (HOSPITAL_COMMUNITY)
Admission: RE | Admit: 2018-05-02 | Discharge: 2018-05-02 | Disposition: A | Discharge status: Home / Self Care | Payer: Medicare Other | Source: Ambulatory Visit | Attending: Neurosurgery | Admitting: Neurosurgery

## 2018-05-02 ENCOUNTER — Other Ambulatory Visit (HOSPITAL_COMMUNITY): Payer: Self-pay | Admitting: Family Medicine

## 2018-05-02 DIAGNOSIS — M160 Bilateral primary osteoarthritis of hip: Secondary | ICD-10-CM | POA: Diagnosis not present

## 2018-05-02 DIAGNOSIS — M533 Sacrococcygeal disorders, not elsewhere classified: Secondary | ICD-10-CM | POA: Insufficient documentation

## 2018-05-02 DIAGNOSIS — M25851 Other specified joint disorders, right hip: Secondary | ICD-10-CM | POA: Diagnosis present

## 2018-05-02 DIAGNOSIS — Z1231 Encounter for screening mammogram for malignant neoplasm of breast: Secondary | ICD-10-CM

## 2018-05-04 ENCOUNTER — Encounter (HOSPITAL_COMMUNITY): Payer: Self-pay

## 2018-05-04 ENCOUNTER — Ambulatory Visit (HOSPITAL_COMMUNITY)
Admission: RE | Admit: 2018-05-04 | Discharge: 2018-05-04 | Disposition: A | Discharge status: Home / Self Care | Payer: Medicare Other | Source: Ambulatory Visit | Attending: Family Medicine | Admitting: Family Medicine

## 2018-05-04 DIAGNOSIS — Z1231 Encounter for screening mammogram for malignant neoplasm of breast: Secondary | ICD-10-CM | POA: Diagnosis present

## 2018-05-11 ENCOUNTER — Ambulatory Visit (INDEPENDENT_AMBULATORY_CARE_PROVIDER_SITE_OTHER): Payer: Medicare Other | Admitting: Orthopaedic Surgery

## 2018-05-11 ENCOUNTER — Encounter: Payer: Self-pay | Admitting: Orthopaedic Surgery

## 2018-05-11 VITALS — BP 137/55 | HR 65 | Ht 65.0 in | Wt 159.0 lb

## 2018-05-11 DIAGNOSIS — M5441 Lumbago with sciatica, right side: Secondary | ICD-10-CM | POA: Diagnosis not present

## 2018-05-11 DIAGNOSIS — F17210 Nicotine dependence, cigarettes, uncomplicated: Secondary | ICD-10-CM

## 2018-05-11 DIAGNOSIS — I6523 Occlusion and stenosis of bilateral carotid arteries: Secondary | ICD-10-CM

## 2018-05-11 DIAGNOSIS — G8929 Other chronic pain: Secondary | ICD-10-CM

## 2018-05-11 NOTE — Progress Notes (Signed)
Subjective:    Patient ID: [name], female    DOB: 1941/03/08, 77 y.o.   MRN: [id number]  HPI She has a history of lower back pain and right sided sciatica for over a year to a year and a half.  She has been seen by Dr. Carloyn Manner in [place].  She had a MRI of the lumbar spine done 08-30-17 which showed: IMPRESSION: Chronic changes at L4-5 with asymmetric loss of interspace height, central and rightward protrusion with osseous spurring. Asymmetric facet arthropathy compounds subarticular zone and foraminal zone narrowing. RIGHT L5, and possibly RIGHT L4 nerve root impingement.  Central protrusion L5-S1. Annular rent also extends to the RIGHT. No clear-cut L5 or S1 nerve root impingement.  Small far-lateral and foraminal protrusion at L3-4 on the LEFT. Facet arthropathy. Borderline LEFT L3 and LEFT L4 nerve root Impingement.   She had surgery of the lumbar spine in April by Dr. Carloyn Manner.  She did well initially but began having pain again and pain to the right hip, down the posterior right thigh past the knee and to the right foot.  The pain has been getting worse.  She had a new MRI of the lumbar spine in May, May 29th showing: IMPRESSION: 1. Postoperative changes on the right at L4-L5 where lateral recess patency appears improved since the December MRI. Stable mild to moderate right L4 neural foraminal stenosis related to disc osteophyte complex and facet hypertrophy. 2. Stable and mild for age lumbar spine degeneration elsewhere. - a small annular fissure of the L5-S1 disc is in proximity to the right S1 nerve roots in the lateral recess. - mild bilateral L3 neural foraminal stenosis is greater on the Left.  She has had three epidurals done of the lumbar spine around the right L4 nerve root the most recent done 04-10-18.  It only helped slightly according to the patient.  She continued to have hip pain on the right also and she had a MRI of the right hip done 08-30-17 which  showed: IMPRESSION: 1. 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 2. Anterior superior right labral tear.  And she had another MRI of the right hip done 05-02-18 showing: 1. No acute findings or explanation for the patient's symptoms. Findings appear similar to previous MRI from 8 months ago. 2. Mild degenerative changes of the hips and sacroiliac joints with stable small degenerative right labral tear.  She continues to have pain in the lower back with paresthesias running down the right lower extremity.  She has pain now at rest.  She has pain more with activity.  She is not improving.  She takes gabepentin and Flexeril and Naprosyn which have not helped.  She has tried heat, ice, rubs and massage with no help.  Dr. Carloyn Manner has suggested new surgery and it is tentatively scheduled for mid October.  Her son lives in [place].  I know him and had dinner with him a few weeks ago when he was visiting.  He is friends with my daughter and my son-in-law.  He asked that I see his mother and I made the arrangements.  After I examined her today in the office, the patient called her son in [place].  We had a very long talk, conversation of over 35 minutes with him.  He went over her history to date and I went over the MRIs she had done and I explained them to him.  He would like for her to have another opinion, a surgical  consultation, by another neurosurgeon.  She is resistant to that.  She just wants to "get it over".  I have no problem with another opinion.  She does have irritation of the level below where the prior surgery was done and has not responded to very good conservative treatment and also epidurals.  She smokes.  We discussed this.  I have strongly urged her to stop completely before any surgery.  Her son is adamant about this as well.  The patient was counseled about smoking and smoking cessation.  I spent about eight minutes in doing this.  I, of course, determined the  patient does smoke and frequency.  The patient smokes less than a half a pack a day, some days just a few cigarettes.  I have advised the patient of problems medically that can occur with continued smoking including poor wound and bone healing as well as the obvious respiratory problems.  I have assessed the patient's willingness to cut back and quit smoking.  The patient has stated she will make every effort to stop prior to surgery.  I have told the patient to discuss with their family doctor also about smoking cessation.  I am willing to assist my patient in arranging this and to get additional help.I have suggested the patient have a set date to try to begin cutting back.  I have printed out a smoking cessation form about how to begin cutting back and suggestions.  I have recommended starting an immediate plan to cut back.  I will discuss this further when I see the patient in the future.  I have stressed that although this will be very difficult to accomplish, the benefits are very substantial and will give long term health benefits from now on.  Her son would like her seen at a university setting.  After going round and round with this today, I have suggested his mother agree to another opinion.  We hung up the phone with this agreed upon.  After hanging up the phone, she said he does not want to have surgery out of the county.  But she feels that [facility] would be best for her, maybe even [facility].  She will call and let us know to make the appointment for her.  That does not mean she would have to have surgery at the place of the second opinion.  Her son really wants her to go and she has finally reluctantly agreed to this.  We talked about this for another 12 to 15 minutes.      Review of Systems  Constitutional: Positive for activity change.  Respiratory: Positive for shortness of breath. Negative for cough.   Endocrine: Positive for cold intolerance.  Musculoskeletal:  Positive for arthralgias, back pain and gait problem.  Allergic/Immunologic: Positive for environmental allergies.  All other systems reviewed and are negative.  For Review of Systems, all other systems reviewed and are negative.  The following is a summary of the past history medically, past history surgically, known current medicines, social history and family history.  This information is gathered electronically by the computer from prior information and documentation.  I review this each visit and have found including this information at this point in the chart is beneficial and informative.   Past Medical History:  Diagnosis Date  . Arthritis   . Carotid artery disease ([place])   . COPD (chronic obstructive pulmonary disease) ([facility])   . Dysrhythmia    palpitations  . GERD (  gastroesophageal reflux disease)   . Hypercholesteremia   . Hypertension   . Hypothyroidism   . Shortness of breath     Past Surgical History:  Procedure Laterality Date  . ABDOMINAL HYSTERECTOMY    . APPENDECTOMY    . BREAST BIOPSY Left 05/04/2017   Procedure: BREAST BIOPSY WITH NEEDLE LOCALIZATION;  Surgeon: Aviva Signs, MD;  Location: [facility];  Service: General;  Laterality: Left;  needle loc at 8:00  . CATARACT EXTRACTION W/PHACO Left 09/30/2014   Procedure: CATARACT EXTRACTION PHACO AND INTRAOCULAR LENS PLACEMENT LEFT EYE;  Surgeon: Tonny Branch, MD;  Location: [facility];  Service: Ophthalmology;  Laterality: Left;  CDE:8.79  . CATARACT EXTRACTION W/PHACO Right 10/17/2014   Procedure: CATARACT EXTRACTION PHACO AND INTRAOCULAR LENS PLACEMENT RIGHT EYE;  Surgeon: Tonny Branch, MD;  Location: [facility];  Service: Ophthalmology;  Laterality: Right;  CDE: 7.80  . CHOLECYSTECTOMY N/A 04/30/2013   Procedure: Attempted LAPAROSCOPIC CHOLECYSTECTOMY ; converted to open procedure @ 1510;  Surgeon: Scherry Ran, MD;  Location: [facility];  Service: General;  Laterality: N/A;  . CHOLECYSTECTOMY N/A 04/30/2013   Procedure:  CHOLECYSTECTOMY;  Surgeon: Scherry Ran, MD;  Location: [facility];  Service: General;  Laterality: N/A;  procedure started @ 1520  . LAMINECTOMY  12/26/2017   spine surgery Dr Carloyn Manner  . TUBAL LIGATION      Current Outpatient Medications on File Prior to Visit  Medication Sig Dispense Refill  . albuterol (PROVENTIL HFA;VENTOLIN HFA) 108 (90 BASE) MCG/ACT inhaler Inhale 2 puffs into the lungs every 6 (six) hours as needed for wheezing or shortness of breath.    . ALPRAZolam (XANAX) 0.5 MG tablet Take 0.25 tablets by mouth at bedtime as needed for sleep.     Marland Kitchen aspirin EC 81 MG tablet Take 81 mg by mouth 2 (two) times a week. Wednesday and sundays    . atorvastatin (LIPITOR) 40 MG tablet Take 40 mg by mouth daily at 6 PM.     . benazepril (LOTENSIN) 10 MG tablet Take 10 mg by mouth at bedtime.     . Calcium Carbonate-Vitamin D (CALTRATE 600+D PO) Take 1 tablet by mouth daily.    . Cholecalciferol (VITAMIN D-3) 5000 units TABS Take 1 tablet by mouth daily.    Marland Kitchen CINNAMON PO Take 1,000 mg by mouth daily.    . hydrochlorothiazide (MICROZIDE) 12.5 MG capsule Take 1 capsule by mouth daily.    Marland Kitchen levothyroxine (SYNTHROID, LEVOTHROID) 88 MCG tablet Take 88 mcg by mouth daily before breakfast.    . metoprolol (LOPRESSOR) 50 MG tablet Take 25 mg by mouth 2 (two) times daily.    . naproxen (NAPROSYN) 500 MG tablet Take 500 mg by mouth daily as needed for mild pain.     Marland Kitchen omeprazole (PRILOSEC) 20 MG capsule Take 20 mg by mouth 2 (two) times a week. Wednesday and Sundays    . vitamin C (ASCORBIC ACID) 500 MG tablet Take 500 mg by mouth daily.    Marland Kitchen VITAMIN E PO Take 1 tablet by mouth daily.    Marland Kitchen acyclovir (ZOVIRAX) 200 MG capsule Take 200 mg by mouth daily as needed (infection).      Current Facility-Administered Medications on File Prior to Visit  Medication Dose Route Frequency Provider Last Rate Last Dose  . fentaNYL (SUBLIMAZE) injection 25-50 mcg  25-50 mcg Intravenous Q5 min PRN Lerry Liner, MD         Social History   Socioeconomic History  . Marital status:  Married    Spouse name: Not on file  . Number of children: Not on file  . Years of education: Not on file  . Highest education level: Not on file  Occupational History  . Not on file  Social Needs  . Financial resource strain: Not on file  . Food insecurity:    Worry: Not on file    Inability: Not on file  . Transportation needs:    Medical: Not on file    Non-medical: Not on file  Tobacco Use  . Smoking status: Current Every Day Smoker    Packs/day: 0.25    Years: 55.00    Pack years: 13.75    Types: Cigarettes  . Smokeless tobacco: Never Used  . Tobacco comment: patient has 1 cig every 2 days  Substance and Sexual Activity  . Alcohol use: No  . Drug use: No  . Sexual activity: Yes    Birth control/protection: Surgical  Lifestyle  . Physical activity:    Days per week: Not on file    Minutes per session: Not on file  . Stress: Not on file  Relationships  . Social connections:    Talks on phone: Not on file    Gets together: Not on file    Attends religious service: Not on file    Active member of club or organization: Not on file    Attends meetings of clubs or organizations: Not on file    Relationship status: Not on file  . Intimate partner violence:    Fear of current or ex partner: Not on file    Emotionally abused: Not on file    Physically abused: Not on file    Forced sexual activity: Not on file  Other Topics Concern  . Not on file  Social History Narrative  . Not on file    Family History  Problem Relation Age of Onset  . Heart failure Mother   . Cancer Father     BP (!) 137/55   Pulse 65   Ht 5\' 5"  (1.651 m)   Wt 159 lb (72.1 kg)   BMI 26.46 kg/m   Body mass index is 26.46 kg/m.     Objective:   Physical Exam  Constitutional: She is oriented to person, place, and time. She appears well-developed and well-nourished.  HENT:  Head: Normocephalic and atraumatic.  Eyes:  Pupils are equal, round, and reactive to light. Conjunctivae and EOM are normal.  Neck: Normal range of motion. Neck supple.  Cardiovascular: Normal rate, regular rhythm and intact distal pulses.  Pulmonary/Chest: Effort normal.  Abdominal: Soft.  Musculoskeletal:       Back:  Neurological: She is alert and oriented to person, place, and time. She has normal reflexes. She displays normal reflexes. No cranial nerve deficit. She exhibits normal muscle tone. Coordination normal.  Skin: Skin is warm and dry.  Psychiatric: She has a normal mood and affect. Her behavior is normal. Judgment and thought content normal.    I have reviewed multiple x-rays and multiple MRIs today of the patient done since December of last year.      Assessment & Plan:   Encounter Diagnoses  Name Primary?  . Chronic right-sided low back pain with right-sided sciatica Yes  . Cigarette nicotine dependence without complication    She will call for [place] to set up second opinion at university setting.  I was going to set up an appointment to return but she wants to see what  the other doctor says first and then call here.  Call if any problem.  Precautions discussed.   Electronically Signed Sanjuana Kava, MD 8/22/20192:34 PM

## 2018-11-09 ENCOUNTER — Encounter (INDEPENDENT_AMBULATORY_CARE_PROVIDER_SITE_OTHER): Payer: Self-pay | Admitting: Internal Medicine

## 2018-11-09 ENCOUNTER — Ambulatory Visit (INDEPENDENT_AMBULATORY_CARE_PROVIDER_SITE_OTHER): Payer: Medicare Other | Admitting: Internal Medicine

## 2018-11-09 VITALS — BP 148/74 | HR 84 | Temp 98.8°F | Ht 65.0 in | Wt 162.5 lb

## 2018-11-09 DIAGNOSIS — D508 Other iron deficiency anemias: Secondary | ICD-10-CM

## 2018-11-09 NOTE — Patient Instructions (Signed)
H and H and iron studies. 3 stool cards home with patient.

## 2018-11-09 NOTE — Progress Notes (Signed)
Subjective:    Patient ID: [name], female    DOB: 1940/10/27, 78 y.o.   MRN: [id number]  HPI Referred by Dr. Karie Kirks for anemia. She has not seen any blood in her stool. Appetite is good. No weight loss. No change in her stools.  She does have bouts of diarrhea at times. No family hx of colon cancer. She says her last colonoscopy was about 15 yrs ago (see below).   10/21/2018 H and H 11.0 and 33.5  06/11/2004 EGD with biopsy/colonoscopy. Screening colonoscopy. Epigastric pain   EGD IMPRESSION:  1.  Distal esophageal erosions consistent with mild erosive reflux      esophagitis.  Otherwise normal esophagus.  2.  Heaped up adenomatous-appearing prepyloric antral mucosa of uncertain      significance, biopsied.  3.  A single 0.75-cm antral prepyloric ulcer, also biopsied separately.  The      remainder of the gastric mucosa appeared normal.  4.  Patent pylorus.  5.  Normal first and second portions of the duodenum.   COLONOSCOPY IMPRESSION:  1.  External hemorrhoids.  Otherwise normal rectum.  2.  A few scattered left-sided diverticula.  The remainder of the colonic      mucosa appeared normal.    CBC    Component Value Date/Time   WBC 7.3 04/29/2017 1055   RBC 4.45 04/29/2017 1055   HGB 13.4 04/29/2017 1055   HCT 39.9 04/29/2017 1055   PLT 253 04/29/2017 1055   MCV 89.7 04/29/2017 1055   MCH 30.1 04/29/2017 1055   MCHC 33.6 04/29/2017 1055   RDW 12.5 04/29/2017 1055   LYMPHSABS 2.2 04/29/2017 1055   MONOABS 0.6 04/29/2017 1055   EOSABS 0.2 04/29/2017 1055   BASOSABS 0.0 04/29/2017 1055       Review of Systems Past Medical History:  Diagnosis Date  . Arthritis   . Carotid artery disease ([place])   . COPD (chronic obstructive pulmonary disease) ([place])   . Dysrhythmia    palpitations  . GERD (gastroesophageal reflux disease)   . Hypercholesteremia   . Hypertension   . Hypothyroidism   . Shortness of breath     Past Surgical History:  Procedure Laterality  Date  . ABDOMINAL HYSTERECTOMY    . APPENDECTOMY    . BREAST BIOPSY Left 05/04/2017   Procedure: BREAST BIOPSY WITH NEEDLE LOCALIZATION;  Surgeon: Aviva Signs, MD;  Location: [facility];  Service: General;  Laterality: Left;  needle loc at 8:00  . CATARACT EXTRACTION W/PHACO Left 09/30/2014   Procedure: CATARACT EXTRACTION PHACO AND INTRAOCULAR LENS PLACEMENT LEFT EYE;  Surgeon: Tonny Branch, MD;  Location: [facility];  Service: Ophthalmology;  Laterality: Left;  CDE:8.79  . CATARACT EXTRACTION W/PHACO Right 10/17/2014   Procedure: CATARACT EXTRACTION PHACO AND INTRAOCULAR LENS PLACEMENT RIGHT EYE;  Surgeon: Tonny Branch, MD;  Location: [facility];  Service: Ophthalmology;  Laterality: Right;  CDE: 7.80  . CHOLECYSTECTOMY N/A 04/30/2013   Procedure: Attempted LAPAROSCOPIC CHOLECYSTECTOMY ; converted to open procedure @ 1510;  Surgeon: Scherry Ran, MD;  Location: [facility];  Service: General;  Laterality: N/A;  . CHOLECYSTECTOMY N/A 04/30/2013   Procedure: CHOLECYSTECTOMY;  Surgeon: Scherry Ran, MD;  Location: [facility];  Service: General;  Laterality: N/A;  procedure started @ 1520  . LAMINECTOMY  12/26/2017   spine surgery Dr Carloyn Manner  . TUBAL LIGATION      Allergies  Allergen Reactions  . Codeine Nausea And Vomiting    Current Outpatient Medications on  File Prior to Visit  Medication Sig Dispense Refill  . acyclovir (ZOVIRAX) 200 MG capsule Take 200 mg by mouth daily as needed (infection).     Marland Kitchen albuterol (PROVENTIL HFA;VENTOLIN HFA) 108 (90 BASE) MCG/ACT inhaler Inhale 2 puffs into the lungs every 6 (six) hours as needed for wheezing or shortness of breath.    . ALPRAZolam (XANAX) 0.5 MG tablet Take 0.25 tablets by mouth at bedtime as needed for sleep.     Marland Kitchen aspirin EC 81 MG tablet Take 81 mg by mouth 2 (two) times a week. Wednesday and sundays    . atorvastatin (LIPITOR) 40 MG tablet Take 40 mg by mouth daily at 6 PM.     . benazepril (LOTENSIN) 10 MG tablet Take 10 mg by mouth at bedtime.     .  Cholecalciferol (VITAMIN D-3) 5000 units TABS Take 2,000 Units by mouth daily.     Marland Kitchen CINNAMON PO Take 1,000 mg by mouth daily.    Marland Kitchen gabapentin (NEURONTIN) 300 MG capsule Take 300 mg by mouth 3 (three) times daily.    . hydrochlorothiazide (MICROZIDE) 12.5 MG capsule Take 1 capsule by mouth daily.    Marland Kitchen levothyroxine (SYNTHROID, LEVOTHROID) 88 MCG tablet Take 88 mcg by mouth daily before breakfast.    . Multiple Vitamins-Minerals (PRESERVISION AREDS 2 PO) Take by mouth.    . naproxen (NAPROSYN) 500 MG tablet Take 500 mg by mouth daily as needed for mild pain.     Marland Kitchen omeprazole (PRILOSEC) 20 MG capsule Take 20 mg by mouth 2 (two) times a week. Wednesday and Sundays    . vitamin C (ASCORBIC ACID) 500 MG tablet Take 500 mg by mouth daily.    Marland Kitchen VITAMIN E PO Take 1 tablet by mouth daily.     Current Facility-Administered Medications on File Prior to Visit  Medication Dose Route Frequency Provider Last Rate Last Dose  . fentaNYL (SUBLIMAZE) injection 25-50 mcg  25-50 mcg Intravenous Q5 min PRN Lerry Liner, MD            Past Medical History:  Diagnosis Date  . Arthritis   . Carotid artery disease ([place])   . COPD (chronic obstructive pulmonary disease) ([place])   . Dysrhythmia    palpitations  . GERD (gastroesophageal reflux disease)   . Hypercholesteremia   . Hypertension   . Hypothyroidism   . Shortness of breath     Past Surgical History:  Procedure Laterality Date  . ABDOMINAL HYSTERECTOMY    . APPENDECTOMY    . BREAST BIOPSY Left 05/04/2017   Procedure: BREAST BIOPSY WITH NEEDLE LOCALIZATION;  Surgeon: Aviva Signs, MD;  Location: [facility];  Service: General;  Laterality: Left;  needle loc at 8:00  . CATARACT EXTRACTION W/PHACO Left 09/30/2014   Procedure: CATARACT EXTRACTION PHACO AND INTRAOCULAR LENS PLACEMENT LEFT EYE;  Surgeon: Tonny Branch, MD;  Location: [facility];  Service: Ophthalmology;  Laterality: Left;  CDE:8.79  . CATARACT EXTRACTION W/PHACO Right 10/17/2014   Procedure:  CATARACT EXTRACTION PHACO AND INTRAOCULAR LENS PLACEMENT RIGHT EYE;  Surgeon: Tonny Branch, MD;  Location: [facility];  Service: Ophthalmology;  Laterality: Right;  CDE: 7.80  . CHOLECYSTECTOMY N/A 04/30/2013   Procedure: Attempted LAPAROSCOPIC CHOLECYSTECTOMY ; converted to open procedure @ 1510;  Surgeon: Scherry Ran, MD;  Location: [facility];  Service: General;  Laterality: N/A;  . CHOLECYSTECTOMY N/A 04/30/2013   Procedure: CHOLECYSTECTOMY;  Surgeon: Scherry Ran, MD;  Location: [facility];  Service: General;  Laterality: N/A;  procedure started @ 1520  . LAMINECTOMY  12/26/2017   spine surgery Dr Carloyn Manner  . TUBAL LIGATION      Allergies  Allergen Reactions  . Codeine Nausea And Vomiting    Current Outpatient Medications on File Prior to Visit  Medication Sig Dispense Refill  . acyclovir (ZOVIRAX) 200 MG capsule Take 200 mg by mouth daily as needed (infection).     Marland Kitchen albuterol (PROVENTIL HFA;VENTOLIN HFA) 108 (90 BASE) MCG/ACT inhaler Inhale 2 puffs into the lungs every 6 (six) hours as needed for wheezing or shortness of breath.    . ALPRAZolam (XANAX) 0.5 MG tablet Take 0.25 tablets by mouth at bedtime as needed for sleep.     Marland Kitchen aspirin EC 81 MG tablet Take 81 mg by mouth 2 (two) times a week. Wednesday and sundays    . atorvastatin (LIPITOR) 40 MG tablet Take 40 mg by mouth daily at 6 PM.     . benazepril (LOTENSIN) 10 MG tablet Take 10 mg by mouth at bedtime.     . Cholecalciferol (VITAMIN D-3) 5000 units TABS Take 2,000 Units by mouth daily.     Marland Kitchen CINNAMON PO Take 1,000 mg by mouth daily.    Marland Kitchen gabapentin (NEURONTIN) 300 MG capsule Take 300 mg by mouth 3 (three) times daily.    . hydrochlorothiazide (MICROZIDE) 12.5 MG capsule Take 1 capsule by mouth daily.    Marland Kitchen levothyroxine (SYNTHROID, LEVOTHROID) 88 MCG tablet Take 88 mcg by mouth daily before breakfast.    . Multiple Vitamins-Minerals (PRESERVISION AREDS 2 PO) Take by mouth.    . naproxen (NAPROSYN) 500 MG tablet Take 500 mg by  mouth daily as needed for mild pain.     Marland Kitchen omeprazole (PRILOSEC) 20 MG capsule Take 20 mg by mouth 2 (two) times a week. Wednesday and Sundays    . vitamin C (ASCORBIC ACID) 500 MG tablet Take 500 mg by mouth daily.    Marland Kitchen VITAMIN E PO Take 1 tablet by mouth daily.     Current Facility-Administered Medications on File Prior to Visit  Medication Dose Route Frequency Provider Last Rate Last Dose  . fentaNYL (SUBLIMAZE) injection 25-50 mcg  25-50 mcg Intravenous Q5 min PRN Lerry Liner, MD         Objective:   Physical Exam Blood pressure (!) 148/74, pulse 84, temperature 98.8 F (37.1 C), height 5\' 5"  (1.651 m), weight 162 lb 8 oz (73.7 kg). Alert and oriented. Skin warm and dry. Oral mucosa is moist.   . Sclera anicteric, conjunctivae is pink. Thyroid not enlarged. No cervical lymphadenopathy. Lungs clear. Heart regular rate and rhythm.  Abdomen is soft. Bowel sounds are positive. No hepatomegaly. No abdominal masses felt. No tenderness.  No edema to lower extremities.         Assessment & Plan:  Slight drop in her hemoglobin. Am going to send 3 stools home with her. Am going to get iron studies and repeat H and H.  She will let me know when she is ready for a colonoscopy.

## 2018-11-11 LAB — IRON, TOTAL/TOTAL IRON BINDING CAP
%SAT: 13 % — AB (ref 16–45)
Iron: 48 ug/dL (ref 45–160)
TIBC: 368 mcg/dL (calc) (ref 250–450)

## 2018-11-11 LAB — HEMOGLOBIN AND HEMATOCRIT, BLOOD
HCT: 36.2 % (ref 35.0–45.0)
HEMOGLOBIN: 12.1 g/dL (ref 11.7–15.5)

## 2018-11-11 LAB — FERRITIN: Ferritin: 15 ng/mL — ABNORMAL LOW (ref 16–288)

## 2018-11-22 ENCOUNTER — Telehealth (INDEPENDENT_AMBULATORY_CARE_PROVIDER_SITE_OTHER): Payer: Self-pay | Admitting: *Deleted

## 2018-11-22 NOTE — Telephone Encounter (Signed)
Results given to patient. She will let me know when she is ready for the colonoscopy

## 2018-11-22 NOTE — Telephone Encounter (Signed)
   Diagnosis:    Result(s)   Card 1Negative:     Card 2: Negative:   Card 3:Negative:    Completed by: Thomas Hoff ,LPN   HEMOCCULT SENSA DEVELOPER: LOT#:  [id number] EXPIRATION DATE: 2021-11   HEMOCCULT SENSA CARD:  LOT#:  [id number] EXPIRATION DATE: 08/21   CARD CONTROL RESULTS:  POSITIVE: Positive NEGATIVE: Negaitve    ADDITIONAL COMMENTS:  Patient has not been contacted with results. Forwarded to the ordering provider.

## 2018-12-25 ENCOUNTER — Other Ambulatory Visit: Payer: Self-pay | Admitting: Nurse Practitioner

## 2018-12-25 DIAGNOSIS — M47816 Spondylosis without myelopathy or radiculopathy, lumbar region: Secondary | ICD-10-CM

## 2019-04-04 ENCOUNTER — Other Ambulatory Visit: Payer: Self-pay

## 2019-04-04 ENCOUNTER — Ambulatory Visit
Admission: RE | Admit: 2019-04-04 | Discharge: 2019-04-04 | Disposition: A | Discharge status: Home / Self Care | Payer: Medicare Other | Source: Ambulatory Visit | Attending: Nurse Practitioner | Admitting: Nurse Practitioner

## 2019-04-04 DIAGNOSIS — M47816 Spondylosis without myelopathy or radiculopathy, lumbar region: Secondary | ICD-10-CM

## 2019-04-04 MED ORDER — METHYLPREDNISOLONE ACETATE 40 MG/ML INJ SUSP (RADIOLOG
120.0000 mg | Freq: Once | INTRAMUSCULAR | Status: AC
  Administered 2019-04-04: 12:00:00 120 mg via INTRA_ARTICULAR

## 2019-04-04 MED ORDER — IOPAMIDOL (ISOVUE-M 200) INJECTION 41%
1.0000 mL | Freq: Once | INTRAMUSCULAR | Status: AC
  Administered 2019-04-04: 12:00:00 1 mL via INTRA_ARTICULAR

## 2019-04-18 ENCOUNTER — Other Ambulatory Visit (HOSPITAL_COMMUNITY): Payer: Self-pay | Admitting: Family Medicine

## 2019-04-18 DIAGNOSIS — Z1231 Encounter for screening mammogram for malignant neoplasm of breast: Secondary | ICD-10-CM

## 2019-04-30 ENCOUNTER — Other Ambulatory Visit: Payer: Self-pay

## 2019-04-30 ENCOUNTER — Other Ambulatory Visit (HOSPITAL_COMMUNITY): Payer: Self-pay | Admitting: Cardiovascular Disease

## 2019-04-30 ENCOUNTER — Ambulatory Visit (HOSPITAL_COMMUNITY)
Admission: RE | Admit: 2019-04-30 | Discharge: 2019-04-30 | Disposition: A | Discharge status: Home / Self Care | Payer: Medicare Other | Source: Ambulatory Visit | Attending: Cardiology | Admitting: Cardiology

## 2019-04-30 DIAGNOSIS — I6523 Occlusion and stenosis of bilateral carotid arteries: Secondary | ICD-10-CM

## 2019-05-11 ENCOUNTER — Encounter (HOSPITAL_COMMUNITY): Payer: Self-pay

## 2019-05-11 ENCOUNTER — Ambulatory Visit (HOSPITAL_COMMUNITY)
Admission: RE | Admit: 2019-05-11 | Discharge: 2019-05-11 | Disposition: A | Discharge status: Home / Self Care | Payer: Medicare Other | Source: Ambulatory Visit | Attending: Family Medicine | Admitting: Family Medicine

## 2019-05-11 ENCOUNTER — Other Ambulatory Visit: Payer: Self-pay

## 2019-05-11 DIAGNOSIS — Z1231 Encounter for screening mammogram for malignant neoplasm of breast: Secondary | ICD-10-CM | POA: Diagnosis present

## 2019-05-22 ENCOUNTER — Other Ambulatory Visit: Payer: Self-pay | Admitting: Family Medicine

## 2019-05-22 DIAGNOSIS — M25551 Pain in right hip: Secondary | ICD-10-CM

## 2019-05-24 ENCOUNTER — Other Ambulatory Visit: Payer: Self-pay

## 2019-05-24 ENCOUNTER — Ambulatory Visit
Admission: RE | Admit: 2019-05-24 | Discharge: 2019-05-24 | Disposition: A | Discharge status: Home / Self Care | Payer: Medicare Other | Source: Ambulatory Visit | Attending: Family Medicine | Admitting: Family Medicine

## 2019-05-24 DIAGNOSIS — M25551 Pain in right hip: Secondary | ICD-10-CM

## 2019-05-24 MED ORDER — IOPAMIDOL (ISOVUE-M 200) INJECTION 41%: 1.0000 mL | Freq: Once | INTRAMUSCULAR | Status: DC

## 2019-05-24 MED ORDER — METHYLPREDNISOLONE ACETATE 40 MG/ML INJ SUSP (RADIOLOG: 120.0000 mg | Freq: Once | INTRAMUSCULAR | Status: DC

## 2019-07-02 DIAGNOSIS — Z981 Arthrodesis status: Secondary | ICD-10-CM | POA: Insufficient documentation

## 2019-07-02 DIAGNOSIS — M51369 Other intervertebral disc degeneration, lumbar region without mention of lumbar back pain or lower extremity pain: Secondary | ICD-10-CM | POA: Insufficient documentation

## 2019-07-02 DIAGNOSIS — M7061 Trochanteric bursitis, right hip: Secondary | ICD-10-CM | POA: Insufficient documentation

## 2019-07-02 DIAGNOSIS — M5136 Other intervertebral disc degeneration, lumbar region: Secondary | ICD-10-CM | POA: Insufficient documentation

## 2019-09-18 ENCOUNTER — Ambulatory Visit: Payer: Medicare Other | Attending: Internal Medicine

## 2019-09-18 ENCOUNTER — Other Ambulatory Visit: Payer: Self-pay

## 2019-09-18 DIAGNOSIS — Z20822 Contact with and (suspected) exposure to covid-19: Secondary | ICD-10-CM

## 2019-09-19 LAB — NOVEL CORONAVIRUS, NAA: SARS-CoV-2, NAA: NOT DETECTED

## 2019-10-01 ENCOUNTER — Telehealth: Payer: Self-pay | Admitting: Nurse Practitioner

## 2019-10-01 ENCOUNTER — Other Ambulatory Visit: Payer: Self-pay | Admitting: Orthopedic Surgery

## 2019-10-01 DIAGNOSIS — M5136 Other intervertebral disc degeneration, lumbar region: Secondary | ICD-10-CM

## 2019-10-01 NOTE — Telephone Encounter (Signed)
Phone call to patient to verify medication list and allergies for myelogram procedure. Pt aware she will not need to hold any medications for this procedure. Pre and post procedure instructions reviewed with pt. Pt verbalized understanding. 

## 2019-10-11 ENCOUNTER — Ambulatory Visit
Admission: RE | Admit: 2019-10-11 | Discharge: 2019-10-11 | Disposition: A | Discharge status: Home / Self Care | Payer: Medicare Other | Source: Ambulatory Visit | Attending: Orthopedic Surgery | Admitting: Orthopedic Surgery

## 2019-10-11 DIAGNOSIS — M5136 Other intervertebral disc degeneration, lumbar region: Secondary | ICD-10-CM

## 2019-10-11 MED ORDER — DIAZEPAM 5 MG PO TABS
5.0000 mg | ORAL_TABLET | Freq: Once | ORAL | Status: AC
  Administered 2019-10-11: 5 mg via ORAL

## 2019-10-11 MED ORDER — IOPAMIDOL (ISOVUE-M 200) INJECTION 41%
20.0000 mL | Freq: Once | INTRAMUSCULAR | Status: AC
  Administered 2019-10-11: 20 mL via INTRATHECAL

## 2019-10-11 NOTE — Discharge Instructions (Signed)

## 2019-10-16 ENCOUNTER — Ambulatory Visit: Payer: Medicare Other

## 2019-10-25 ENCOUNTER — Ambulatory Visit: Payer: Medicare Other | Attending: Internal Medicine

## 2019-10-25 DIAGNOSIS — Z23 Encounter for immunization: Secondary | ICD-10-CM

## 2019-10-25 NOTE — Progress Notes (Signed)
   Covid-19 Vaccination Clinic  Name:  [name]    MRN: [id number] DOB: 1941/06/28  10/25/2019  Ms. [name] was observed post Covid-19 immunization for 15 minutes without incidence. She was provided with Vaccine Information Sheet and instruction to access the V-Safe system.   Ms. [name] was instructed to call 911 with any severe reactions post vaccine: Marland Kitchen Difficulty breathing  . Swelling of your face and throat  . A fast heartbeat  . A bad rash all over your body  . Dizziness and weakness    Immunizations Administered    Name Date Dose VIS Date Route   Pfizer COVID-19 Vaccine 10/25/2019  1:50 PM 0.3 mL 08/31/2019 Intramuscular   Manufacturer: [place]   [place]ot: [id number]   [place]: [id number]

## 2019-11-19 ENCOUNTER — Ambulatory Visit: Payer: Medicare Other | Attending: Internal Medicine

## 2019-11-19 DIAGNOSIS — Z23 Encounter for immunization: Secondary | ICD-10-CM

## 2019-11-19 NOTE — Progress Notes (Signed)
   Covid-19 Vaccination Clinic  Name:  LAIA SAVIANO    MRN: [id number] DOB: 03-23-1941  11/19/2019  Ms. Floor was observed post Covid-19 immunization for 15 minutes without incidence. She was provided with Vaccine Information Sheet and instruction to access the V-Safe system.   Ms. Kontos was instructed to call 911 with any severe reactions post vaccine: Marland Kitchen Difficulty breathing  . Swelling of your face and throat  . A fast heartbeat  . A bad rash all over your body  . Dizziness and weakness    Immunizations Administered    Name Date Dose VIS Date Route   Pfizer COVID-19 Vaccine 11/19/2019  4:20 PM 0.3 mL 08/31/2019 Intramuscular   Manufacturer: [place]   Lot: [id number]   [place]: [id number]

## 2019-11-27 ENCOUNTER — Other Ambulatory Visit: Payer: Self-pay

## 2019-11-27 ENCOUNTER — Emergency Department (HOSPITAL_COMMUNITY)
Admission: EM | Admit: 2019-11-27 | Discharge: 2019-11-27 | Disposition: A | Discharge status: Home / Self Care | Payer: Medicare Other | Attending: Emergency Medicine | Admitting: Emergency Medicine

## 2019-11-27 ENCOUNTER — Emergency Department (HOSPITAL_COMMUNITY): Payer: Medicare Other

## 2019-11-27 ENCOUNTER — Encounter (HOSPITAL_COMMUNITY): Payer: Self-pay | Admitting: Emergency Medicine

## 2019-11-27 DIAGNOSIS — Z7982 Long term (current) use of aspirin: Secondary | ICD-10-CM | POA: Diagnosis not present

## 2019-11-27 DIAGNOSIS — I10 Essential (primary) hypertension: Secondary | ICD-10-CM | POA: Diagnosis not present

## 2019-11-27 DIAGNOSIS — E039 Hypothyroidism, unspecified: Secondary | ICD-10-CM | POA: Diagnosis not present

## 2019-11-27 DIAGNOSIS — Z79899 Other long term (current) drug therapy: Secondary | ICD-10-CM | POA: Insufficient documentation

## 2019-11-27 DIAGNOSIS — K529 Noninfective gastroenteritis and colitis, unspecified: Secondary | ICD-10-CM | POA: Insufficient documentation

## 2019-11-27 DIAGNOSIS — R197 Diarrhea, unspecified: Secondary | ICD-10-CM | POA: Diagnosis present

## 2019-11-27 DIAGNOSIS — F17210 Nicotine dependence, cigarettes, uncomplicated: Secondary | ICD-10-CM | POA: Insufficient documentation

## 2019-11-27 DIAGNOSIS — J449 Chronic obstructive pulmonary disease, unspecified: Secondary | ICD-10-CM | POA: Insufficient documentation

## 2019-11-27 LAB — CBC WITH DIFFERENTIAL/PLATELET
Abs Immature Granulocytes: 0.04 10*3/uL (ref 0.00–0.07)
Basophils Absolute: 0.1 10*3/uL (ref 0.0–0.1)
Basophils Relative: 1 %
Eosinophils Absolute: 0.2 10*3/uL (ref 0.0–0.5)
Eosinophils Relative: 2 %
HCT: 37.4 % (ref 36.0–46.0)
Hemoglobin: 11.8 g/dL — ABNORMAL LOW (ref 12.0–15.0)
Immature Granulocytes: 1 %
Lymphocytes Relative: 25 %
Lymphs Abs: 1.9 10*3/uL (ref 0.7–4.0)
MCH: 29.5 pg (ref 26.0–34.0)
MCHC: 31.6 g/dL (ref 30.0–36.0)
MCV: 93.5 fL (ref 80.0–100.0)
Monocytes Absolute: 0.8 10*3/uL (ref 0.1–1.0)
Monocytes Relative: 11 %
Neutro Abs: 4.6 10*3/uL (ref 1.7–7.7)
Neutrophils Relative %: 60 %
Platelets: 347 10*3/uL (ref 150–400)
RBC: 4 MIL/uL (ref 3.87–5.11)
RDW: 12.5 % (ref 11.5–15.5)
WBC: 7.6 10*3/uL (ref 4.0–10.5)
nRBC: 0 % (ref 0.0–0.2)

## 2019-11-27 LAB — COMPREHENSIVE METABOLIC PANEL
ALT: 8 U/L (ref 0–44)
AST: 13 U/L — ABNORMAL LOW (ref 15–41)
Albumin: 3.7 g/dL (ref 3.5–5.0)
Alkaline Phosphatase: 105 U/L (ref 38–126)
Anion gap: 11 (ref 5–15)
BUN: 12 mg/dL (ref 8–23)
CO2: 23 mmol/L (ref 22–32)
Calcium: 9.6 mg/dL (ref 8.9–10.3)
Chloride: 101 mmol/L (ref 98–111)
Creatinine, Ser: 1.68 mg/dL — ABNORMAL HIGH (ref 0.44–1.00)
GFR calc Af Amer: 33 mL/min — ABNORMAL LOW (ref >60)
GFR calc non Af Amer: 29 mL/min — ABNORMAL LOW (ref >60)
Glucose, Bld: 152 mg/dL — ABNORMAL HIGH (ref 70–99)
Potassium: 3.4 mmol/L — ABNORMAL LOW (ref 3.5–5.1)
Sodium: 135 mmol/L (ref 135–145)
Total Bilirubin: 0.4 mg/dL (ref 0.3–1.2)
Total Protein: 6.7 g/dL (ref 6.5–8.1)

## 2019-11-27 LAB — LIPASE, BLOOD: Lipase: 29 U/L (ref 11–51)

## 2019-11-27 MED ORDER — CIPROFLOXACIN HCL 500 MG PO TABS: 500.0000 mg | ORAL_TABLET | Freq: Two times a day (BID) | ORAL | 0 refills | Status: DC

## 2019-11-27 MED ORDER — SODIUM CHLORIDE 0.9 % IV BOLUS
1000.0000 mL | Freq: Once | INTRAVENOUS | Status: AC
  Administered 2019-11-27: 10:00:00 1000 mL via INTRAVENOUS

## 2019-11-27 NOTE — ED Notes (Signed)
Family updated as to patient's status.

## 2019-11-27 NOTE — ED Provider Notes (Signed)
[facility] EMERGENCY DEPARTMENT Provider Note   CSN: [id number] Arrival date & time: 11/27/19  [id number]     History Chief Complaint  Patient presents with  . Diarrhea    [name] is a 79 y.o. female.  Patient complains of diarrhea.  Her family doctor placed her on Flagyl.  She states she still having diarrhea, no blood on diarrhea  The history is provided by the patient. No language interpreter was used.  Diarrhea Quality:  Semi-solid Severity:  Mild Onset quality:  Sudden Timing:  Constant Progression:  Worsening Relieved by:  Nothing Worsened by:  Nothing Ineffective treatments: Flagyl. Associated symptoms: no abdominal pain and no headaches        Past Medical History:  Diagnosis Date  . Arthritis   . Carotid artery disease ([place])   . COPD (chronic obstructive pulmonary disease) ([place])   . Dysrhythmia    palpitations  . GERD (gastroesophageal reflux disease)   . Hypercholesteremia   . Hypertension   . Hypothyroidism   . Shortness of breath     Patient Active Problem List   Diagnosis Date Noted  . Mammographic breast lesion   . Carpal tunnel syndrome of right wrist 04/20/2016  . Essential hypertension 04/10/2014  . Hyperlipidemia 04/10/2014  . Carotid artery disease ([place]) 04/10/2014    Past Surgical History:  Procedure Laterality Date  . ABDOMINAL HYSTERECTOMY    . APPENDECTOMY    . BREAST BIOPSY Left 05/04/2017   Procedure: BREAST BIOPSY WITH NEEDLE LOCALIZATION;  Surgeon: Aviva Signs, MD;  Location: [facility];  Service: General;  Laterality: Left;  needle loc at 8:00  . CATARACT EXTRACTION W/PHACO Left 09/30/2014   Procedure: CATARACT EXTRACTION PHACO AND INTRAOCULAR LENS PLACEMENT LEFT EYE;  Surgeon: Tonny Branch, MD;  Location: [facility];  Service: Ophthalmology;  Laterality: Left;  CDE:8.79  . CATARACT EXTRACTION W/PHACO Right 10/17/2014   Procedure: CATARACT EXTRACTION PHACO AND INTRAOCULAR LENS PLACEMENT RIGHT EYE;  Surgeon: Tonny Branch, MD;  Location: [facility]  ORS;  Service: Ophthalmology;  Laterality: Right;  CDE: 7.80  . CHOLECYSTECTOMY N/A 04/30/2013   Procedure: Attempted LAPAROSCOPIC CHOLECYSTECTOMY ; converted to open procedure @ 1510;  Surgeon: Scherry Ran, MD;  Location: [facility];  Service: General;  Laterality: N/A;  . CHOLECYSTECTOMY N/A 04/30/2013   Procedure: CHOLECYSTECTOMY;  Surgeon: Scherry Ran, MD;  Location: [facility];  Service: General;  Laterality: N/A;  procedure started @ 1520  . LAMINECTOMY  12/26/2017   spine surgery Dr Carloyn Manner  . TUBAL LIGATION       OB History   No obstetric history on file.     Family History  Problem Relation Age of Onset  . Heart failure Mother   . Cancer Father     Social History   Tobacco Use  . Smoking status: Current Every Day Smoker    Packs/day: 0.25    Years: 55.00    Pack years: 13.75    Types: Cigarettes  . Smokeless tobacco: Never Used  . Tobacco comment: patient has 1 cig every 2 days  Substance Use Topics  . Alcohol use: No  . Drug use: No    Home Medications Prior to Admission medications   Medication Sig Start Date End Date Taking? Authorizing Provider  acyclovir (ZOVIRAX) 200 MG capsule Take 200 mg by mouth daily as needed (infection).    Yes [provider]  albuterol (PROVENTIL HFA;VENTOLIN HFA) 108 (90 BASE) MCG/ACT inhaler Inhale 2 puffs into the lungs every 6 (six)  hours as needed for wheezing or shortness of breath.   Yes [provider]  ALPRAZolam Duanne Moron) 0.5 MG tablet Take 0.25 tablets by mouth at bedtime as needed for sleep.  02/06/14  Yes [provider]  aspirin EC 81 MG tablet Take 81 mg by mouth 2 (two) times a week. Wednesday and sundays 05/02/13  Yes Felicie Morn, MD  atorvastatin (LIPITOR) 40 MG tablet Take 40 mg by mouth daily at 6 PM.    Yes [provider]  benazepril (LOTENSIN) 10 MG tablet Take 10 mg by mouth at bedtime.    Yes [provider]  Cholecalciferol (VITAMIN D-3) 5000 units TABS Take  2,000 Units by mouth daily.    Yes [provider]  hydrochlorothiazide (MICROZIDE) 12.5 MG capsule Take 1 capsule by mouth daily. 09/25/14  Yes [provider]  levothyroxine (SYNTHROID, LEVOTHROID) 88 MCG tablet Take 88 mcg by mouth daily before breakfast.   Yes [provider]  metroNIDAZOLE (FLAGYL) 500 MG tablet Take 500 mg by mouth 2 (two) times daily. 11/23/19  Yes [provider]  Multiple Vitamins-Minerals (PRESERVISION AREDS 2 PO) Take by mouth.   Yes [provider]  naproxen (NAPROSYN) 500 MG tablet Take 500 mg by mouth daily as needed for mild pain.  12/19/14  Yes [provider]  omeprazole (PRILOSEC) 20 MG capsule Take 20 mg by mouth 2 (two) times a week. Wednesday and Sundays   Yes [provider]  vitamin C (ASCORBIC ACID) 500 MG tablet Take 500 mg by mouth daily.   Yes [provider]  ciprofloxacin (CIPRO) 500 MG tablet Take 1 tablet (500 mg total) by mouth 2 (two) times daily. One po bid x 7 days 11/27/19   Milton Ferguson, MD    Allergies    Codeine  Review of Systems   Review of Systems  Constitutional: Negative for appetite change and fatigue.  HENT: Negative for congestion, ear discharge and sinus pressure.   Eyes: Negative for discharge.  Respiratory: Negative for cough.   Cardiovascular: Negative for chest pain.  Gastrointestinal: Positive for diarrhea. Negative for abdominal pain.  Genitourinary: Negative for frequency and hematuria.  Musculoskeletal: Negative for back pain.  Skin: Negative for rash.  Neurological: Negative for seizures and headaches.  Psychiatric/Behavioral: Negative for hallucinations.    Physical Exam Updated Vital Signs BP 94/69   Pulse 86   Temp 98.2 F (36.8 C) (Oral)   Resp 17   Ht 5\' 5"  (1.651 m)   Wt 70.8 kg   SpO2 99%   BMI 25.96 kg/m   Physical Exam Vitals and nursing note reviewed.  Constitutional:      Appearance: She is well-developed.  HENT:      Head: Normocephalic.     Nose: Nose normal.  Eyes:     General: No scleral icterus.    Conjunctiva/sclera: Conjunctivae normal.  Neck:     Thyroid: No thyromegaly.  Cardiovascular:     Rate and Rhythm: Normal rate and regular rhythm.     Heart sounds: No murmur. No friction rub. No gallop.   Pulmonary:     Breath sounds: No stridor. No wheezing or rales.  Chest:     Chest wall: No tenderness.  Abdominal:     General: There is no distension.     Tenderness: There is abdominal tenderness. There is no rebound.  Musculoskeletal:        General: Normal range of motion.     Cervical back: Neck supple.  Lymphadenopathy:     Cervical: No cervical adenopathy.  Skin:    Findings: No erythema or rash.  Neurological:     Mental Status: She is alert and oriented to person, place, and time.     Motor: No abnormal muscle tone.     Coordination: Coordination normal.  Psychiatric:        Behavior: Behavior normal.     ED Results / Procedures / Treatments   Labs (all labs ordered are listed, but only abnormal results are displayed) Labs Reviewed  CBC WITH DIFFERENTIAL/PLATELET - Abnormal; Notable for the following components:      Result Value   Hemoglobin 11.8 (*)    All other components within normal limits  COMPREHENSIVE METABOLIC PANEL - Abnormal; Notable for the following components:   Potassium 3.4 (*)    Glucose, Bld 152 (*)    Creatinine, Ser 1.68 (*)    AST 13 (*)    GFR calc non Af Amer 29 (*)    GFR calc Af Amer 33 (*)    All other components within normal limits  LIPASE, BLOOD    EKG None  Radiology CT ABDOMEN PELVIS WO CONTRAST  Result Date: 11/27/2019 CLINICAL DATA:  Acute generalized abdominal pain with neutropenia EXAM: CT ABDOMEN AND PELVIS WITHOUT CONTRAST TECHNIQUE: Multidetector CT imaging of the abdomen and pelvis was performed following the standard protocol without IV contrast. COMPARISON:  04/12/2013 FINDINGS: Lower chest:  Coronary atherosclerosis.   Clear lower lungs. Hepatobiliary: Mass in the subcapsular right liver with capsule deformation. This reflected a hemangioma based on prior study.Interval cholecystectomy. 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 No bile duct dilatation. Pancreas: Generalized fatty infiltration Spleen: Unremarkable. Adrenals/Urinary Tract: 3.1 cm left adrenal adenoma. There is a 13 mm right adrenal adenoma. No hydronephrosis or stone. Unremarkable bladder. Stomach/Bowel: Diffuse colonic fluid levels. There is suggestion of colonic wall thickening that is greatest at the sigmoid where there is also mesenteric stranding. No small bowel dilatation. Appendectomy. Vascular/Lymphatic: No acute vascular finding. Diffuse atherosclerotic calcification. 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 No mass or adenopathy. Reproductive:Hysterectomy Other: No ascites or pneumoperitoneum. Fatty bilateral inguinal hernia. Musculoskeletal: No acute abnormalities. [id number] PLIF with uncertain arthrodesis status. IMPRESSION: 1. Nonspecific colitis. 2. Multiple chronic findings as described above. Electronically Signed   By: Monte Fantasia M.D.   On: 11/27/2019 11:48    Procedures Procedures (including critical care time)  Medications Ordered in ED Medications  sodium chloride 0.9 % bolus 1,000 mL (0 mLs Intravenous Stopped 11/27/19 1120)    ED Course  I have reviewed the triage vital signs and the nursing notes.  Pertinent labs & imaging results that were available during my care of the patient were reviewed by me and considered in my medical decision making (see chart for details).    MDM Rules/Calculators/A&P                     CT scan shows colitis.  Patient will be started on Cipro to add to the Flagyl she is taken and she is referred to follow-up with her GI doctor next week Final Clinical Impression(s) / ED Diagnoses Final diagnoses:  Colitis    Rx / DC Orders ED Discharge Orders          Ordered    ciprofloxacin (CIPRO) 500 MG tablet  2 times daily     11/27/19 1209           Milton Ferguson, MD  11/27/19 1213  

## 2019-11-27 NOTE — ED Triage Notes (Signed)
Pt c/o of diarrhea x 1 month. Took ammonium at home with no relief.

## 2019-11-27 NOTE — Discharge Instructions (Addendum)
Follow up with dr. Sydell Axon next week.

## 2019-11-29 ENCOUNTER — Encounter: Payer: Self-pay | Admitting: Gastroenterology

## 2019-11-29 NOTE — Progress Notes (Signed)
Referring Provider:[place] [facility] Primary Care Physician:  Lemmie Evens, MD Primary Gastroenterologist:  Dr. Gala Romney  Chief Complaint  Patient presents with  . Diarrhea    >3 weeks  . Nausea    vomits when takes Flagyl    HPI:   [name] is a 79 y.o. female presenting today at the request of [facility] for follow-up of colitis.    Patient was seen in the ED on 11/27/2019 with chief complaint of diarrhea.  PCP had placed her on Flagyl but diarrhea continued.  Labs remarkable for hemoglobin 11.8  (L), potassium 3.4 (L), creatinine 1.68 (H).  CT abdomen and pelvis without contrast with suggestion of colonic wall thickening that is greatest at the sigmoid where there is also mesenteric stranding.  Stated to be nonspecific colitis. She was given 1 L normal saline and started on ciprofloxacin 500 mg twice daily x7 days in addition to the Flagyl she was already on.  Advised to follow-up with primary GI doc in 1 week.   Last TCS 06/11/2004: External hemorrhoids, few scattered left-sided diverticula, otherwise normal.  Repeat colonoscopy in 10 years.  Today: Diarrhea present for more than 3 weeks. Stools are watery. Has already had 5-6 today. About 10 stools a day. Taking imodium daily. Taking imodium after the first lose BM. Will get relief for about 4 hours. Will still have 5-6 BMs a day. Always wakes up at night around 3:30am with diarrhea. States she can't eat. Appetite is decreased. Started about 2 weeks ago. Having nausea. Had daily vomiting on flagyl. No hematemesis. Hasn't taken flagyl today. Has completed 7 days of Flagyl.   No improvement at all in diarrhea with antibiotics. Prior to diarrhea, would have soft formed BMs daily.   Had similar episode in January last year. Resolved on its own.   Losing weight. Eating mostly crackers and bananas. Drinking plenty of fluids. Some days urine is more yellow, most of the time it is clear. Drinking Gatorade, Pedialyte, water, and Dr. Malachi Bonds.    Occasional upper abdominal pain. Once a week or less. No blood in the stool. No melena. Stools will get dark on pepto.   No sick contacts. No recent antibiotics, hospitalization. Drinks well water. No contact with live stock.   No GERD symptoms. No dysphagia.   No fever or chills. Intermittent lightheadedness/dizziness. No pre-syncope or syncope. Has weakness.   Naproxen as needed. Twice a day at times.   No family history of colon cancer or inflammatory bowel disease.  Past Medical History:  Diagnosis Date  . Arthritis   . Carotid artery disease ([place])   . COPD (chronic obstructive pulmonary disease) ([place])   . Dysrhythmia    palpitations  . GERD (gastroesophageal reflux disease)   . Hypercholesteremia   . Hypertension   . Hypothyroidism   . Shortness of breath     Past Surgical History:  Procedure Laterality Date  . ABDOMINAL HYSTERECTOMY    . APPENDECTOMY    . BREAST BIOPSY Left 05/04/2017   Procedure: BREAST BIOPSY WITH NEEDLE LOCALIZATION;  Surgeon: Aviva Signs, MD;  Location: [facility];  Service: General;  Laterality: Left;  needle loc at 8:00  . CATARACT EXTRACTION W/PHACO Left 09/30/2014   Procedure: CATARACT EXTRACTION PHACO AND INTRAOCULAR LENS PLACEMENT LEFT EYE;  Surgeon: Tonny Branch, MD;  Location: [facility];  Service: Ophthalmology;  Laterality: Left;  CDE:8.79  . CATARACT EXTRACTION W/PHACO Right 10/17/2014   Procedure: CATARACT EXTRACTION PHACO AND INTRAOCULAR LENS PLACEMENT RIGHT  EYE;  Surgeon: Tonny Branch, MD;  Location: [facility];  Service: Ophthalmology;  Laterality: Right;  CDE: 7.80  . CHOLECYSTECTOMY N/A 04/30/2013   Procedure: Attempted LAPAROSCOPIC CHOLECYSTECTOMY ; converted to open procedure @ 1510;  Surgeon: Scherry Ran, MD;  Location: [facility];  Service: General;  Laterality: N/A;  . CHOLECYSTECTOMY N/A 04/30/2013   Procedure: CHOLECYSTECTOMY;  Surgeon: Scherry Ran, MD;  Location: [facility];  Service: General;  Laterality: N/A;  procedure started @  1520  . COLONOSCOPY  06/11/2004   External hemorrhoids, few scattered left-sided diverticula, otherwise normal.  Repeat colonoscopy in 10 years.  . ESOPHAGOGASTRODUODENOSCOPY  06/11/2004   Distal esophageal erosions consistent with mild erosive reflux esophagitis, heaped up adenomatous appearing prepyloric antral mucosa biopsy, single antral prepyloric ulcer biopsied, normal D1 and D2.  . ESOPHAGOGASTRODUODENOSCOPY  10/07/2004   Distal esophageal erosions consistent with erosive reflux esophagitis, otherwise normal esophagus, normal stomach, previously noted ulcer healed completely, normal D1 and D2.  Marland Kitchen LAMINECTOMY  12/26/2017   spine surgery Dr Carloyn Manner  . TUBAL LIGATION      Current Outpatient Medications  Medication Sig Dispense Refill  . acyclovir (ZOVIRAX) 200 MG capsule Take 200 mg by mouth daily as needed (infection).     Marland Kitchen albuterol (PROVENTIL HFA;VENTOLIN HFA) 108 (90 BASE) MCG/ACT inhaler Inhale 2 puffs into the lungs every 6 (six) hours as needed for wheezing or shortness of breath.    . ALPRAZolam (XANAX) 0.5 MG tablet Take 0.25 tablets by mouth at bedtime as needed for sleep.     Marland Kitchen aspirin EC 81 MG tablet Take 81 mg by mouth 2 (two) times a week. Wednesday and sundays    . atorvastatin (LIPITOR) 40 MG tablet Take 40 mg by mouth daily at 6 PM.     . benazepril (LOTENSIN) 10 MG tablet Take 10 mg by mouth at bedtime.     . Cholecalciferol (VITAMIN D-3) 5000 units TABS Take 2,000 Units by mouth daily.     . ciprofloxacin (CIPRO) 500 MG tablet Take 1 tablet (500 mg total) by mouth 2 (two) times daily. One po bid x 7 days 14 tablet 0  . hydrochlorothiazide (MICROZIDE) 12.5 MG capsule Take 1 capsule by mouth daily.    Marland Kitchen levothyroxine (SYNTHROID, LEVOTHROID) 88 MCG tablet Take 88 mcg by mouth daily before breakfast.    . loperamide (IMODIUM) 2 MG capsule Take 2-4 mg by mouth as needed for diarrhea or loose stools.    . metroNIDAZOLE (FLAGYL) 500 MG tablet Take 500 mg by mouth 2 (two) times  daily.    . Multiple Vitamins-Minerals (PRESERVISION AREDS 2 PO) Take by mouth.    . naproxen (NAPROSYN) 500 MG tablet Take 500 mg by mouth daily as needed for mild pain.     Marland Kitchen omeprazole (PRILOSEC) 20 MG capsule Take 20 mg by mouth 2 (two) times a week. Wednesday and Sundays    . vitamin C (ASCORBIC ACID) 500 MG tablet Take 500 mg by mouth daily.     No current facility-administered medications for this visit.   Facility-Administered Medications Ordered in Other Visits  Medication Dose Route Frequency Provider Last Rate Last Admin  . fentaNYL (SUBLIMAZE) injection 25-50 mcg  25-50 mcg Intravenous Q5 min PRN Lerry Liner, MD        Allergies as of 11/30/2019 - Review Complete 11/30/2019  Allergen Reaction Noted  . Codeine Nausea And Vomiting 04/26/2013    Family History  Problem Relation Age of Onset  . Heart failure  Mother   . Cancer Father     Social History   Socioeconomic History  . Marital status: Married    Spouse name: Not on file  . Number of children: Not on file  . Years of education: Not on file  . Highest education level: Not on file  Occupational History  . Not on file  Tobacco Use  . Smoking status: Current Every Day Smoker    Packs/day: 0.25    Years: 55.00    Pack years: 13.75    Types: Cigarettes  . Smokeless tobacco: Never Used  . Tobacco comment: 4-5 cigs/day  Substance and Sexual Activity  . Alcohol use: No  . Drug use: No  . Sexual activity: Yes    Birth control/protection: Surgical  Other Topics Concern  . Not on file  Social History Narrative  . Not on file   Social Determinants of Health   Financial Resource Strain:   . Difficulty of Paying Living Expenses:   Food Insecurity:   . Worried About Charity fundraiser in the Last Year:   . Arboriculturist in the Last Year:   Transportation Needs:   . Film/video editor (Medical):   Marland Kitchen Lack of Transportation (Non-Medical):   Physical Activity:   . Days of Exercise per Week:   .  Minutes of Exercise per Session:   Stress:   . Feeling of Stress :   Social Connections:   . Frequency of Communication with Friends and Family:   . Frequency of Social Gatherings with Friends and Family:   . Attends Religious Services:   . Active Member of Clubs or Organizations:   . Attends Archivist Meetings:   Marland Kitchen Marital Status:   Intimate Partner Violence:   . Fear of Current or Ex-Partner:   . Emotionally Abused:   Marland Kitchen Physically Abused:   . Sexually Abused:     Review of Systems: Gen: See HPI CV: Denies chest pain. Intermittent heart palpitations. This is chronic.  Resp: Denies shortness of breath at rest or with exertion. Occasional cough. This is chronic.  GI: See HPI GU : Denies urinary burning, urinary frequency, urinary hesitancy MS: Chronic right hip pain. No NSAIDs.  Derm: Denies rash Psych: Denies depression or anxiety Heme: Bruises easily.   Physical Exam: BP 132/70   Pulse 84   Temp (!) 96.9 F (36.1 C) (Temporal)   Ht 5\' 5"  (1.651 m)   Wt 150 lb 9.6 oz (68.3 kg)   BMI 25.06 kg/m  General:   Alert and oriented. Pleasant and cooperative. Well-nourished and well-developed.  She is weak.  Requires some assistance with getting up on exam table.   Head:  Normocephalic and atraumatic. Eyes:  Without icterus, sclera clear and conjunctiva pink.  Ears:  Normal auditory acuity. Lungs:  Clear to auscultation bilaterally. No wheezes, rales, or rhonchi. No distress.  Heart:  S1, S2 present without murmurs appreciated.  Abdomen:  +BS, soft, non-tender and non-distended. No HSM noted. No guarding or rebound. No masses appreciated.  Rectal:  Deferred  Msk:  Symmetrical without gross deformities. Normal posture. Extremities:  Without edema. Neurologic:  Alert and  oriented x4;  grossly normal neurologically. Skin:  Intact without significant lesions or rashes. Psych: Normal mood and affect.

## 2019-11-30 ENCOUNTER — Other Ambulatory Visit: Payer: Self-pay

## 2019-11-30 ENCOUNTER — Telehealth: Payer: Self-pay

## 2019-11-30 ENCOUNTER — Encounter: Payer: Self-pay | Admitting: Gastroenterology

## 2019-11-30 ENCOUNTER — Ambulatory Visit (INDEPENDENT_AMBULATORY_CARE_PROVIDER_SITE_OTHER): Payer: Medicare Other | Admitting: Gastroenterology

## 2019-11-30 VITALS — BP 132/70 | HR 84 | Temp 96.9°F | Ht 65.0 in | Wt 150.6 lb

## 2019-11-30 DIAGNOSIS — K529 Noninfective gastroenteritis and colitis, unspecified: Secondary | ICD-10-CM

## 2019-11-30 DIAGNOSIS — R531 Weakness: Secondary | ICD-10-CM

## 2019-11-30 DIAGNOSIS — R112 Nausea with vomiting, unspecified: Secondary | ICD-10-CM | POA: Diagnosis not present

## 2019-11-30 DIAGNOSIS — R1110 Vomiting, unspecified: Secondary | ICD-10-CM | POA: Insufficient documentation

## 2019-11-30 MED ORDER — ONDANSETRON HCL 4 MG PO TABS: 4.0000 mg | ORAL_TABLET | Freq: Three times a day (TID) | ORAL | 1 refills | Status: DC | PRN

## 2019-11-30 NOTE — Patient Instructions (Addendum)
Please have labs and stool studies completed.  I will call you with results and further recommendations.  You can continue taking the antibiotics you are on currently until you have completed the course.  I am sending in Zofran to help with your nausea.  You may take this every 8 hours as needed.  Follow a bland diet for now.  Be sure you are drinking plenty of fluids to keep your urine pale yellow to clear.  Water, Pedialyte, Gatorade are good choices.  Should you have significant abdominal pain, lightheadedness, dizziness, feeling like you will pass out, you should proceed to the emergency room.  We will need to get you scheduled for a colonoscopy soon but will determine the timing after stool studies are completed.  We will see you back in 4 weeks.  Call if you have questions or concerns prior.  Aliene Altes, PA-C [facility]

## 2019-11-30 NOTE — Telephone Encounter (Signed)
FYI Spoke with Salma at [employer] [employer]. Pt's spouse went to pickup stool containers for stool testing per pts ov. Pt wasn't able to get out of the car due to it being to much for the spouse to have her labs drawn. Pt's spouse is going to try and get pt out of the car when stool studies are submitted or pt will have to go to the hospital, where they can help pt get out of the car.

## 2019-11-30 NOTE — Assessment & Plan Note (Addendum)
79 year old female with 3-4-week history of frequent watery diarrhea diagnosed with colitis via CT scan on 11/27/2019 and while in the ED.  CT with colonic wall thickening greatest at the sigmoid colon. Labs remarkable for hemoglobin 11.8  (L), potassium 3.4 (L), creatinine 1.68 (H).  PCP had already placed patient on Flagyl and ED added ciprofloxacin 500 mg twice daily x7 days.  She denies any improvement in diarrhea thus far.  Upwards of 5-10 stools a day.  Nocturnal symptoms.  Associated decrease in appetite, nausea, and vomiting with Flagyl.  Also with weight loss, weakness, occasional lightheadedness/dizziness, no presyncope or syncope.  Drinking plenty of fluids, urine usually pale yellow to clear.  Denies bright red blood per rectum, melena, or lower abdominal pain.  Occasional upper abdominal pain once a week or less.  Denies sick contacts, recent antibiotics, hospitalizations, or contact with livestock prior to onset of diarrhea.  She does drink well water.  Last colonoscopy in 2005 with external hemorrhoids, few left-sided diverticula, otherwise normal.  She was due for repeat in 2015. No family history of IBD.  I suspect acute colitis is likely infectious in etiology. As she has not had any improvement in symptoms with current antibiotic regimen, will complete stool studies including C. difficile GDH toxin A and B as well as GI pathogen panel.  We will also update CMP to evaluate for any worsening of kidney function or electrolyte abnormalities.  She is advised to continue her current antibiotic regimen for now, follow a bland diet, drink enough fluids to keep urine pale yellow to clear, Zofran 4 mg every 8 hours as needed for nausea.  She would need a colonoscopy in the near future.  Will await stool studies.  Follow-up in 4 weeks.  Advised if she were to have significant abdominal pain, lightheadedness, dizziness, feeling like she will pass out, or worsening weakness, she should proceed to the  emergency room.  Addendum:  C. difficile and GI pathogen panel negative.  She was started on Colestid twice daily and advised to use Imodium as needed for diarrhea.  Spoke with patient on 12/17/2019.  She has had minimal improvement in diarrhea.  Currently with frequent watery stools about every other day.  She is taking Colestid twice daily and Imodium as needed.  When taking Imodium, this does generally improve her diarrhea symptoms although she notes 1 day last week where she had 10 BMs in 24 hours.  At this point, we will proceed with colonoscopy with Dr. Gala Romney to further evaluate colitis documented on CT, ongoing diarrhea, and anemia.  Considered adding EGD as well but due to scheduling of double procedures out to late June or July, we will proceed with TCS first.

## 2019-11-30 NOTE — Telephone Encounter (Signed)
I would recommend she go to the hospital to have the labs completed. With her significant weakness, diarrhea, I need to be sure her electrolytes are not significantly abnormal.

## 2019-12-03 NOTE — Telephone Encounter (Signed)
Noted. Spouse is going to take pt to the hospital.

## 2019-12-04 ENCOUNTER — Other Ambulatory Visit: Payer: Self-pay

## 2019-12-04 ENCOUNTER — Other Ambulatory Visit: Payer: Self-pay | Admitting: Gastroenterology

## 2019-12-04 DIAGNOSIS — Z79899 Other long term (current) drug therapy: Secondary | ICD-10-CM

## 2019-12-04 DIAGNOSIS — E876 Hypokalemia: Secondary | ICD-10-CM

## 2019-12-04 LAB — COMPLETE METABOLIC PANEL WITH GFR
AG Ratio: 1.8 (calc) (ref 1.0–2.5)
ALT: 14 U/L (ref 6–29)
AST: 22 U/L (ref 10–35)
Albumin: 3.8 g/dL (ref 3.6–5.1)
Alkaline phosphatase (APISO): 88 U/L (ref 37–153)
BUN/Creatinine Ratio: 8 (calc) (ref 6–22)
BUN: 10 mg/dL (ref 7–25)
CO2: 28 mmol/L (ref 20–32)
Calcium: 9.4 mg/dL (ref 8.6–10.4)
Chloride: 101 mmol/L (ref 98–110)
Creat: 1.26 mg/dL — ABNORMAL HIGH (ref 0.60–0.93)
GFR, Est African American: 47 mL/min/{1.73_m2} — ABNORMAL LOW (ref >=60)
GFR, Est Non African American: 41 mL/min/{1.73_m2} — ABNORMAL LOW (ref >=60)
Globulin: 2.1 g/dL (calc) (ref 1.9–3.7)
Glucose, Bld: 144 mg/dL — ABNORMAL HIGH (ref 65–139)
Potassium: 3 mmol/L — ABNORMAL LOW (ref 3.5–5.3)
Sodium: 138 mmol/L (ref 135–146)
Total Bilirubin: 0.3 mg/dL (ref 0.2–1.2)
Total Protein: 5.9 g/dL — ABNORMAL LOW (ref 6.1–8.1)

## 2019-12-04 MED ORDER — POTASSIUM CHLORIDE ER 10 MEQ PO TBCR: EXTENDED_RELEASE_TABLET | ORAL | 0 refills | Status: DC

## 2019-12-04 NOTE — Progress Notes (Signed)
Patient called back. Discussed results. Diarrhea is about the same. No improvement. Eating somewhat better. Nausea and vomiting resolved. No abdominal pain. She is aware to pick up potassium from pharmacy and that Elmo Putt will arrange BMP for Friday. States she can have labs completed at [facility] lab.   Alicia: Please arrange BMP for 12/07/19

## 2019-12-04 NOTE — Progress Notes (Signed)
Kidney function still elevated but is improving. Sodium within normal limits. Potassium is low at 3.0. Suspect this is secondary to GI loses.   Tried to call patient to discuss results. She needs supplemental potassium which I can sent to her pharmacy. Recommend Potassium 40 meq daily (starting today) for the next 3 days and repeat BMP on Friday.  Waiting on GI pathogen panel and C. Diff to result.   [name], can you try calling patient again and relay results and recommendations? How is she doing? (Diarrhea/nausea with vomiting)

## 2019-12-05 LAB — GASTROINTESTINAL PATHOGEN PANEL PCR
C. difficile Tox A/B, PCR: NOT DETECTED
Campylobacter, PCR: NOT DETECTED
Cryptosporidium, PCR: NOT DETECTED
E coli (ETEC) LT/ST PCR: NOT DETECTED
E coli (STEC) stx1/stx2, PCR: NOT DETECTED
E coli 0157, PCR: NOT DETECTED
Giardia lamblia, PCR: NOT DETECTED
Norovirus, PCR: NOT DETECTED
Rotavirus A, PCR: NOT DETECTED
Salmonella, PCR: NOT DETECTED
Shigella, PCR: NOT DETECTED

## 2019-12-05 LAB — C. DIFFICILE GDH AND TOXIN A/B
GDH ANTIGEN: NOT DETECTED
MICRO NUMBER:: 10257912
SPECIMEN QUALITY:: ADEQUATE
TOXIN A AND B: NOT DETECTED

## 2019-12-06 NOTE — Progress Notes (Signed)
C. Diff negative. No findings on GI pathogen panel to suggest infectious diarrhea. She should be close to finishing her antibiotics if she hasn't already.   If she is still having significant diarrhea, I would like to try her on colestid 2 g daily. Oral medications should be take at least 1 hour before or 4 hours after colestid. She can continue imodium as needed. Take 2 tablets x1 then 1 tablet after each loose stool. Max 4 tablets/24 hours.   We are going to need to pursue procedures (TCS/possible EGD) in the near future. Would like to see how her diarrhea does over the weekend before making the decision on procedures/timing. It is possible her colitis was infectious and was not picked up by stool studies as she had already been on antibiotics. Ongoing diarrhea could be related to the antibiotics. We need to follow up with her at the beginning of next week.

## 2019-12-07 ENCOUNTER — Other Ambulatory Visit: Payer: Self-pay | Admitting: Gastroenterology

## 2019-12-07 DIAGNOSIS — R197 Diarrhea, unspecified: Secondary | ICD-10-CM

## 2019-12-07 MED ORDER — COLESTIPOL HCL 1 G PO TABS: ORAL_TABLET | ORAL | 1 refills | Status: DC

## 2019-12-08 LAB — BASIC METABOLIC PANEL
BUN/Creatinine Ratio: 7 (calc) (ref 6–22)
BUN: 7 mg/dL (ref 7–25)
CO2: 26 mmol/L (ref 20–32)
Calcium: 9.6 mg/dL (ref 8.6–10.4)
Chloride: 107 mmol/L (ref 98–110)
Creat: 0.95 mg/dL — ABNORMAL HIGH (ref 0.60–0.93)
Glucose, Bld: 149 mg/dL — ABNORMAL HIGH (ref 65–139)
Potassium: 4 mmol/L (ref 3.5–5.3)
Sodium: 141 mmol/L (ref 135–146)

## 2019-12-17 ENCOUNTER — Other Ambulatory Visit: Payer: Self-pay

## 2019-12-17 ENCOUNTER — Telehealth: Payer: Self-pay | Admitting: Internal Medicine

## 2019-12-17 ENCOUNTER — Other Ambulatory Visit: Payer: Self-pay | Admitting: Gastroenterology

## 2019-12-17 DIAGNOSIS — R197 Diarrhea, unspecified: Secondary | ICD-10-CM

## 2019-12-17 DIAGNOSIS — Z79899 Other long term (current) drug therapy: Secondary | ICD-10-CM

## 2019-12-17 MED ORDER — DICYCLOMINE HCL 10 MG PO CAPS: 10.0000 mg | ORAL_CAPSULE | Freq: Two times a day (BID) | ORAL | 1 refills | Status: DC

## 2019-12-17 NOTE — Telephone Encounter (Signed)
[facility] can call since I have not seen patient recently.

## 2019-12-17 NOTE — Telephone Encounter (Signed)
Noted  

## 2019-12-17 NOTE — Telephone Encounter (Signed)
Pt's son Lucillia Ragasa) wants to speak with RMR or [place] about his mother's case. He is calling from [place] and they are 6 hours ahead of [place]. He said to call either early morning or in the afternoon (our time). [phone]

## 2019-12-17 NOTE — Telephone Encounter (Signed)
Spoke with patient's son.  He is very concerned about his mom considering her ongoing significant, persistent diarrhea.  He queries again about whether or not she can discontinue her statin medication.  He did have her hold it for 2 days last week and she had some improvement in diarrhea.  His mom was concerned about possible side effects of not taking the statin and resumed it.  He reports she had another flare after resuming the statin.  He has reached out to her PCP but has not heard anything back.  States his mom is not eating very much due to concerns of diarrhea postprandially.  States she has ongoing weakness and has lost 18 pounds since all this started at the beginning of the year.  I am going to try to reach out to patient's cardiologist to determine safety of statin holiday to determine if this is influencing her diarrhea. We will also try to see about having procedure moved up sooner.   Again spoke with patient. States she has eaten four crackers and two boiled eggs today.  States she is not eating much due to not wanting to have to go to the bathroom.  Denies lightheadedness, dizziness, presyncope, or syncope.  Feels her weakness has not worsened since I saw her in the office.  No nausea or vomiting. Discussed the importance with her of increasing her daily oral intake as this is contributing to her weakness.  She was advised to consume more bland foods such as toast, crackers, soups, lean meats that are baked, broiled, or boiled, bananas, applesauce.  Also discussed adding protein shakes with almond milk or other nondairy meal supplement.  Patient stated she would try to focus on increasing her dietary intake.  She was also advised to go ahead and take 1 Imodium every morning and then as needed if diarrhea resumes.  Elmo Putt I would like to update her BMP later this week due to ongoing diarrhea and history of hypokalemia. Also, when you call her, will you let her know I would like to start her on  Bentyl 10 mg up to twice daily. She can take it before breakfast and before dinner. Side effects include dizziness, dry mouth, difficulty urinating, or constipation.    Camille: This patient really needs her colonoscopy ASAP. Any way you can help get this moved up?

## 2019-12-17 NOTE — Telephone Encounter (Signed)
Spoke with pt. Pt will have BMP done this week. Lab order placed and released. Pt would like Bentyl sent into [facility].

## 2019-12-18 NOTE — Telephone Encounter (Signed)
Please add patient to cancellation list

## 2019-12-18 NOTE — Telephone Encounter (Signed)
Placed on cancellation list 

## 2019-12-19 ENCOUNTER — Telehealth: Payer: Self-pay | Admitting: Gastroenterology

## 2019-12-19 NOTE — Telephone Encounter (Signed)
[name], any way you can reach out to patient's PCP to get the ok to try a statin holiday? She feels the statin may be contributing to her diarrhea and doesn't know if it is safe to hold. I do not want to make the call as I do not prescribe the medication.   I had planned to discuss with cardiology but it looks like she hasn't been seen since 2019.

## 2019-12-20 NOTE — Telephone Encounter (Signed)
Please clarify note.

## 2019-12-20 NOTE — Telephone Encounter (Signed)
Called pts PCP office and left a message. Waiting on a return call.

## 2019-12-20 NOTE — Telephone Encounter (Signed)
Called PCP, not able to leave a VM due to voice message asking you not to leave one. PCP's office hours have changed due to Covid-19. Will call back.

## 2019-12-20 NOTE — Telephone Encounter (Signed)
Would PCP be okay with patient holding statin for 2-4 weeks to see if this is contributing to her diarrhea?

## 2019-12-22 LAB — BASIC METABOLIC PANEL
BUN/Creatinine Ratio: 11 (calc) (ref 6–22)
BUN: 11 mg/dL (ref 7–25)
CO2: 31 mmol/L (ref 20–32)
Calcium: 10.1 mg/dL (ref 8.6–10.4)
Chloride: 98 mmol/L (ref 98–110)
Creat: 1.01 mg/dL — ABNORMAL HIGH (ref 0.60–0.93)
Glucose, Bld: 121 mg/dL (ref 65–139)
Potassium: 3.5 mmol/L (ref 3.5–5.3)
Sodium: 137 mmol/L (ref 135–146)

## 2019-12-25 NOTE — Telephone Encounter (Signed)
Noted  

## 2019-12-25 NOTE — Telephone Encounter (Signed)
Called pts PCP office and was told that it is ok for pt to hold statin medication x 2-4 weeks to see if it helps the diarrhea. Pt is aware that it's ok to hold Lipitor x 2-4 weeks to see if it approves her diarrhea.

## 2019-12-29 ENCOUNTER — Other Ambulatory Visit: Payer: Self-pay | Admitting: Gastroenterology

## 2019-12-29 DIAGNOSIS — R197 Diarrhea, unspecified: Secondary | ICD-10-CM

## 2019-12-30 NOTE — H&P (View-Only) (Signed)
Referring Provider: Lemmie Evens, MD Primary Care Physician:  Lemmie Evens, MD Primary GI Physician: Dr. Gala Romney  Chief Complaint  Patient presents with  . Diarrhea    f/u.  Marland Kitchen Nausea    much improved    HPI:   [name] is a 79 y.o. female presenting today for follow-up of colitis.   Last colonoscopy 06/11/2004 with external hemorrhoids, few scattered left-sided diverticula, otherwise normal.  Repeat colonoscopy in 10 years  Patient was last seen in our office on 11/30/2019 for initial consult of colitis.  She had recently been seen in the ED on 11/27/2019 for diarrhea with labs remarkable for hemoglobin 11.8, potassium 3.4, creatinine 1.68.  CT abdomen and pelvis with suggestion of colonic wall thickening greatest at the sigmoid where there is also mesenteric stranding stated to be nonspecific colitis.  Ciprofloxacin 500 mg twice daily x7 days was added to Flagyl that had already been prescribed by her PCP.  At the time of her visit, diarrhea has been present for more than 3 weeks with about 10 stools a day.  Had completed 7 days of Flagyl and part of the course of ciprofloxacin without any improvement in diarrhea.  Associated nausea and vomiting with Flagyl.  Also with decreased appetite, weight loss, weakness, occasional light headedness/dizziness.  Denied bright red blood per rectum, melena, or abdominal pain.  No significant risk factors for infectious diarrhea although she does drink well water.  Plans to complete stool studies with C. difficile and GI allergen panel and update CMP.  She was to complete her antibiotic course, follow a bland diet, and provided Zofran to help with nausea.  CMP with kidney function still elevated with creatinine 1.36 but improving, potassium low at 3.0 likely secondary to GI losses.  Potassium was repleted.  Potassium was 4.0 on 3/19 and 3.5 on 4/2.   C. difficile and GI pathogen panel were negative.  I had started her on Colestid twice daily and  advised to use Imodium as needed for diarrhea.  After speaking with patient on 12/17/2019, she reported minimal improvement in diarrhea with watery stools every other day.  She did report when taking Imodium, it did help her symptoms.  Ultimately, I recommended patient to proceed with colonoscopy ASAP for further evaluation of colitis, ongoing diarrhea, and anemia which is now currently scheduled for 01/23/2020. Bentyl 10 mg up to twice daily was also added to her regimen to see if this would help improve her symptoms while waiting for colonoscopy.  Side effects were discussed with patient.  Also spoke with patient's son on 3/29 who was concerned that her statin medication may be contributing to her ongoing diarrhea.  States they held it for 2 days and diarrhea improved but then returned after she restarted the medication.  Our office reached out to her PCP to get the okay for a statin holiday for 2-4 weeks which was approved and patient was made aware on 12/25/2019.    Today: One day she will go without diarrhea then the will have days she can't get out of the bathroom. On her good days, she will have 2 and no more than 3 BMs. Stools are always runny. On her bad days, she is having 5-6 BMs. Nocturnal stools. Only taking imodium after 4-5 BMs. Will help if she takes it. Taking colestid BID and Bentyl twice a day. Hasn't noticed much of a difference with bentyl. No blood in the stool. No black stool. No nausea or vomiting.  No regular abdominal pain. Occasional lower abdominal prior to a BM. Trying to eat a little better. Doesn't have much of an appetite. This all started when her diarrhea started. No GERD symptoms. Gets full quickly. Drinking protein shakes, but only 1/2 a day. Feels her energy is improving some. No lightheadedness, dizziness, presyncope or syncope. Doesn't think eating is worsening diarrhea although the more she eats the more she has to go later in the day.   Stopped taking statin about 1 week  ago. No significant change in diarrhea.   No dairy right now. Started Restore last Saturday.     Weight down 5 pounds in the last 4 weeks.   Past Medical History:  Diagnosis Date  . Arthritis   . Carotid artery disease ([place])   . COPD (chronic obstructive pulmonary disease) ([place])   . Dysrhythmia    palpitations  . GERD (gastroesophageal reflux disease)   . Hypercholesteremia   . Hypertension   . Hypothyroidism   . Shortness of breath     Past Surgical History:  Procedure Laterality Date  . ABDOMINAL HYSTERECTOMY    . APPENDECTOMY    . BREAST BIOPSY Left 05/04/2017   Procedure: BREAST BIOPSY WITH NEEDLE LOCALIZATION;  Surgeon: Aviva Signs, MD;  Location: [facility];  Service: General;  Laterality: Left;  needle loc at 8:00  . CATARACT EXTRACTION W/PHACO Left 09/30/2014   Procedure: CATARACT EXTRACTION PHACO AND INTRAOCULAR LENS PLACEMENT LEFT EYE;  Surgeon: Tonny Branch, MD;  Location: [facility];  Service: Ophthalmology;  Laterality: Left;  CDE:8.79  . CATARACT EXTRACTION W/PHACO Right 10/17/2014   Procedure: CATARACT EXTRACTION PHACO AND INTRAOCULAR LENS PLACEMENT RIGHT EYE;  Surgeon: Tonny Branch, MD;  Location: [facility];  Service: Ophthalmology;  Laterality: Right;  CDE: 7.80  . CHOLECYSTECTOMY N/A 04/30/2013   Procedure: Attempted LAPAROSCOPIC CHOLECYSTECTOMY ; converted to open procedure @ 1510;  Surgeon: Scherry Ran, MD;  Location: [facility];  Service: General;  Laterality: N/A;  . CHOLECYSTECTOMY N/A 04/30/2013   Procedure: CHOLECYSTECTOMY;  Surgeon: Scherry Ran, MD;  Location: [facility];  Service: General;  Laterality: N/A;  procedure started @ 1520  . COLONOSCOPY  06/11/2004   External hemorrhoids, few scattered left-sided diverticula, otherwise normal.  Repeat colonoscopy in 10 years.  . ESOPHAGOGASTRODUODENOSCOPY  06/11/2004   Distal esophageal erosions consistent with mild erosive reflux esophagitis, heaped up adenomatous appearing prepyloric antral mucosa biopsy, single  antral prepyloric ulcer biopsied, normal D1 and D2.  . ESOPHAGOGASTRODUODENOSCOPY  10/07/2004   Distal esophageal erosions consistent with erosive reflux esophagitis, otherwise normal esophagus, normal stomach, previously noted ulcer healed completely, normal D1 and D2.  Marland Kitchen LAMINECTOMY  12/26/2017   spine surgery Dr Carloyn Manner  . TUBAL LIGATION      Current Outpatient Medications  Medication Sig Dispense Refill  . acyclovir (ZOVIRAX) 200 MG capsule Take 200 mg by mouth daily as needed (infection).     Marland Kitchen albuterol (PROVENTIL HFA;VENTOLIN HFA) 108 (90 BASE) MCG/ACT inhaler Inhale 2 puffs into the lungs every 6 (six) hours as needed for wheezing or shortness of breath.    . ALPRAZolam (XANAX) 0.5 MG tablet Take 0.25 tablets by mouth at bedtime as needed for sleep.     Marland Kitchen aspirin EC 81 MG tablet Take 81 mg by mouth 2 (two) times a week. Wednesday and sundays    . benazepril (LOTENSIN) 10 MG tablet Take 10 mg by mouth at bedtime.     . Cholecalciferol (VITAMIN D-3) 5000 units TABS Take 2,000 Units  by mouth daily.     . colestipol (COLESTID) 1 g tablet TAKE 2 TABLETS (2 G TOTAL) BY MOUTH DAILY. TAKE OTHER MEDS AT LEAST 1 HOUR BEFORE OR 4 HOURS AFTER 60 tablet 3  . dicyclomine (BENTYL) 10 MG capsule Take 1 capsule (10 mg total) by mouth 2 (two) times daily before a meal. 60 capsule 1  . hydrochlorothiazide (MICROZIDE) 12.5 MG capsule Take 1 capsule by mouth daily.    Marland Kitchen levothyroxine (SYNTHROID, LEVOTHROID) 88 MCG tablet Take 88 mcg by mouth daily before breakfast.    . loperamide (IMODIUM) 2 MG capsule Take 2-4 mg by mouth as needed for diarrhea or loose stools.    . Multiple Vitamins-Minerals (PRESERVISION AREDS 2 PO) Take by mouth.    Marland Kitchen omeprazole (PRILOSEC) 20 MG capsule Take 20 mg by mouth 2 (two) times a week. Wednesday and Sundays    . vitamin C (ASCORBIC ACID) 500 MG tablet Take 500 mg by mouth daily.    Marland Kitchen atorvastatin (LIPITOR) 40 MG tablet Take 40 mg by mouth daily at 6 PM.     . ondansetron  (ZOFRAN) 4 MG tablet Take 1 tablet (4 mg total) by mouth every 8 (eight) hours as needed for nausea or vomiting. (Patient not taking: Reported on 12/31/2019) 30 tablet 1   No current facility-administered medications for this visit.   Facility-Administered Medications Ordered in Other Visits  Medication Dose Route Frequency Provider Last Rate Last Admin  . fentaNYL (SUBLIMAZE) injection 25-50 mcg  25-50 mcg Intravenous Q5 min PRN Lerry Liner, MD        Allergies as of 12/31/2019 - Review Complete 12/31/2019  Allergen Reaction Noted  . Codeine Nausea And Vomiting 04/26/2013    Family History  Problem Relation Age of Onset  . Heart failure Mother   . Cancer Father   . Colon cancer Neg Hx   . Inflammatory bowel disease Neg Hx     Social History   Socioeconomic History  . Marital status: Married    Spouse name: Not on file  . Number of children: Not on file  . Years of education: Not on file  . Highest education level: Not on file  Occupational History  . Not on file  Tobacco Use  . Smoking status: Current Every Day Smoker    Packs/day: 0.25    Years: 55.00    Pack years: 13.75    Types: Cigarettes  . Smokeless tobacco: Never Used  . Tobacco comment: 4-5 cigs/day  Substance and Sexual Activity  . Alcohol use: No  . Drug use: No  . Sexual activity: Yes    Birth control/protection: Surgical  Other Topics Concern  . Not on file  Social History Narrative  . Not on file   Social Determinants of Health   Financial Resource Strain:   . Difficulty of Paying Living Expenses:   Food Insecurity:   . Worried About Charity fundraiser in the Last Year:   . Arboriculturist in the Last Year:   Transportation Needs:   . Film/video editor (Medical):   Marland Kitchen Lack of Transportation (Non-Medical):   Physical Activity:   . Days of Exercise per Week:   . Minutes of Exercise per Session:   Stress:   . Feeling of Stress :   Social Connections:   . Frequency of Communication  with Friends and Family:   . Frequency of Social Gatherings with Friends and Family:   . Attends Religious Services:   .  Active Member of Clubs or Organizations:   . Attends Archivist Meetings:   Marland Kitchen Marital Status:     Review of Systems: Gen: Denies fever, chills, cold or flulike symptoms. CV: Denies chest pain or heart palpitations. Resp: Denies dyspnea at rest or cough. GI: See HPI Derm: Denies rash Psych: Denies depression or anxiety Heme: See HPI  Physical Exam: BP (!) 150/67   Pulse 83   Temp (!) 97.1 F (36.2 C) (Oral)   Ht 5\' 5"  (1.651 m)   Wt 145 lb 9.6 oz (66 kg)   BMI 24.23 kg/m  General:   Alert and oriented. No distress noted. Pleasant and cooperative.  Able to get up on exam table independently which is improved from last visit.  Appears more steady on her feet. Head:  Normocephalic and atraumatic. Eyes:  Conjuctiva clear without scleral icterus. Heart:  S1, S2 present without murmurs appreciated. Lungs:  Clear to auscultation bilaterally. No wheezes, rales, or rhonchi. No distress.  Abdomen:  +BS, soft, non-tender and non-distended. No rebound or guarding. No HSM or masses noted. Msk:  Symmetrical without gross deformities. Normal posture. Extremities:  Without edema. Neurologic:  Alert and  oriented x4 Psych: Normal mood and affect.

## 2019-12-30 NOTE — Progress Notes (Signed)
Referring Provider: Lemmie Evens, MD Primary Care Physician:  Lemmie Evens, MD Primary GI Physician: Dr. Gala Romney  Chief Complaint  Patient presents with  . Diarrhea    f/u.  Marland Kitchen Nausea    much improved    HPI:   [name] is a 79 y.o. female presenting today for follow-up of colitis.   Last colonoscopy 06/11/2004 with external hemorrhoids, few scattered left-sided diverticula, otherwise normal.  Repeat colonoscopy in 10 years  Patient was last seen in our office on 11/30/2019 for initial consult of colitis.  She had recently been seen in the ED on 11/27/2019 for diarrhea with labs remarkable for hemoglobin 11.8, potassium 3.4, creatinine 1.68.  CT abdomen and pelvis with suggestion of colonic wall thickening greatest at the sigmoid where there is also mesenteric stranding stated to be nonspecific colitis.  Ciprofloxacin 500 mg twice daily x7 days was added to Flagyl that had already been prescribed by her PCP.  At the time of her visit, diarrhea has been present for more than 3 weeks with about 10 stools a day.  Had completed 7 days of Flagyl and part of the course of ciprofloxacin without any improvement in diarrhea.  Associated nausea and vomiting with Flagyl.  Also with decreased appetite, weight loss, weakness, occasional light headedness/dizziness.  Denied bright red blood per rectum, melena, or abdominal pain.  No significant risk factors for infectious diarrhea although she does drink well water.  Plans to complete stool studies with C. difficile and GI allergen panel and update CMP.  She was to complete her antibiotic course, follow a bland diet, and provided Zofran to help with nausea.  CMP with kidney function still elevated with creatinine 1.36 but improving, potassium low at 3.0 likely secondary to GI losses.  Potassium was repleted.  Potassium was 4.0 on 3/19 and 3.5 on 4/2.   C. difficile and GI pathogen panel were negative.  I had started her on Colestid twice daily and  advised to use Imodium as needed for diarrhea.  After speaking with patient on 12/17/2019, she reported minimal improvement in diarrhea with watery stools every other day.  She did report when taking Imodium, it did help her symptoms.  Ultimately, I recommended patient to proceed with colonoscopy ASAP for further evaluation of colitis, ongoing diarrhea, and anemia which is now currently scheduled for 01/23/2020. Bentyl 10 mg up to twice daily was also added to her regimen to see if this would help improve her symptoms while waiting for colonoscopy.  Side effects were discussed with patient.  Also spoke with patient's son on 3/29 who was concerned that her statin medication may be contributing to her ongoing diarrhea.  States they held it for 2 days and diarrhea improved but then returned after she restarted the medication.  Our office reached out to her PCP to get the okay for a statin holiday for 2-4 weeks which was approved and patient was made aware on 12/25/2019.    Today: One day she will go without diarrhea then the will have days she can't get out of the bathroom. On her good days, she will have 2 and no more than 3 BMs. Stools are always runny. On her bad days, she is having 5-6 BMs. Nocturnal stools. Only taking imodium after 4-5 BMs. Will help if she takes it. Taking colestid BID and Bentyl twice a day. Hasn't noticed much of a difference with bentyl. No blood in the stool. No black stool. No nausea or vomiting.  No regular abdominal pain. Occasional lower abdominal prior to a BM. Trying to eat a little better. Doesn't have much of an appetite. This all started when her diarrhea started. No GERD symptoms. Gets full quickly. Drinking protein shakes, but only 1/2 a day. Feels her energy is improving some. No lightheadedness, dizziness, presyncope or syncope. Doesn't think eating is worsening diarrhea although the more she eats the more she has to go later in the day.   Stopped taking statin about 1 week  ago. No significant change in diarrhea.   No dairy right now. Started Restore last Saturday.     Weight down 5 pounds in the last 4 weeks.   Past Medical History:  Diagnosis Date  . Arthritis   . Carotid artery disease ([place])   . COPD (chronic obstructive pulmonary disease) ([place])   . Dysrhythmia    palpitations  . GERD (gastroesophageal reflux disease)   . Hypercholesteremia   . Hypertension   . Hypothyroidism   . Shortness of breath     Past Surgical History:  Procedure Laterality Date  . ABDOMINAL HYSTERECTOMY    . APPENDECTOMY    . BREAST BIOPSY Left 05/04/2017   Procedure: BREAST BIOPSY WITH NEEDLE LOCALIZATION;  Surgeon: Aviva Signs, MD;  Location: [facility];  Service: General;  Laterality: Left;  needle loc at 8:00  . CATARACT EXTRACTION W/PHACO Left 09/30/2014   Procedure: CATARACT EXTRACTION PHACO AND INTRAOCULAR LENS PLACEMENT LEFT EYE;  Surgeon: Tonny Branch, MD;  Location: [facility];  Service: Ophthalmology;  Laterality: Left;  CDE:8.79  . CATARACT EXTRACTION W/PHACO Right 10/17/2014   Procedure: CATARACT EXTRACTION PHACO AND INTRAOCULAR LENS PLACEMENT RIGHT EYE;  Surgeon: Tonny Branch, MD;  Location: [facility];  Service: Ophthalmology;  Laterality: Right;  CDE: 7.80  . CHOLECYSTECTOMY N/A 04/30/2013   Procedure: Attempted LAPAROSCOPIC CHOLECYSTECTOMY ; converted to open procedure @ 1510;  Surgeon: Scherry Ran, MD;  Location: [facility];  Service: General;  Laterality: N/A;  . CHOLECYSTECTOMY N/A 04/30/2013   Procedure: CHOLECYSTECTOMY;  Surgeon: Scherry Ran, MD;  Location: [facility];  Service: General;  Laterality: N/A;  procedure started @ 1520  . COLONOSCOPY  06/11/2004   External hemorrhoids, few scattered left-sided diverticula, otherwise normal.  Repeat colonoscopy in 10 years.  . ESOPHAGOGASTRODUODENOSCOPY  06/11/2004   Distal esophageal erosions consistent with mild erosive reflux esophagitis, heaped up adenomatous appearing prepyloric antral mucosa biopsy, single  antral prepyloric ulcer biopsied, normal D1 and D2.  . ESOPHAGOGASTRODUODENOSCOPY  10/07/2004   Distal esophageal erosions consistent with erosive reflux esophagitis, otherwise normal esophagus, normal stomach, previously noted ulcer healed completely, normal D1 and D2.  Marland Kitchen LAMINECTOMY  12/26/2017   spine surgery Dr Carloyn Manner  . TUBAL LIGATION      Current Outpatient Medications  Medication Sig Dispense Refill  . acyclovir (ZOVIRAX) 200 MG capsule Take 200 mg by mouth daily as needed (infection).     Marland Kitchen albuterol (PROVENTIL HFA;VENTOLIN HFA) 108 (90 BASE) MCG/ACT inhaler Inhale 2 puffs into the lungs every 6 (six) hours as needed for wheezing or shortness of breath.    . ALPRAZolam (XANAX) 0.5 MG tablet Take 0.25 tablets by mouth at bedtime as needed for sleep.     Marland Kitchen aspirin EC 81 MG tablet Take 81 mg by mouth 2 (two) times a week. Wednesday and sundays    . benazepril (LOTENSIN) 10 MG tablet Take 10 mg by mouth at bedtime.     . Cholecalciferol (VITAMIN D-3) 5000 units TABS Take 2,000 Units  by mouth daily.     . colestipol (COLESTID) 1 g tablet TAKE 2 TABLETS (2 G TOTAL) BY MOUTH DAILY. TAKE OTHER MEDS AT LEAST 1 HOUR BEFORE OR 4 HOURS AFTER 60 tablet 3  . dicyclomine (BENTYL) 10 MG capsule Take 1 capsule (10 mg total) by mouth 2 (two) times daily before a meal. 60 capsule 1  . hydrochlorothiazide (MICROZIDE) 12.5 MG capsule Take 1 capsule by mouth daily.    Marland Kitchen levothyroxine (SYNTHROID, LEVOTHROID) 88 MCG tablet Take 88 mcg by mouth daily before breakfast.    . loperamide (IMODIUM) 2 MG capsule Take 2-4 mg by mouth as needed for diarrhea or loose stools.    . Multiple Vitamins-Minerals (PRESERVISION AREDS 2 PO) Take by mouth.    Marland Kitchen omeprazole (PRILOSEC) 20 MG capsule Take 20 mg by mouth 2 (two) times a week. Wednesday and Sundays    . vitamin C (ASCORBIC ACID) 500 MG tablet Take 500 mg by mouth daily.    Marland Kitchen atorvastatin (LIPITOR) 40 MG tablet Take 40 mg by mouth daily at 6 PM.     . ondansetron  (ZOFRAN) 4 MG tablet Take 1 tablet (4 mg total) by mouth every 8 (eight) hours as needed for nausea or vomiting. (Patient not taking: Reported on 12/31/2019) 30 tablet 1   No current facility-administered medications for this visit.   Facility-Administered Medications Ordered in Other Visits  Medication Dose Route Frequency Provider Last Rate Last Admin  . fentaNYL (SUBLIMAZE) injection 25-50 mcg  25-50 mcg Intravenous Q5 min PRN Lerry Liner, MD        Allergies as of 12/31/2019 - Review Complete 12/31/2019  Allergen Reaction Noted  . Codeine Nausea And Vomiting 04/26/2013    Family History  Problem Relation Age of Onset  . Heart failure Mother   . Cancer Father   . Colon cancer Neg Hx   . Inflammatory bowel disease Neg Hx     Social History   Socioeconomic History  . Marital status: Married    Spouse name: Not on file  . Number of children: Not on file  . Years of education: Not on file  . Highest education level: Not on file  Occupational History  . Not on file  Tobacco Use  . Smoking status: Current Every Day Smoker    Packs/day: 0.25    Years: 55.00    Pack years: 13.75    Types: Cigarettes  . Smokeless tobacco: Never Used  . Tobacco comment: 4-5 cigs/day  Substance and Sexual Activity  . Alcohol use: No  . Drug use: No  . Sexual activity: Yes    Birth control/protection: Surgical  Other Topics Concern  . Not on file  Social History Narrative  . Not on file   Social Determinants of Health   Financial Resource Strain:   . Difficulty of Paying Living Expenses:   Food Insecurity:   . Worried About Charity fundraiser in the Last Year:   . Arboriculturist in the Last Year:   Transportation Needs:   . Film/video editor (Medical):   Marland Kitchen Lack of Transportation (Non-Medical):   Physical Activity:   . Days of Exercise per Week:   . Minutes of Exercise per Session:   Stress:   . Feeling of Stress :   Social Connections:   . Frequency of Communication  with Friends and Family:   . Frequency of Social Gatherings with Friends and Family:   . Attends Religious Services:   .  Active Member of Clubs or Organizations:   . Attends Archivist Meetings:   Marland Kitchen Marital Status:     Review of Systems: Gen: Denies fever, chills, cold or flulike symptoms. CV: Denies chest pain or heart palpitations. Resp: Denies dyspnea at rest or cough. GI: See HPI Derm: Denies rash Psych: Denies depression or anxiety Heme: See HPI  Physical Exam: BP (!) 150/67   Pulse 83   Temp (!) 97.1 F (36.2 C) (Oral)   Ht 5\' 5"  (1.651 m)   Wt 145 lb 9.6 oz (66 kg)   BMI 24.23 kg/m  General:   Alert and oriented. No distress noted. Pleasant and cooperative.  Able to get up on exam table independently which is improved from last visit.  Appears more steady on her feet. Head:  Normocephalic and atraumatic. Eyes:  Conjuctiva clear without scleral icterus. Heart:  S1, S2 present without murmurs appreciated. Lungs:  Clear to auscultation bilaterally. No wheezes, rales, or rhonchi. No distress.  Abdomen:  +BS, soft, non-tender and non-distended. No rebound or guarding. No HSM or masses noted. Msk:  Symmetrical without gross deformities. Normal posture. Extremities:  Without edema. Neurologic:  Alert and  oriented x4 Psych: Normal mood and affect.

## 2019-12-31 ENCOUNTER — Other Ambulatory Visit: Payer: Self-pay

## 2019-12-31 ENCOUNTER — Encounter: Payer: Self-pay | Admitting: Gastroenterology

## 2019-12-31 ENCOUNTER — Ambulatory Visit (INDEPENDENT_AMBULATORY_CARE_PROVIDER_SITE_OTHER): Payer: Medicare Other | Admitting: Gastroenterology

## 2019-12-31 VITALS — BP 150/67 | HR 83 | Temp 97.1°F | Ht 65.0 in | Wt 145.6 lb

## 2019-12-31 DIAGNOSIS — D649 Anemia, unspecified: Secondary | ICD-10-CM | POA: Insufficient documentation

## 2019-12-31 DIAGNOSIS — K529 Noninfective gastroenteritis and colitis, unspecified: Secondary | ICD-10-CM | POA: Diagnosis not present

## 2019-12-31 DIAGNOSIS — R197 Diarrhea, unspecified: Secondary | ICD-10-CM

## 2019-12-31 DIAGNOSIS — D509 Iron deficiency anemia, unspecified: Secondary | ICD-10-CM | POA: Diagnosis not present

## 2019-12-31 NOTE — Assessment & Plan Note (Addendum)
79 year old female with history of acute colitis diagnosed via CT abdomen pelvis on 11/27/2019.  Findings included suggestion of colonic wall thickening greatest at the sigmoid where there was also mesenteric stranding stated to be nonspecific colitis.  She had already been placed on Flagyl by her PCP and ciprofloxacin was added to her antibiotic regimen.  At her last visit on 11/30/2019, patient was having about 10 stools a day with minimal improvement with antibiotics.  Associated nausea and vomiting with Flagyl.  C. difficile and GI pathogen panel were completed and negative.  She was started on Colestid twice daily and advised to use Imodium as needed.  Again reported minimal improvement in symptoms.  She was prescribed Bentyl 10 mg twice daily and recommended colonoscopy ASAP which is currently scheduled for 01/23/2020.  Also at patient and patient's son request, reached out to PCP to get the okay for a statin holiday.  Patient has currently been off of her statin for 1 week.  Diarrhea continues but has improved somewhat. Does not feel Bentyl has helped significantly.  Has good days with 2-3 watery BMs and bad days with 5-6 watery BMs.  Also with nocturnal stools.  Continues with Bentyl, Colestid, and only taking Imodium after 4-5 watery stools. Denies bright red blood per rectum, melena, nausea, vomiting, or abdominal pain.  She does have decreased appetite which started at the same time of diarrhea.  Weight loss of 5 pounds in the last 4 weeks. Interestingly, prior CBC in March with hemoglobin 11.8 (L) and iron panel in Feb 2020 with ferritin 15 (L) and saturation 13% (L). Last colonoscopy 06/11/2004 with external hemorrhoids, few scattered left-sided diverticula, otherwise normal.  Repeat colonoscopy in 10 years  Patient needs colonoscopy for further evaluation of colitis/ongoing diarrhea as well as IDA.  She may have had infectious colitis now with postinfectious IBS versus microscopic colitis versus IBD.  We  will also evaluate for thyroid abnormalities and celiac disease. As Bentyl has not provided any significant improvement, she was advised to stop this and use Imodium as primary controller of diarrhea.  Proceed with colonoscopy as scheduled with Dr. Gala Romney. Continue Colestid for now. Stop Bentyl. Start Imodium 1 capsule every morning then as needed with a maximum of 4 tablets/24 hours. Continue Restore.  Continue following a lactose-free and low-fat diet. Bland foods such as toast, crackers, soups, lean meats that are baked, broiled, or boiled, bananas, applesauce.  Advance diet as tolerated. Protein shakes daily to help with adequate nutritional intake. Update BMP if she has had low potassium historically secondary to diarrhea. Update TSH and check IgA and TTGIgA.  Follow-up in 2 months.

## 2019-12-31 NOTE — Patient Instructions (Addendum)
Proceed with colonoscopy as scheduled.  Please have labs completed. We will call you with results.   Stop bentyl for now.   Take 1 imodium every morning and then as needed if your loose stools continue. No more than 4 imodium in 24 hours.   Continue to avoid all dairy products.   Follow a low fat diet.   Continue with bland foods such as toast, crackers, soups, lean meats that are baked, broiled, or boiled, bananas, applesauce. Advance your diet as tolerated.   Add proetin shakes daily.   You may go ahead and start daily iron. Be sure the iron you pick up over the counter has 325 mg ferrous sulfate.   Be sure to hold iron for 7 days prior to our colonoscopy.   We will follow-up with you in 2 months. Call with questions or concerns prior.   Aliene Altes, PA-C [facility]

## 2019-12-31 NOTE — Assessment & Plan Note (Signed)
Addressed under diarrhea 

## 2020-01-01 ENCOUNTER — Telehealth: Payer: Self-pay | Admitting: Gastroenterology

## 2020-01-01 ENCOUNTER — Encounter: Payer: Self-pay | Admitting: Gastroenterology

## 2020-01-01 NOTE — Telephone Encounter (Signed)
Please call patient and let her know I misread the date of her last iron panel. I though it was completed February 2021 and it was February 2020. Prior to starting oral iron, please have her complete an iron panel with ferritin with the labs I ordered at her visit on Monday 4/12. If she has already started iron, it isn't a big deal; she can just stop it until I have the results of the iron panel.

## 2020-01-01 NOTE — Assessment & Plan Note (Addendum)
Mild normocytic anemia with hemoglobin 11.8 on 11/27/2019.  These labs were completed in the setting of acute onset of diarrhea with CT revealing colitis with colonic wall thickening greatest at the sigmoid. Iron panel in Feb 2020 with ferritin 15 (L) and saturation 13% (L).  Patient has not been on oral iron.  Last colonoscopy 06/11/2004 with external hemorrhoids, few scattered left-sided diverticula, otherwise normal.  Repeat colonoscopy in 10 years.  History of antral ulcer in 2005 with last EGD in 2006 revealing distal esophageal erosions consistent with erosive reflux esophagitis, previously noted ulcer healed, otherwise normal exam.  Denies bright red blood per rectum, melena, abdominal pain, or GERD symptoms. On omeprazole twice a week and desires to continue this dosing for now. She does get full quickly and has decreased appetite which all started at the time of her diarrhea that unfortunately has not resolved as discussed below.   Unclear source of mild anemia. Possible occult blood loss related to colitis/? IBD. Can't rule out upper GI source or mild anemia related to slight decline in kidney function. Will also evaluate for celiac disease. Not sure if patient still has iron deficiency as her indices are within normal limits. Will go ahead and update an iron panel at this time. She is already scheduled for a colonoscopy on 01/23/20 to evaluate colitis/ongoing diarrhea which will help evaluate her anemia. If she has iron deficiency or if her upper GI symptoms of early satiety continue, we will also need to pursue EGD. Unfortunately due to scheduling and needing TCS ASAP, couldn't arrange TCS/EGD together. Plan to follow-up in 2 months.   Repeat iron panel with ferritin. (see phone note sent to nurse to arrange as I misread date of last iron panel) IgA and TTGIgA to evaluate for celiac disease.  Will start oral iron if her iron is low.  Proceed with TCS as scheduled on 01/23/20.  Follow-up in 2 months.

## 2020-01-02 ENCOUNTER — Other Ambulatory Visit: Payer: Self-pay

## 2020-01-02 DIAGNOSIS — Z8639 Personal history of other endocrine, nutritional and metabolic disease: Secondary | ICD-10-CM

## 2020-01-02 NOTE — Telephone Encounter (Signed)
Spoke with pt. Pt is aware that she will need an updated iron panel. Orders placed and all orders previously ordered with iron panel faxed to [place] lab. Pt is going to hold off on iron tabs until our office gives her the results of her Blood work.

## 2020-01-07 LAB — BASIC METABOLIC PANEL
BUN: 16 mg/dL (ref 7–25)
CO2: 31 mmol/L (ref 20–32)
Calcium: 10.1 mg/dL (ref 8.6–10.4)
Chloride: 98 mmol/L (ref 98–110)
Creat: 0.79 mg/dL (ref 0.60–0.93)
Glucose, Bld: 125 mg/dL (ref 65–139)
Potassium: 3.3 mmol/L — ABNORMAL LOW (ref 3.5–5.3)
Sodium: 138 mmol/L (ref 135–146)

## 2020-01-07 LAB — TSH: TSH: 0.96 mIU/L (ref 0.40–4.50)

## 2020-01-07 LAB — IRON,TIBC AND FERRITIN PANEL
%SAT: 14 % (calc) — ABNORMAL LOW (ref 16–45)
Ferritin: 19 ng/mL (ref 16–288)
Iron: 48 ug/dL (ref 45–160)
TIBC: 336 mcg/dL (calc) (ref 250–450)

## 2020-01-07 LAB — TISSUE TRANSGLUTAMINASE, IGA: (tTG) Ab, IgA: 1 U/mL

## 2020-01-07 LAB — IGA: Immunoglobulin A: 134 mg/dL (ref 70–320)

## 2020-01-09 ENCOUNTER — Other Ambulatory Visit: Payer: Self-pay | Admitting: Gastroenterology

## 2020-01-09 DIAGNOSIS — R197 Diarrhea, unspecified: Secondary | ICD-10-CM

## 2020-01-09 DIAGNOSIS — E876 Hypokalemia: Secondary | ICD-10-CM

## 2020-01-09 MED ORDER — POTASSIUM CHLORIDE ER 10 MEQ PO TBCR: 40.0000 meq | EXTENDED_RELEASE_TABLET | Freq: Every day | ORAL | 0 refills | Status: DC

## 2020-01-11 ENCOUNTER — Other Ambulatory Visit: Payer: Self-pay | Admitting: Emergency Medicine

## 2020-01-11 DIAGNOSIS — R197 Diarrhea, unspecified: Secondary | ICD-10-CM

## 2020-01-19 DIAGNOSIS — K52831 Collagenous colitis: Secondary | ICD-10-CM

## 2020-01-19 HISTORY — DX: Collagenous colitis: K52.831

## 2020-01-19 LAB — BASIC METABOLIC PANEL
BUN/Creatinine Ratio: 24 (calc) — ABNORMAL HIGH (ref 6–22)
BUN: 25 mg/dL (ref 7–25)
CO2: 25 mmol/L (ref 20–32)
Calcium: 10.4 mg/dL (ref 8.6–10.4)
Chloride: 102 mmol/L (ref 98–110)
Creat: 1.06 mg/dL — ABNORMAL HIGH (ref 0.60–0.93)
Glucose, Bld: 135 mg/dL (ref 65–139)
Potassium: 4.6 mmol/L (ref 3.5–5.3)
Sodium: 135 mmol/L (ref 135–146)

## 2020-01-21 ENCOUNTER — Other Ambulatory Visit (HOSPITAL_COMMUNITY)
Admission: RE | Admit: 2020-01-21 | Discharge: 2020-01-21 | Disposition: A | Discharge status: Home / Self Care | Payer: Medicare Other | Source: Ambulatory Visit | Attending: Internal Medicine | Admitting: Internal Medicine

## 2020-01-21 ENCOUNTER — Other Ambulatory Visit: Payer: Self-pay

## 2020-01-21 DIAGNOSIS — Z20822 Contact with and (suspected) exposure to covid-19: Secondary | ICD-10-CM | POA: Insufficient documentation

## 2020-01-21 DIAGNOSIS — Z01812 Encounter for preprocedural laboratory examination: Secondary | ICD-10-CM | POA: Diagnosis present

## 2020-01-22 LAB — SARS CORONAVIRUS 2 (TAT 6-24 HRS): SARS Coronavirus 2: NEGATIVE

## 2020-01-23 ENCOUNTER — Encounter (HOSPITAL_COMMUNITY): Payer: Self-pay | Admitting: Internal Medicine

## 2020-01-23 ENCOUNTER — Encounter (HOSPITAL_COMMUNITY)
Admission: RE | Disposition: A | Discharge status: Home / Self Care | Payer: Self-pay | Source: Home / Self Care | Attending: Internal Medicine

## 2020-01-23 ENCOUNTER — Other Ambulatory Visit: Payer: Self-pay

## 2020-01-23 ENCOUNTER — Ambulatory Visit (HOSPITAL_COMMUNITY)
Admission: RE | Admit: 2020-01-23 | Discharge: 2020-01-23 | Disposition: A | Discharge status: Home / Self Care | Payer: Medicare Other | Attending: Internal Medicine | Admitting: Internal Medicine

## 2020-01-23 ENCOUNTER — Other Ambulatory Visit: Payer: Self-pay | Admitting: Nurse Practitioner

## 2020-01-23 DIAGNOSIS — Z79890 Hormone replacement therapy (postmenopausal): Secondary | ICD-10-CM | POA: Diagnosis not present

## 2020-01-23 DIAGNOSIS — K219 Gastro-esophageal reflux disease without esophagitis: Secondary | ICD-10-CM | POA: Diagnosis not present

## 2020-01-23 DIAGNOSIS — K5730 Diverticulosis of large intestine without perforation or abscess without bleeding: Secondary | ICD-10-CM | POA: Insufficient documentation

## 2020-01-23 DIAGNOSIS — Z7982 Long term (current) use of aspirin: Secondary | ICD-10-CM | POA: Diagnosis not present

## 2020-01-23 DIAGNOSIS — E039 Hypothyroidism, unspecified: Secondary | ICD-10-CM | POA: Diagnosis not present

## 2020-01-23 DIAGNOSIS — J449 Chronic obstructive pulmonary disease, unspecified: Secondary | ICD-10-CM | POA: Insufficient documentation

## 2020-01-23 DIAGNOSIS — Z885 Allergy status to narcotic agent status: Secondary | ICD-10-CM | POA: Insufficient documentation

## 2020-01-23 DIAGNOSIS — F17210 Nicotine dependence, cigarettes, uncomplicated: Secondary | ICD-10-CM | POA: Diagnosis not present

## 2020-01-23 DIAGNOSIS — I10 Essential (primary) hypertension: Secondary | ICD-10-CM | POA: Diagnosis not present

## 2020-01-23 DIAGNOSIS — Z79899 Other long term (current) drug therapy: Secondary | ICD-10-CM | POA: Diagnosis not present

## 2020-01-23 DIAGNOSIS — M1990 Unspecified osteoarthritis, unspecified site: Secondary | ICD-10-CM | POA: Diagnosis not present

## 2020-01-23 DIAGNOSIS — K529 Noninfective gastroenteritis and colitis, unspecified: Secondary | ICD-10-CM | POA: Diagnosis not present

## 2020-01-23 DIAGNOSIS — R197 Diarrhea, unspecified: Secondary | ICD-10-CM

## 2020-01-23 DIAGNOSIS — E7800 Pure hypercholesterolemia, unspecified: Secondary | ICD-10-CM | POA: Diagnosis not present

## 2020-01-23 HISTORY — PX: COLONOSCOPY: SHX5424

## 2020-01-23 HISTORY — PX: BIOPSY: SHX5522

## 2020-01-23 SURGERY — COLONOSCOPY
Anesthesia: Moderate Sedation

## 2020-01-23 MED ORDER — MIDAZOLAM HCL 5 MG/5ML IJ SOLN
INTRAMUSCULAR | Status: DC | PRN
  Administered 2020-01-23: 1 mg via INTRAVENOUS
  Administered 2020-01-23: 2 mg via INTRAVENOUS
  Administered 2020-01-23 (×2): 1 mg via INTRAVENOUS

## 2020-01-23 MED ORDER — SODIUM CHLORIDE 0.9 % IV SOLN
INTRAVENOUS | Status: DC
  Administered 2020-01-23: 1000 mL via INTRAVENOUS

## 2020-01-23 MED ORDER — MEPERIDINE HCL 50 MG/ML IJ SOLN
INTRAMUSCULAR | Status: AC
  Filled 2020-01-23: qty 1

## 2020-01-23 MED ORDER — MEPERIDINE HCL 100 MG/ML IJ SOLN
INTRAMUSCULAR | Status: DC | PRN
  Administered 2020-01-23: 25 mg via INTRAVENOUS
  Administered 2020-01-23: 15 mg via INTRAVENOUS

## 2020-01-23 MED ORDER — STERILE WATER FOR IRRIGATION IR SOLN
Status: DC | PRN
  Administered 2020-01-23: 2.5 mL

## 2020-01-23 MED ORDER — MIDAZOLAM HCL 5 MG/5ML IJ SOLN
INTRAMUSCULAR | Status: AC
  Filled 2020-01-23: qty 10

## 2020-01-23 MED ORDER — ONDANSETRON HCL 4 MG/2ML IJ SOLN
INTRAMUSCULAR | Status: AC
  Filled 2020-01-23: qty 2

## 2020-01-23 NOTE — Op Note (Signed)
[facility] Patient Name: [name] Procedure Date: 01/23/2020 2:23 PM MRN: [id number] Date of Birth: 1941/05/11 Attending MD: Norvel Richards , MD CSN: [id number] Age: 79 Admit Type: Outpatient Procedure:                Colonoscopy Indications:              Chronic diarrhea Providers:                Norvel Richards, MD, Lurline Del, RN, Raphael Gibney, Technician Referring MD:              Medicines:                Midazolam 5 mg IV, Meperidine 40 mg IV, Ondansetron                            4 mg IV Complications:            No immediate complications. Estimated Blood Loss:     Estimated blood loss was minimal. Procedure:                Pre-Anesthesia Assessment:                           - Prior to the procedure, a History and Physical                            was performed, and patient medications and                            allergies were reviewed. The patient's tolerance of                            previous anesthesia was also reviewed. The risks                            and benefits of the procedure and the sedation                            options and risks were discussed with the patient.                            All questions were answered, and informed consent                            was obtained. Prior Anticoagulants: The patient has                            taken no previous anticoagulant or antiplatelet                            agents. ASA Grade Assessment: II - A patient with  mild systemic disease. After reviewing the risks                            and benefits, the patient was deemed in                            satisfactory condition to undergo the procedure.                           After obtaining informed consent, the colonoscope                            was passed under direct vision. Throughout the                            procedure, the patient's blood pressure,  pulse, and                            oxygen saturations were monitored continuously. The                            [id number] scope was introduced through                            the anus and advanced to the 5 cm into the ileum.                            The colonoscopy was performed without difficulty.                            The patient tolerated the procedure well. The                            quality of the bowel preparation was adequate. The                            terminal ileum, ileocecal valve, appendiceal                            orifice, and rectum were photographed. The entire                            colon was well visualized. Scope In: 2:36:57 PM Scope Out: 2:49:01 PM Scope Withdrawal Time: 0 hours 6 minutes 31 seconds  Total Procedure Duration: 0 hours 12 minutes 4 seconds  Findings:      The perianal and digital rectal examinations were normal.      Multiple small and large-mouthed diverticula were found in the sigmoid       colon and descending colon.      The exam was otherwise without abnormality on direct and retroflexion       views. Distal 5 cm of terminal ileum appeared normal. Segmental biopsies       of the right and left colon taken for histologic study. Impression:               -  Diverticulosis in the sigmoid colon and in the                            descending colon.                           - The examination was otherwise normal on direct                            and retroflexion views.                           -Status post segmental biopsy Moderate Sedation:      Moderate (conscious) sedation was administered by the endoscopy nurse       and supervised by the endoscopist. The following parameters were       monitored: oxygen saturation, heart rate, blood pressure, respiratory       rate, EKG, adequacy of pulmonary ventilation, and response to care.       Total physician intraservice time was 23 minutes. Recommendation:            - Patient has a contact number available for                            emergencies. The signs and symptoms of potential                            delayed complications were discussed with the                            patient. Return to normal activities tomorrow.                            Written discharge instructions were provided to the                            patient.                           - Advance diet as tolerated.                           - Continue present medications. Follow-up on                            pathology.                           - No repeat colonoscopy due to age.                           - Return to GI clinic in 6 weeks. Procedure Code(s):        --- Professional ---                           [phone], Colonoscopy, flexible; diagnostic, including  collection of specimen(s) by brushing or washing,                            when performed (separate procedure)                           99153, Moderate sedation; each additional 15                            minutes intraservice time                           G0500, Moderate sedation services provided by the                            same physician or other qualified health care                            professional performing a gastrointestinal                            endoscopic service that sedation supports,                            requiring the presence of an independent trained                            observer to assist in the monitoring of the                            patient's level of consciousness and physiological                            status; initial 15 minutes of intra-service time;                            patient age 10 years or older (additional time may                            be reported with [id number], as appropriate) Diagnosis Code(s):        --- Professional ---                           K52.9, Noninfective gastroenteritis and colitis,                             unspecified                           K57.30, Diverticulosis of large intestine without                            perforation or abscess without bleeding CPT copyright 2019 [employer]. All rights reserved. The codes documented in this report are preliminary and upon coder review may  be revised to meet current compliance  requirements. Cristopher Estimable. Bret Stamour, MD Norvel Richards, MD 01/23/2020 2:54:38 PM This report has been signed electronically. Number of Addenda: 0

## 2020-01-23 NOTE — Discharge Instructions (Signed)
Colonoscopy Discharge Instructions  Read the instructions outlined below and refer to this sheet in the next few weeks. These discharge instructions provide you with general information on caring for yourself after you leave the hospital. Your doctor may also give you specific instructions. While your treatment has been planned according to the most current medical practices available, unavoidable complications occasionally occur. If you have any problems or questions after discharge, call Dr. Gala Romney at [phone]. ACTIVITY  You may resume your regular activity, but move at a slower pace for the next 24 hours.   Take frequent rest periods for the next 24 hours.   Walking will help get rid of the air and reduce the bloated feeling in your belly (abdomen).   No driving for 24 hours (because of the medicine (anesthesia) used during the test).    Do not sign any important legal documents or operate any machinery for 24 hours (because of the anesthesia used during the test).  NUTRITION  Drink plenty of fluids.   You may resume your normal diet as instructed by your doctor.   Begin with a light meal and progress to your normal diet. Heavy or fried foods are harder to digest and may make you feel sick to your stomach (nauseated).   Avoid alcoholic beverages for 24 hours or as instructed.  MEDICATIONS  You may resume your normal medications unless your doctor tells you otherwise.  WHAT YOU CAN EXPECT TODAY  Some feelings of bloating in the abdomen.   Passage of more gas than usual.   Spotting of blood in your stool or on the toilet paper.  IF YOU HAD POLYPS REMOVED DURING THE COLONOSCOPY:  No aspirin products for 7 days or as instructed.   No alcohol for 7 days or as instructed.   Eat a soft diet for the next 24 hours.  FINDING OUT THE RESULTS OF YOUR TEST Not all test results are available during your visit. If your test results are not back during the visit, make an appointment  with your caregiver to find out the results. Do not assume everything is normal if you have not heard from your caregiver or the medical facility. It is important for you to follow up on all of your test results.  SEEK IMMEDIATE MEDICAL ATTENTION IF:  You have more than a spotting of blood in your stool.   Your belly is swollen (abdominal distention).   You are nauseated or vomiting.   You have a temperature over 101.   You have abdominal pain or discomfort that is severe or gets worse throughout the day.   Diverticulosis information provided.  Biopsies of your colon were taken today  Further recommendations to follow in the near future once the pathology report is back for review  At patient request, I called husband at [phone] -and reviewed results   Diverticulosis  Diverticulosis is a condition that develops when small pouches (diverticula) form in the wall of the large intestine (colon). The colon is where water is absorbed and stool (feces) is formed. The pouches form when the inside layer of the colon pushes through weak spots in the outer layers of the colon. You may have a few pouches or many of them. The pouches usually do not cause problems unless they become inflamed or infected. When this happens, the condition is called diverticulitis. What are the causes? The cause of this condition is not known. What increases the risk? The following factors may make you more likely  to develop this condition:  Being older than age 11. Your risk for this condition increases with age. Diverticulosis is rare among people younger than age 24. By age 50, many people have it.  Eating a low-fiber diet.  Having frequent constipation.  Being overweight.  Not getting enough exercise.  Smoking.  Taking over-the-counter pain medicines, like aspirin and ibuprofen.  Having a family history of diverticulosis. What are the signs or symptoms? In most people, there are no symptoms of this  condition. If you do have symptoms, they may include:  Bloating.  Cramps in the abdomen.  Constipation or diarrhea.  Pain in the lower left side of the abdomen. How is this diagnosed? Because diverticulosis usually has no symptoms, it is most often diagnosed during an exam for other colon problems. The condition may be diagnosed by:  Using a flexible scope to examine the colon (colonoscopy).  Taking an X-ray of the colon after dye has been put into the colon (barium enema).  Having a CT scan. How is this treated? You may not need treatment for this condition. Your health care provider may recommend treatment to prevent problems. You may need treatment if you have symptoms or if you previously had diverticulitis. Treatment may include:  Eating a high-fiber diet.  Taking a fiber supplement.  Taking a live bacteria supplement (probiotic).  Taking medicine to relax your colon. Follow these instructions at home: Medicines  Take over-the-counter and prescription medicines only as told by your health care provider.  If told by your health care provider, take a fiber supplement or probiotic. Constipation prevention Your condition may cause constipation. To prevent or treat constipation, you may need to:  Drink enough fluid to keep your urine pale yellow.  Take over-the-counter or prescription medicines.  Eat foods that are high in fiber, such as beans, whole grains, and fresh fruits and vegetables.  Limit foods that are high in fat and processed sugars, such as fried or sweet foods.  General instructions  Try not to strain when you have a bowel movement.  Keep all follow-up visits as told by your health care provider. This is important. Contact a health care provider if you:  Have pain in your abdomen.  Have bloating.  Have cramps.  Have not had a bowel movement in 3 days. Get help right away if:  Your pain gets worse.  Your bloating becomes very bad.  You have  a fever or chills, and your symptoms suddenly get worse.  You vomit.  You have bowel movements that are bloody or black.  You have bleeding from your rectum. Summary  Diverticulosis is a condition that develops when small pouches (diverticula) form in the wall of the large intestine (colon).  You may have a few pouches or many of them.  This condition is most often diagnosed during an exam for other colon problems.  Treatment may include increasing the fiber in your diet, taking supplements, or taking medicines. This information is not intended to replace advice given to you by your health care provider. Make sure you discuss any questions you have with your health care provider. Document Revised: 04/05/2019 Document Reviewed: 04/05/2019 Elsevier Patient Education  [place].

## 2020-01-23 NOTE — Interval H&P Note (Signed)
History and Physical Interval Note:  01/23/2020 2:19 PM  [name]  has presented today for surgery, with the diagnosis of diarrhea, colitis, anemia.  The various methods of treatment have been discussed with the patient and family. After consideration of risks, benefits and other options for treatment, the patient has consented to  Procedure(s) with comments: COLONOSCOPY (N/A) - 2:00pm as a surgical intervention.  The patient's history has been reviewed, patient examined, no change in status, stable for surgery.  I have reviewed the patient's chart and labs.  Questions were answered to the patient's satisfaction.     [name]  No change.  Diagnostic colonoscopy per plan.  The risks, benefits, limitations, alternatives and imponderables have been reviewed with the patient. Questions have been answered. All parties are agreeable.

## 2020-01-25 LAB — SURGICAL PATHOLOGY

## 2020-01-27 ENCOUNTER — Encounter: Payer: Self-pay | Admitting: Internal Medicine

## 2020-01-28 ENCOUNTER — Other Ambulatory Visit: Payer: Self-pay

## 2020-01-28 MED ORDER — BUDESONIDE 3 MG PO CPEP
9.0000 mg | ORAL_CAPSULE | Freq: Every day | ORAL | 2 refills | Status: DC
Start: 2020-01-28 — End: 2020-03-06

## 2020-01-29 ENCOUNTER — Telehealth: Payer: Self-pay

## 2020-01-29 ENCOUNTER — Other Ambulatory Visit: Payer: Self-pay

## 2020-01-29 ENCOUNTER — Ambulatory Visit (INDEPENDENT_AMBULATORY_CARE_PROVIDER_SITE_OTHER): Payer: Medicare Other | Admitting: Cardiovascular Disease

## 2020-01-29 ENCOUNTER — Encounter: Payer: Self-pay | Admitting: Cardiovascular Disease

## 2020-01-29 VITALS — BP 132/68 | HR 76 | Temp 98.4°F | Ht 65.0 in | Wt 143.2 lb

## 2020-01-29 DIAGNOSIS — E782 Mixed hyperlipidemia: Secondary | ICD-10-CM | POA: Diagnosis not present

## 2020-01-29 DIAGNOSIS — Z720 Tobacco use: Secondary | ICD-10-CM

## 2020-01-29 DIAGNOSIS — I6523 Occlusion and stenosis of bilateral carotid arteries: Secondary | ICD-10-CM

## 2020-01-29 NOTE — Assessment & Plan Note (Signed)
History of hyperlipidemia on statin therapy.  We will recheck a lipid liver profile 

## 2020-01-29 NOTE — Assessment & Plan Note (Signed)
History of ongoing tobacco abuse of 5 to 6 cigarettes a day recalcitrant risk factor modification. °

## 2020-01-29 NOTE — Patient Instructions (Signed)
Medication Instructions:  NO CHANGE *If you need a refill on your cardiac medications before your next appointment, please call your pharmacy*   Lab Work: Your physician recommends that you return for lab work PRIOR TO EATING  If you have labs (blood work) drawn today and your tests are completely normal, you will receive your results only by: Marland Kitchen MyChart Message (if you have MyChart) OR . A paper copy in the mail If you have any lab test that is abnormal or we need to change your treatment, we will call you to review the results.   Testing/Procedures: Your physician has requested that you have a carotid duplex. This test is an ultrasound of the carotid arteries in your neck. It looks at blood flow through these arteries that supply the brain with blood. Allow one hour for this exam. There are no restrictions or special instructions.SCHEDULE IN AUGUST 2021    Follow-Up: At [facility], you and your health needs are our priority.  As part of our continuing mission to provide you with exceptional heart care, we have created designated Provider Care Teams.  These Care Teams include your primary Cardiologist (physician) and Advanced Practice Providers (APPs -  Physician Assistants and Nurse Practitioners) who all work together to provide you with the care you need, when you need it.  We recommend signing up for the patient portal called "MyChart".  Sign up information is provided on this After Visit Summary.  MyChart is used to connect with patients for Virtual Visits (Telemedicine).  Patients are able to view lab/test results, encounter notes, upcoming appointments, etc.  Non-urgent messages can be sent to your provider as well.   To learn more about what you can do with MyChart, go to [email or web address].    Your next appointment:   12 month(s)  The format for your next appointment:   Either In Person or Virtual  Provider:   You may see Quay Burow MD or one of the following  Advanced Practice Providers on your designated Care Team:    Kerin Ransom, PA-C  [place], [place]  Coletta Memos, [place]

## 2020-01-29 NOTE — Assessment & Plan Note (Signed)
History of carotid artery disease with Dopplers performed 04/30/2019 revealing moderate bilateral ICA stenosis.  We will get carotid Dopplers again in August of this year.

## 2020-01-29 NOTE — Telephone Encounter (Signed)
Pt called and had a few questions since her TCS. Pt was prescribed Colestid 1gm and was taking it as directed. Letter was sent to pt and it states RMR nurse will get with pt. Xifaxan was prescribed for pt. Pt wants to know will she continue the Colestid if she takes Xifaxan? Pt would also like to know if she needs to continue a soft diet. Per RMR TCS report pt can resume foods as tolerated. Pt says she knows to avoid red meats per [facility] recommendations. Please advise.

## 2020-01-29 NOTE — Telephone Encounter (Signed)
Correction Budesonide. Pt is aware that it's ok to d/c Colesid and start Budesonide. Ok to advance diet as tolerated.

## 2020-01-29 NOTE — Progress Notes (Signed)
01/29/2020 [name]   [birth year]  [id number]  Primary Physician Lemmie Evens, MD Primary Cardiologist: Lorretta Harp MD FACP, [place], [place], [place]  HPI:  [name] is a 79 y.o.  mildly-overweight married Caucasian female, mother of 2 and grandmother of 3 grandchildren, whom I last saw in the office  12/23/2017.  She is accompanied by her husband today.. She has a history of moderate left internal carotid artery stenosis which we have been following by duplex ultrasound on an annual basis. She continues to smoke 10-15 cigarettes a day despite counseling to the contrary. Her other problems include hypertension and hyperlipidemia. She denies chest pain or shortness of breath. She had a Myoview performed November 27, 2008, which was nonischemic. Dr. Karie Kirks follows her lipid profile.  Since I saw her 2 years ago she is remained stable.  She denies chest pain or shortness of breath.  Unfortunately she still smokes 5 to 6 cigarettes a day.  Her last carotid Doppler study performed in August of last year did show moderate bilateral ICA stenosis.  Current Meds  Medication Sig  . acyclovir (ZOVIRAX) 200 MG capsule Take 200 mg by mouth 5 (five) times daily as needed (fever blisters).   Marland Kitchen albuterol (PROVENTIL HFA;VENTOLIN HFA) 108 (90 BASE) MCG/ACT inhaler Inhale 2 puffs into the lungs every 6 (six) hours as needed for wheezing or shortness of breath.  . ALPRAZolam (XANAX) 0.5 MG tablet Take 0.25 mg by mouth daily as needed for anxiety or sleep.   . Ascorbic Acid (VITAMIN C) 1000 MG tablet Take 1,000 mg by mouth daily.  Marland Kitchen aspirin EC 81 MG tablet Take 81 mg by mouth 2 (two) times a week. Wednesday and sundays  . benazepril (LOTENSIN) 10 MG tablet Take 10 mg by mouth at bedtime.   . cholecalciferol (VITAMIN D3) 25 MCG (1000 UNIT) tablet Take 1,000 Units by mouth daily.  Marland Kitchen dicyclomine (BENTYL) 10 MG capsule TAKE 1 CAPSULE (10 MG TOTAL) BY MOUTH 2 (TWO) TIMES DAILY BEFORE A MEAL.  .  hydrochlorothiazide (MICROZIDE) 12.5 MG capsule Take 12.5 mg by mouth daily.   Marland Kitchen levothyroxine (SYNTHROID, LEVOTHROID) 88 MCG tablet Take 88 mcg by mouth daily before breakfast.  . loperamide (IMODIUM) 2 MG capsule Take 2-4 mg by mouth as needed for diarrhea or loose stools.  . Multiple Vitamins-Minerals (PRESERVISION AREDS 2 PO) Take 1 tablet by mouth daily.   Marland Kitchen omeprazole (PRILOSEC) 20 MG capsule Take 20 mg by mouth 2 (two) times a week. Wednesday and Sundays  . Potassium 99 MG TABS Take 99 mg by mouth daily.  . vitamin B-12 (CYANOCOBALAMIN) 1000 MCG tablet Take 1,000 mcg by mouth every other day.     Allergies  Allergen Reactions  . Codeine Nausea And Vomiting    Social History   Socioeconomic History  . Marital status: Married    Spouse name: Not on file  . Number of children: Not on file  . Years of education: Not on file  . Highest education level: Not on file  Occupational History  . Not on file  Tobacco Use  . Smoking status: Current Every Day Smoker    Packs/day: 0.25    Years: 55.00    Pack years: 13.75    Types: Cigarettes  . Smokeless tobacco: Never Used  . Tobacco comment: 4-5 cigs/day  Substance and Sexual Activity  . Alcohol use: No  . Drug use: No  . Sexual activity: Yes    Birth control/protection:  Surgical  Other Topics Concern  . Not on file  Social History Narrative  . Not on file   Social Determinants of Health   Financial Resource Strain:   . Difficulty of Paying Living Expenses:   Food Insecurity:   . Worried About Charity fundraiser in the Last Year:   . Arboriculturist in the Last Year:   Transportation Needs:   . Film/video editor (Medical):   Marland Kitchen Lack of Transportation (Non-Medical):   Physical Activity:   . Days of Exercise per Week:   . Minutes of Exercise per Session:   Stress:   . Feeling of Stress :   Social Connections:   . Frequency of Communication with Friends and Family:   . Frequency of Social Gatherings with Friends  and Family:   . Attends Religious Services:   . Active Member of Clubs or Organizations:   . Attends Archivist Meetings:   Marland Kitchen Marital Status:   Intimate Partner Violence:   . Fear of Current or Ex-Partner:   . Emotionally Abused:   Marland Kitchen Physically Abused:   . Sexually Abused:      Review of Systems: General: negative for chills, fever, night sweats or weight changes.  Cardiovascular: negative for chest pain, dyspnea on exertion, edema, orthopnea, palpitations, paroxysmal nocturnal dyspnea or shortness of breath Dermatological: negative for rash Respiratory: negative for cough or wheezing Urologic: negative for hematuria Abdominal: negative for nausea, vomiting, diarrhea, bright red blood per rectum, melena, or hematemesis Neurologic: negative for visual changes, syncope, or dizziness All other systems reviewed and are otherwise negative except as noted above.    Blood pressure 132/68, pulse 76, temperature 98.4 F (36.9 C), height 5\' 5"  (1.651 m), weight 143 lb 3.2 oz (65 kg), SpO2 97 %.  General appearance: alert and no distress Neck: no adenopathy, no carotid bruit, no JVD, supple, symmetrical, trachea midline and thyroid not enlarged, symmetric, no tenderness/mass/nodules Lungs: clear to auscultation bilaterally Heart: regular rate and rhythm, S1, S2 normal, no murmur, click, rub or gallop Extremities: extremities normal, atraumatic, no cyanosis or edema Pulses: 2+ and symmetric Skin: Skin color, texture, turgor normal. No rashes or lesions Neurologic: Alert and oriented X 3, normal strength and tone. Normal symmetric reflexes. Normal coordination and gait  EKG sinus rhythm at 76 with septal Q waves.  I personally reviewed this EKG.  ASSESSMENT AND PLAN:   Essential hypertension History of essential hypertension with blood pressure measured today at 132/68.  She is on Lotensin, hydrochlorothiazide.  Hyperlipidemia History of hyperlipidemia on statin therapy.  We  will recheck a lipid liver profile.  Carotid artery disease History of carotid artery disease with Dopplers performed 04/30/2019 revealing moderate bilateral ICA stenosis.  We will get carotid Dopplers again in August of this year.  Tobacco abuse History of ongoing tobacco abuse of 5 to 6 cigarettes a day recalcitrant risk factor modification.      Lorretta Harp MD FACP,FACC,FAHA, [facility] 01/29/2020 3:57 PM

## 2020-01-29 NOTE — Telephone Encounter (Signed)
Looks like Budesonide was prescribed for patient for collagenous colitis, not Xifaxan. Ok to stop colestid and take the budesonide as prescribed. Ok to advance diet as tolerated.

## 2020-01-29 NOTE — Assessment & Plan Note (Signed)
History of essential hypertension with blood pressure measured today at 132/68.  She is on Lotensin, hydrochlorothiazide.

## 2020-01-30 ENCOUNTER — Telehealth: Payer: Self-pay

## 2020-01-30 NOTE — Telephone Encounter (Signed)
PA was submitted through [email or web address] for Budesonide 3 mg. Waiting on an approval or denial.

## 2020-02-02 LAB — HEPATIC FUNCTION PANEL
ALT: 9 IU/L (ref 0–32)
AST: 12 IU/L (ref 0–40)
Albumin: 4.4 g/dL (ref 3.7–4.7)
Alkaline Phosphatase: 138 IU/L — ABNORMAL HIGH (ref 39–117)
Bilirubin Total: <0.2 mg/dL (ref 0.0–1.2)
Bilirubin, Direct: 0.09 mg/dL (ref 0.00–0.40)
Total Protein: 6.8 g/dL (ref 6.0–8.5)

## 2020-02-02 LAB — LIPID PANEL
Chol/HDL Ratio: 5.2 ratio — ABNORMAL HIGH (ref 0.0–4.4)
Cholesterol, Total: 230 mg/dL — ABNORMAL HIGH (ref 100–199)
HDL: 44 mg/dL (ref >39)
LDL Chol Calc (NIH): 150 mg/dL — ABNORMAL HIGH (ref 0–99)
Triglycerides: 199 mg/dL — ABNORMAL HIGH (ref 0–149)
VLDL Cholesterol Cal: 36 mg/dL (ref 5–40)

## 2020-02-06 ENCOUNTER — Other Ambulatory Visit: Payer: Self-pay | Admitting: *Deleted

## 2020-02-06 DIAGNOSIS — E782 Mixed hyperlipidemia: Secondary | ICD-10-CM

## 2020-02-08 ENCOUNTER — Telehealth: Payer: Self-pay | Admitting: Emergency Medicine

## 2020-02-08 NOTE — Telephone Encounter (Signed)
Pt called said insurance denied her Budesonide 3 mg. For her PA on 5/12 from rmr. Do you know if there is anything else provider recommends or wants to order

## 2020-02-11 NOTE — Telephone Encounter (Signed)
Spoke with [facility] [employer] order. They said pt needed a PA. A PA was previously done last week. PA was denied and an appeal has been filed. Waiting on a reply.

## 2020-02-15 NOTE — Telephone Encounter (Signed)
Pt called in to follow up on medication.  Informed her that AM spoke with [facility] Mail order. They said it needed a PA. A PA was previously done last week. PA was denied and an appeal has been filed. Pt made aware that we are waiting on a reply.  Pt voiced understanding.

## 2020-02-28 ENCOUNTER — Telehealth: Payer: Self-pay | Admitting: Internal Medicine

## 2020-02-28 NOTE — Telephone Encounter (Signed)
Patient called asking if anything has resulted form the appeal for her medication   Also asked if something cheaper could be called in  [phone]

## 2020-03-03 ENCOUNTER — Ambulatory Visit: Payer: Medicare Other | Admitting: Gastroenterology

## 2020-03-04 NOTE — Telephone Encounter (Signed)
Called pts insurance company at 4:45 pm and they are still have technical difficulties. Spoke with pt. She is aware that the system is down. I'm going to call pts insurance in the morning and get back with pt.

## 2020-03-04 NOTE — Telephone Encounter (Signed)
I've called pts insurance company twice. I just got off the phone with a [facility] representative. Their system is currently down and has been down every time I call. I was asked to call back in 30 mins.

## 2020-03-05 NOTE — Telephone Encounter (Signed)
Spoke with pts insurance company. Appeal is still pending. Reference number [id number]

## 2020-03-06 ENCOUNTER — Telehealth: Payer: Self-pay | Admitting: Internal Medicine

## 2020-03-06 ENCOUNTER — Other Ambulatory Visit: Payer: Self-pay | Admitting: Gastroenterology

## 2020-03-06 DIAGNOSIS — K52831 Collagenous colitis: Secondary | ICD-10-CM

## 2020-03-06 MED ORDER — BUDESONIDE 3 MG PO CPEP: 9.0000 mg | ORAL_CAPSULE | Freq: Every day | ORAL | 1 refills | Status: DC

## 2020-03-06 NOTE — Telephone Encounter (Signed)
Is patient taking 3mg  or 9mg  total? Looks like the original prescription was: Budesonide 3mg  with instructions to take 3 capsules (9 mg total) daily.

## 2020-03-06 NOTE — Telephone Encounter (Signed)
See other phone note. Message sent to [facility].

## 2020-03-06 NOTE — Telephone Encounter (Signed)
Rx sent 

## 2020-03-06 NOTE — Telephone Encounter (Signed)
[facility], it's been an ongoing battle with pts insurance company. We're trying to get the appeal reviewed and it's still pending for Budesonide 3 mg. Can you please send Budesonide 3 mg to [facility] [address]. I've discussed with pt that she can get budsonide 3 mg #90 capsules for $75.78 at [facility] using a GoodRx card. Pt is ok to pay the $75.78 right now while waiting on her insurance company. [facility] wants pt to pay $500.00 for the medication. I've mailed the coupons to pt. Pt's diarrhea hasn't been bad. Pt may have a BM 2-3 times after eating her evening meal and with an Imodium, it's controlled.

## 2020-03-06 NOTE — Telephone Encounter (Signed)
[phone] PATIENT CALLED AND NEEDS TO SPEAK TO A NURSE. SHE HAS SOME QUESTIONS ABOUT HER MEDICATIONS

## 2020-03-06 NOTE — Telephone Encounter (Signed)
RMR wants her to take 9 mg daily per his note.

## 2020-03-06 NOTE — Telephone Encounter (Signed)
Noted. Pt is aware 

## 2020-03-26 ENCOUNTER — Other Ambulatory Visit: Payer: Self-pay | Admitting: Gastroenterology

## 2020-03-26 DIAGNOSIS — R197 Diarrhea, unspecified: Secondary | ICD-10-CM

## 2020-03-30 NOTE — Progress Notes (Signed)
Referring Provider: Lemmie Evens, MD Primary Care Physician:  Lemmie Evens, MD Primary GI Physician: Dr. Gala Romney  Chief Complaint  Patient presents with  . Diarrhea    occ    HPI:   [name] is a 79 y.o. female presenting today for follow-up diarrhea.  Last seen in our office for the same 01/01/2020.  She was noted to have acute colitis on CT in March 2021 with colonic wall thickening greatest at the sigmoid colon where there was also mesenteric stranding.  She was treated with Cipro and Flagyl.  Antibiotics did not provide improvement.  Stool studies with C. difficile and GI pathogen panel negative.  Was taking Colestid twice daily, Imodium as needed, and Bentyl twice daily.  Overall, did not feel the addition of Bentyl back in March had provided any significant improvement.  She was to proceed with colonoscopy.  We will also evaluate for thyroid abnormalities and celiac disease.  Of Bentyl had not provided any improvement, she was advised to stop this and use Colestid twice daily and Imodium as needed to control diarrhea.  BMP also updated due to history of hypokalemia.  Labs 01/04/2020: Celiac serologies negative, TSH within normal limits, iron 48 and ferritin 19, both on the low end of normal, saturation ratio 14% (L).  Colonoscopy 01/23/2020: Multiple small largemouth diverticula in the sigmoid and descending colon, otherwise normal exam s/p segmental biopsies of right and left colon for histologic study.  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  Patient was prescribed on budesonide 9 mg daily.  Several telephone notes with difficulty getting budesonide approved.  Telephone note 02/28/2020 requesting budesonide to be sent to [facility] while waiting for PA appeal.  At that time, patient reported diarrhea had not been bad with 2-3 BMs daily after evening meal and with Imodium, it is controlled.  Prescription was sent to [facility].  Today:  Diarrhea is better than  it used to be.  She is not sure what medication she is taking at this time.  She never went to [facility] to pick up Entocort.  States she was told the good Rx card was not in their system. She is describing Colestid as she notes she has to take 1 pill in the morning and evening and it has to be separated from other medications.  She is only taking Colestid once in the morning as she is not sure how to adjust her evening medications to allow appropriate timing of Colestid.  She is also taking 1 Imodium every morning and 2 Imodium in the evening to control diarrhea.  Typically, she does not have bowel movements until after dinner.  She will have about 4-5 watery BMs between dinner and bedtime.  Denies BRBPR, melena, abdominal pain, nausea, or vomiting.  She is avoiding dairy and following a low-fat diet.    Weight is fairly stable over the last 3 months.  She has been losing weight more rapidly when diarrhea was more frequent back in February/March.  Feels her appetite is doing better. Doesn't eat a lot for breakfast but does ok with lunch and dinner. No GERD symptoms, or dysphagia. Taking omeprazole twice a week.   She is not taking any NSAIDs other than baby aspirin.  She does continue to but is working on this.  She is only smoking 5 to 6 cigarettes a day which is improvement.   Past Medical History:  Diagnosis Date  . Arthritis   . Carotid artery disease ([place])   .  COPD (chronic obstructive pulmonary disease) ([place])   . Dysrhythmia    palpitations  . GERD (gastroesophageal reflux disease)   . Hypercholesteremia   . Hypertension   . Hypothyroidism   . Shortness of breath     Past Surgical History:  Procedure Laterality Date  . ABDOMINAL HYSTERECTOMY    . APPENDECTOMY    . BACK SURGERY  2019  . BIOPSY  01/23/2020   Procedure: BIOPSY;  Surgeon: Daneil Dolin, MD;  Location: [facility] ENDO SUITE;  Service: Endoscopy;;  ascending, desending colon, sigmoid  . BREAST BIOPSY Left 05/04/2017   Procedure:  BREAST BIOPSY WITH NEEDLE LOCALIZATION;  Surgeon: Aviva Signs, MD;  Location: [facility] ORS;  Service: General;  Laterality: Left;  needle loc at 8:00  . CATARACT EXTRACTION W/PHACO Left 09/30/2014   Procedure: CATARACT EXTRACTION PHACO AND INTRAOCULAR LENS PLACEMENT LEFT EYE;  Surgeon: Tonny Branch, MD;  Location: [facility] ORS;  Service: Ophthalmology;  Laterality: Left;  CDE:8.79  . CATARACT EXTRACTION W/PHACO Right 10/17/2014   Procedure: CATARACT EXTRACTION PHACO AND INTRAOCULAR LENS PLACEMENT RIGHT EYE;  Surgeon: Tonny Branch, MD;  Location: [facility] ORS;  Service: Ophthalmology;  Laterality: Right;  CDE: 7.80  . CHOLECYSTECTOMY N/A 04/30/2013   Procedure: Attempted LAPAROSCOPIC CHOLECYSTECTOMY ; converted to open procedure @ 1510;  Surgeon: Scherry Ran, MD;  Location: [facility] ORS;  Service: General;  Laterality: N/A;  . CHOLECYSTECTOMY N/A 04/30/2013   Procedure: CHOLECYSTECTOMY;  Surgeon: Scherry Ran, MD;  Location: [facility] ORS;  Service: General;  Laterality: N/A;  procedure started @ 1520  . COLONOSCOPY  06/11/2004   External hemorrhoids, few scattered left-sided diverticula, otherwise normal.  Repeat colonoscopy in 10 years.  . COLONOSCOPY N/A 01/23/2020   Procedure: COLONOSCOPY;  Surgeon: Daneil Dolin, MD;  Multiple small largemouth diverticula in the sigmoid and descending colon, otherwise normal exam s/p segmental biopsies of right and left colon for histologic study.  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  Marland Kitchen ESOPHAGOGASTRODUODENOSCOPY  06/11/2004   Distal esophageal erosions consistent with mild erosive reflux esophagitis, heaped up adenomatous appearing prepyloric antral mucosa biopsy, single antral prepyloric ulcer biopsied, normal D1 and D2.  . ESOPHAGOGASTRODUODENOSCOPY  10/07/2004   Distal esophageal erosions consistent with erosive reflux esophagitis, otherwise normal esophagus, normal stomach, previously noted ulcer healed completely, normal D1 and D2.  Marland Kitchen LAMINECTOMY   12/26/2017   spine surgery Dr Carloyn Manner  . TUBAL LIGATION      Current Outpatient Medications  Medication Sig Dispense Refill  . acetaminophen (TYLENOL) 500 MG tablet Take 1,000 mg by mouth every 6 (six) hours as needed.    Marland Kitchen acyclovir (ZOVIRAX) 200 MG capsule Take 200 mg by mouth 5 (five) times daily as needed (fever blisters).     Marland Kitchen albuterol (PROVENTIL HFA;VENTOLIN HFA) 108 (90 BASE) MCG/ACT inhaler Inhale 2 puffs into the lungs every 6 (six) hours as needed for wheezing or shortness of breath.    . ALPRAZolam (XANAX) 0.5 MG tablet Take 0.25 mg by mouth daily as needed for anxiety or sleep.     . Ascorbic Acid (VITAMIN C) 1000 MG tablet Take 1,000 mg by mouth daily.    Marland Kitchen aspirin EC 81 MG tablet Take 81 mg by mouth 2 (two) times a week. Wednesday and sundays    . atorvastatin (LIPITOR) 40 MG tablet Take 40 mg by mouth daily at 6 PM.     . benazepril (LOTENSIN) 10 MG tablet Take 10 mg by mouth at bedtime.     . cholecalciferol (  VITAMIN D3) 25 MCG (1000 UNIT) tablet Take 1,000 Units by mouth daily.    . colestipol (COLESTID) 1 g tablet Take 1 g by mouth 2 (two) times daily. Take other medications either 1 hour before or 4 hours after colestid    . gabapentin (NEURONTIN) 300 MG capsule Take 300 mg by mouth 3 (three) times daily.    . hydrochlorothiazide (MICROZIDE) 12.5 MG capsule Take 12.5 mg by mouth daily.     Marland Kitchen levothyroxine (SYNTHROID, LEVOTHROID) 88 MCG tablet Take 88 mcg by mouth daily before breakfast.    . loperamide (IMODIUM) 2 MG capsule Take 2-4 mg by mouth as needed for diarrhea or loose stools.    Marland Kitchen MELATONIN PO Take 3 mg by mouth at bedtime.    . Multiple Vitamins-Minerals (PRESERVISION AREDS 2 PO) Take 1 tablet by mouth daily.     Marland Kitchen omeprazole (PRILOSEC) 20 MG capsule Take 20 mg by mouth 2 (two) times a week. Wednesday and Sundays    . Potassium 99 MG TABS Take 99 mg by mouth daily.    . vitamin B-12 (CYANOCOBALAMIN) 1000 MCG tablet Take 1,000 mcg by mouth every other day.    .  budesonide (ENTOCORT EC) 3 MG 24 hr capsule Take 3 capsules (9 mg total) by mouth daily. (Patient not taking: Reported on 03/31/2020) 90 capsule 1   No current facility-administered medications for this visit.   Facility-Administered Medications Ordered in Other Visits  Medication Dose Route Frequency Provider Last Rate Last Admin  . fentaNYL (SUBLIMAZE) injection 25-50 mcg  25-50 mcg Intravenous Q5 min PRN Lerry Liner, MD        Allergies as of 03/31/2020 - Review Complete 03/31/2020  Allergen Reaction Noted  . Codeine Nausea And Vomiting 04/26/2013    Family History  Problem Relation Age of Onset  . Heart failure Mother   . Cancer Father   . Colon cancer Neg Hx   . Inflammatory bowel disease Neg Hx     Social History   Socioeconomic History  . Marital status: Married    Spouse name: Not on file  . Number of children: Not on file  . Years of education: Not on file  . Highest education level: Not on file  Occupational History  . Not on file  Tobacco Use  . Smoking status: Current Every Day Smoker    Packs/day: 0.25    Years: 55.00    Pack years: 13.75    Types: Cigarettes  . Smokeless tobacco: Never Used  . Tobacco comment: 4-5 cigs/day  Vaping Use  . Vaping Use: Never used  Substance and Sexual Activity  . Alcohol use: No  . Drug use: No  . Sexual activity: Yes    Birth control/protection: Surgical  Other Topics Concern  . Not on file  Social History Narrative  . Not on file   Social Determinants of Health   Financial Resource Strain:   . Difficulty of Paying Living Expenses:   Food Insecurity:   . Worried About Charity fundraiser in the Last Year:   . Arboriculturist in the Last Year:   Transportation Needs:   . Film/video editor (Medical):   Marland Kitchen Lack of Transportation (Non-Medical):   Physical Activity:   . Days of Exercise per Week:   . Minutes of Exercise per Session:   Stress:   . Feeling of Stress :   Social Connections:   . Frequency  of Communication with Friends and Family:   .  Frequency of Social Gatherings with Friends and Family:   . Attends Religious Services:   . Active Member of Clubs or Organizations:   . Attends Archivist Meetings:   Marland Kitchen Marital Status:     Review of Systems: Gen: Denies fever, chills, cold or flulike symptoms, lightheadedness, dizziness, presyncope, syncope. CV: Denies chest pain or palpitations. Resp: Denies dyspnea.  Occasional cough. GI: See HPI Heme: See HPI  Physical Exam: BP (!) 155/61   Pulse 86   Temp 98.3 F (36.8 C) (Oral)   Ht 5\' 5"  (1.651 m)   Wt 142 lb 3.2 oz (64.5 kg)   BMI 23.66 kg/m  General:   Alert and oriented. No distress noted. Pleasant and cooperative.  Head:  Normocephalic and atraumatic. Eyes:  Conjuctiva clear without scleral icterus. Heart:  S1, S2 present without murmurs appreciated. Lungs:  Clear to auscultation bilaterally. No wheezes, rales, or rhonchi. No distress.  Abdomen:  +BS, soft, non-tender and non-distended. No rebound or guarding. No HSM or masses noted. Msk:  Symmetrical without gross deformities. Normal posture. Extremities:  Without edema. Neurologic:  Alert and  oriented x4 Psych:  Normal mood and affect.

## 2020-03-31 ENCOUNTER — Encounter: Payer: Self-pay | Admitting: Gastroenterology

## 2020-03-31 ENCOUNTER — Other Ambulatory Visit: Payer: Self-pay

## 2020-03-31 ENCOUNTER — Ambulatory Visit (INDEPENDENT_AMBULATORY_CARE_PROVIDER_SITE_OTHER): Payer: Medicare Other | Admitting: Gastroenterology

## 2020-03-31 ENCOUNTER — Telehealth: Payer: Self-pay | Admitting: Gastroenterology

## 2020-03-31 VITALS — BP 155/61 | HR 86 | Temp 98.3°F | Ht 65.0 in | Wt 142.2 lb

## 2020-03-31 DIAGNOSIS — R197 Diarrhea, unspecified: Secondary | ICD-10-CM | POA: Diagnosis not present

## 2020-03-31 DIAGNOSIS — D509 Iron deficiency anemia, unspecified: Secondary | ICD-10-CM | POA: Diagnosis not present

## 2020-03-31 NOTE — Assessment & Plan Note (Addendum)
79 year old female with acute onset of frequent watery diarrhea in February 2021.  CT A/P revealed nonspecific colitis in March 2021.  She was treated with antibiotics with no significant improvement.  C. difficile and GI pathogen panel were negative.  Additional evaluation included celiac serologies which were negative.  TSH within normal limits.  Recent colonoscopy 01/23/2020 with multiple diverticula in the sigmoid and descending colon, otherwise normal exam.  Segmental biopsies revealed collagenous colitis.  There have been issues getting Entocort covered through her insurance.  Prescription was sent to [facility] and advised to use a good Rx card but patient never obtained medication.  States " good Rx card was not in their system".  She did not let us know about this.  She has continued on Colestid daily rather than twice daily and Imodium once every morning then as needed with total of up to 3 Imodium daily.  Typically with no BM until after dinner which resultes in 4-5 watery stools before bedtime (improved).  No nocturnal stools.  Denies BRBPR, melena, abdominal pain or any other significant GI symptoms.  Weight loss seems to have stabilized over the last 2 months.  Appetite seems improved. No NSAIDs other than baby aspirin.   Overall, suspect diarrhea secondary to collagenous colitis.  We need to find a way to get her started on Entocort.  Per last documentation, and appeal was filed for PA denial but was still in process.  Notably, hemoglobin was slightly low at 11.8 in March 2021 with iron panel borderline for iron deficiency anemia-iron 48 and ferritin 19, both on the low end of normal, saturation ratio 14% (L). This is stable since February 2020 and could very well be secondary to collagenous colitis. Would like to update iron panel and hemoglobin at this time. May need to consider EGD in the future. Will need to discuss with patient once labs are updated.    Update iron panel and CBC.  Will also  update BMP with history of hypokalemia that is likely been secondary to ongoing diarrhea. She is currently taking OTC potassium supplement.  Requested nursing staff follow-up appeal for Entocort PA denial.  I also requested nurses to check into other pharmacies that patient may be able to obtain Entocort from for now. Patient was advised to continue Colestid and resume taking evening dose.  She can take cholesterol medicine at 5 PM, Colestid at 6 or 7 PM with dinner, and benazepril at 11 PM at bedtime. Continue to use Imodium as needed for breakthrough diarrhea. Continue to follow a low-fat diet. Continue to avoid dairy products or take Lactaid tablets prior to dairy consumption. Add protein shakes using almond milk. Follow-up in 2 months.

## 2020-03-31 NOTE — Telephone Encounter (Signed)
Please let patient know after additional review of her chart after she left the office yesterday, I would like for her to have CBC, iron panel, and BMP completed. She had mild anemia in March 2021 and borderline low iron panel in April. She has also has some trouble intermittent with low potassium. As diarrhea is improving somewhat, hopefully, potassium has remained within normal limits, but I would like to go ahead and update this.

## 2020-03-31 NOTE — Patient Instructions (Addendum)
Please resume taking Colestid twice daily.  In general, be sure to take other medications either 1 hour before or 4 hours after Colestid. Continue taking the morning dose as you are with breakfast. Take your evening dose with dinner at 6 or 7 PM.  Take your cholesterol medicine at 5 PM.  Continue taking benazepril at bedtime around 11 PM.  Continue taking Imodium as needed.  We will work on trying to figure out how to get Entocort for you to help treat collagenous colitis.  Continue following a low-fat diet.  Continue to avoid dairy products or take Lactaid tablets prior to consuming dairy.  I recommend you add protein shakes daily.  You can buy protein powder and add this to almond milk as this is lactose-free.   We will plan to see you back in 2 months.  Call with questions or concerns prior.  Aliene Altes, PA-C [facility]

## 2020-03-31 NOTE — Assessment & Plan Note (Addendum)
Mild normocytic anemia with hemoglobin 11.8 in March 2021.  Labs completed in the setting of new onset diarrhea with CT revealing nonspecific colitis.  Updated iron panel in April 2021 with iron 48 and ferritin 19, both on the low end of normal, saturation ratio 14% (L).  Notably, iron panel findings are stable since February 2020 with ferritin 15 and saturation 13% at that time.  Recent testing for celiac disease negative.  Colonoscopy updated 01/23/2020 due to persistent diarrhea with diverticula in the sigmoid and descending colon, otherwise normal-appearing colon.  Segmental biopsies revealed collagenous colitis.  Unfortunately, patient has not obtained budesonide due to difficulty with insurance.  She had been advised to obtain medication from [facility] with good Rx card, but she was unsuccessful with this and did not let us know. She denies any overt GI bleeding or significant upper GI symptoms.  She is taking omeprazole twice a week. Had felt her appetite was decreased when diarrhea was more severe but this is improving and weight loss has stabilized. No NSAIDs other than baby aspirin. She does have history of antral ulcer in 2005.  Last EGD in 2006 with distal esophageal erosions consistent with erosive reflux esophagitis, previously noted ulcer healed.  As patient denies any overt GI bleeding, her appetite is improving, and her weight loss is stabilizing, it is possible that her upper GI symptoms were related to diarrhea/acute colitis in March 2021. Mild IDA can occur secondary to collagenous colitis.   Will plan to update CBC and iron panel to evaluate for any worsening. May very well need to consider EGD in the future to rule out upper GI source although I feel this is somewhat less likely as labs have been fairly stable over the last year. Will discuss further once labs result. Suspect she will need to start on oral iron.  Will have nursing staff follow-up on approval for Entocort.  Will also request  nursing staff to check into other pharmacies that patient may be able to obtain Entocort from for now. Follow-up in office in 2 months.

## 2020-04-01 ENCOUNTER — Other Ambulatory Visit: Payer: Self-pay

## 2020-04-01 DIAGNOSIS — Z8639 Personal history of other endocrine, nutritional and metabolic disease: Secondary | ICD-10-CM

## 2020-04-01 DIAGNOSIS — D649 Anemia, unspecified: Secondary | ICD-10-CM

## 2020-04-01 NOTE — Telephone Encounter (Signed)
Spoke with pt. Pt will have labs completed at [facility] per pts request. Lab orders were placed and mailed to pt.

## 2020-04-06 ENCOUNTER — Telehealth: Payer: Self-pay | Admitting: Gastroenterology

## 2020-04-06 NOTE — Telephone Encounter (Signed)
Michele Putt, do we have any updates on approval for Entocort? Did patient decide on a pharmacy that she would like to try to get the budesonide from in the meantime? Magda Paganini had given her a few options prior to leaving the office on 7/12.  Also, please remind patient about having labs completed.

## 2020-04-07 NOTE — Telephone Encounter (Signed)
Noted. Were you able to talk with the patient to remind her about labs and regarding whether or not I need to send the Rx to another pharmacy or if she had the Rx transferred?

## 2020-04-07 NOTE — Telephone Encounter (Signed)
Spoke with [name]. From pts insurance company. They are still having technical difficulties with their system. I've been trying to get this info for a long.  I was asked to call them back another day.

## 2020-04-08 NOTE — Telephone Encounter (Signed)
Noted. I assume they still have the prior Rx I sent. Can you call her on Friday to follow-up with her?

## 2020-04-08 NOTE — Telephone Encounter (Signed)
Noted, will look into this on Friday 04/11/20.

## 2020-04-08 NOTE — Telephone Encounter (Signed)
Spoke with pt. Pt was reminded to get her labs completed and we discussed getting her medication at [facility]. Pt says [facility] doesn't accept [employer]. I called [facility] and they do accept the coupons. I printed a GoodRx coupon for [employer] and mailed it to pt. Pt is aware that [facility] said they would accept her coupon.

## 2020-04-09 LAB — CBC WITH DIFFERENTIAL/PLATELET
Basophils Absolute: 0.1 10*3/uL (ref 0.0–0.2)
Basos: 1 %
EOS (ABSOLUTE): 0.1 10*3/uL (ref 0.0–0.4)
Eos: 1 %
Hematocrit: 38.3 % (ref 34.0–46.6)
Hemoglobin: 12.4 g/dL (ref 11.1–15.9)
Immature Grans (Abs): 0 10*3/uL (ref 0.0–0.1)
Immature Granulocytes: 0 %
Lymphocytes Absolute: 2.6 10*3/uL (ref 0.7–3.1)
Lymphs: 29 %
MCH: 28.5 pg (ref 26.6–33.0)
MCHC: 32.4 g/dL (ref 31.5–35.7)
MCV: 88 fL (ref 79–97)
Monocytes Absolute: 0.7 10*3/uL (ref 0.1–0.9)
Monocytes: 7 %
Neutrophils Absolute: 5.5 10*3/uL (ref 1.4–7.0)
Neutrophils: 62 %
Platelets: 345 10*3/uL (ref 150–450)
RBC: 4.35 x10E6/uL (ref 3.77–5.28)
RDW: 13 % (ref 11.7–15.4)
WBC: 9 10*3/uL (ref 3.4–10.8)

## 2020-04-09 LAB — BASIC METABOLIC PANEL
BUN/Creatinine Ratio: 16 (ref 12–28)
BUN: 16 mg/dL (ref 8–27)
CO2: 24 mmol/L (ref 20–29)
Calcium: 10.2 mg/dL (ref 8.7–10.3)
Chloride: 100 mmol/L (ref 96–106)
Creatinine, Ser: 0.98 mg/dL (ref 0.57–1.00)
GFR calc Af Amer: 64 mL/min/{1.73_m2} (ref >59)
GFR calc non Af Amer: 55 mL/min/{1.73_m2} — ABNORMAL LOW (ref >59)
Glucose: 102 mg/dL — ABNORMAL HIGH (ref 65–99)
Potassium: 5.1 mmol/L (ref 3.5–5.2)
Sodium: 138 mmol/L (ref 134–144)

## 2020-04-09 LAB — IRON,TIBC AND FERRITIN PANEL
Ferritin: 32 ng/mL (ref 15–150)
Iron Saturation: 20 % (ref 15–55)
Iron: 73 ug/dL (ref 27–139)
Total Iron Binding Capacity: 361 ug/dL (ref 250–450)
UIBC: 288 ug/dL (ref 118–369)

## 2020-04-10 NOTE — Telephone Encounter (Signed)
Spoke with pt and she is suppose to contact [facility] to fill medication. When I called [facility] earlier this week, they told me that they take GoodRx cards. Pt says she still has the card I mailed to her previously. Will check back with pt.

## 2020-04-10 NOTE — Progress Notes (Signed)
Iron panel is within normal limits.  Has improved to 12.4 which is within normal limits.  Kidney function improved and now within normal limits.  Her electrolytes are within normal limits.  Potassium is 5.1, upper limit of normal.  Please let her know that she can stop taking her oral potassium for now.

## 2020-04-15 ENCOUNTER — Telehealth: Payer: Self-pay | Admitting: Internal Medicine

## 2020-04-15 NOTE — Telephone Encounter (Signed)
I am glad she was able to get the medication. I do not understand why it was still so expensive with the Good Rx card. She should start to notice improvement in her diarrhea over the next several days. She should be taking 9mg  daily. Ideally we will treat for 8 weeks and taper off there after if she is doing well.   She should call with a progress report in 4 weeks.

## 2020-04-15 NOTE — Telephone Encounter (Signed)
Spoke with pt. Pt picked up the Budesonide. She took her GoodRx coupon and says the pharmacy said the cost would still be over $200.00. pt paid the cost. Pt is aware that I haven't heard back from her insurance company.

## 2020-04-15 NOTE — Telephone Encounter (Signed)
[phone]  PLEASE CALL PATIENT, SHE HAS A QUESTION ABOUT HER MEDICATION

## 2020-04-15 NOTE — Telephone Encounter (Signed)
Noted. Pt is aware of [place] recommendations. Will f/u with pt in 4 weeks.

## 2020-04-25 DIAGNOSIS — M25562 Pain in left knee: Secondary | ICD-10-CM | POA: Insufficient documentation

## 2020-04-25 IMAGING — MG DIGITAL SCREENING BILATERAL MAMMOGRAM WITH TOMO AND CAD
8 series · 8 of 24 positions shown · non-contrast
Comparison: Previous exam(s).

CLINICAL DATA: Screening.

EXAM:
DIGITAL SCREENING BILATERAL MAMMOGRAM WITH TOMO AND CAD

[L CC synth-2D]
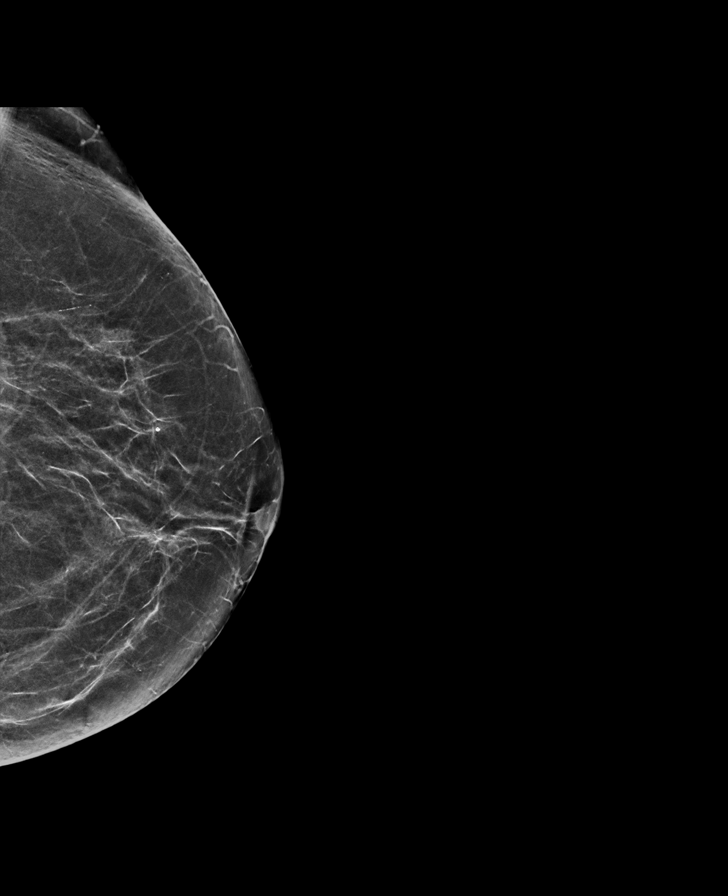

[R MLO synth-2D]
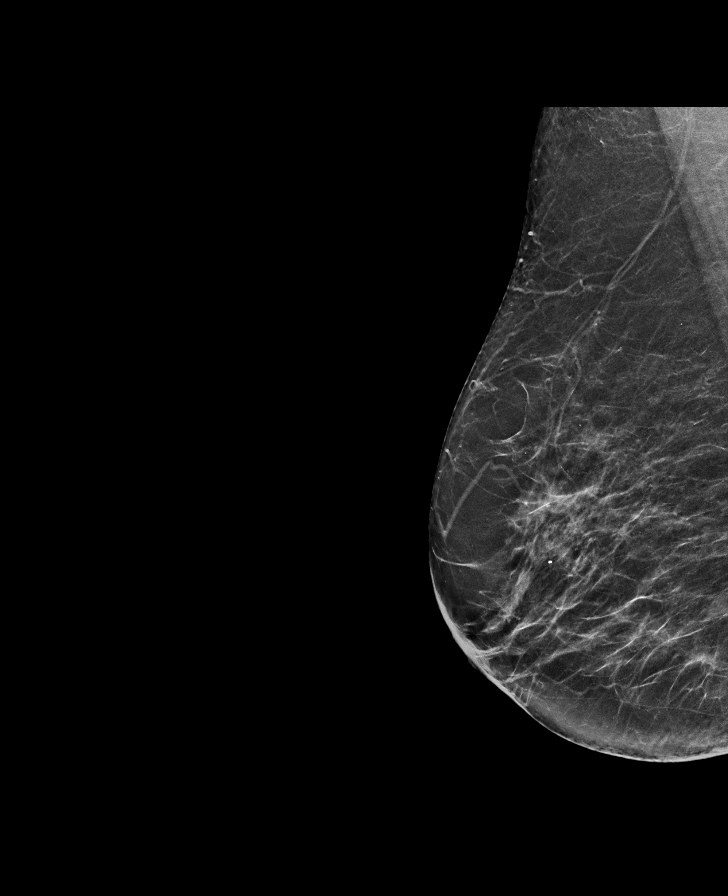

[L MLO synth-2D]
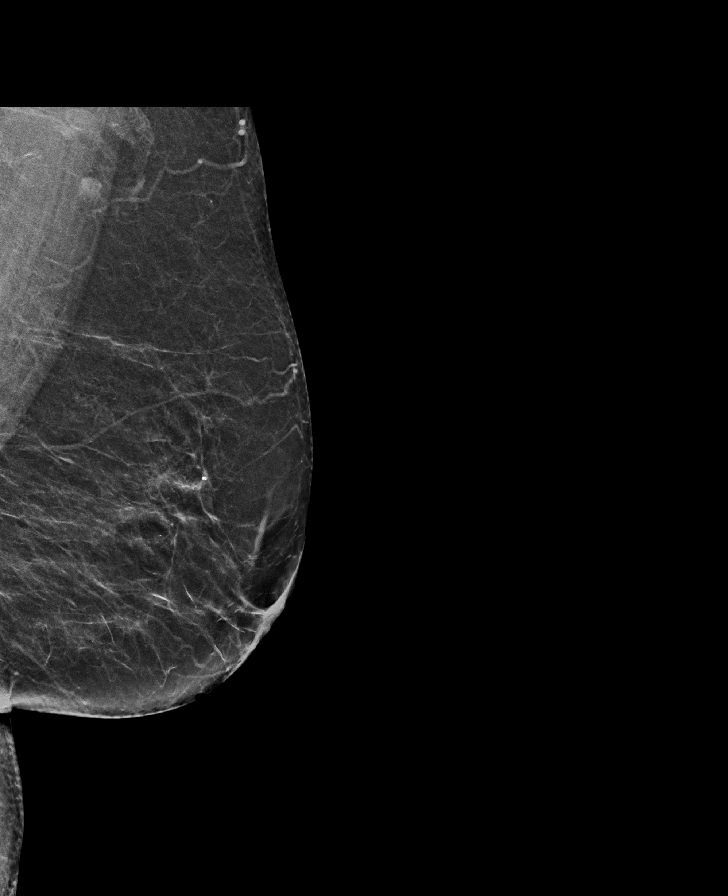

[R CC synth-2D]
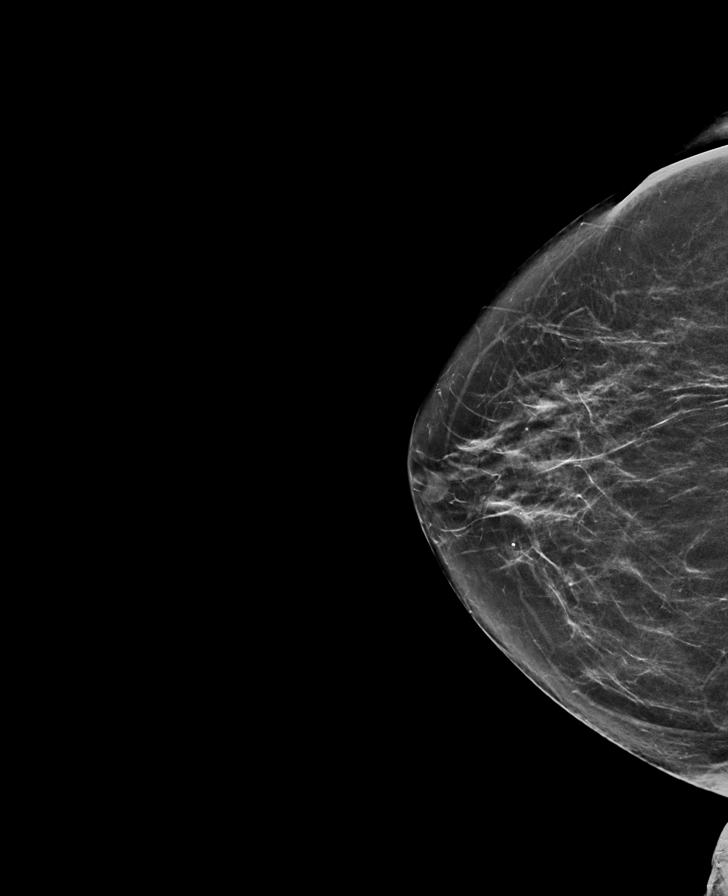

[R MLO tomo · tomo slice 30/59.0]
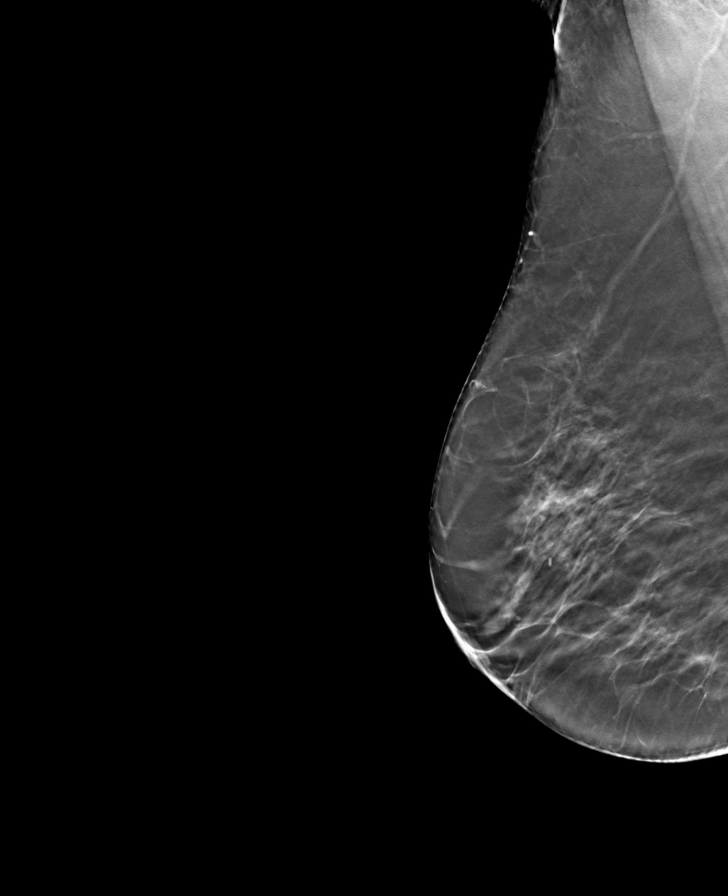

[R CC tomo · tomo slice 30/59.0]
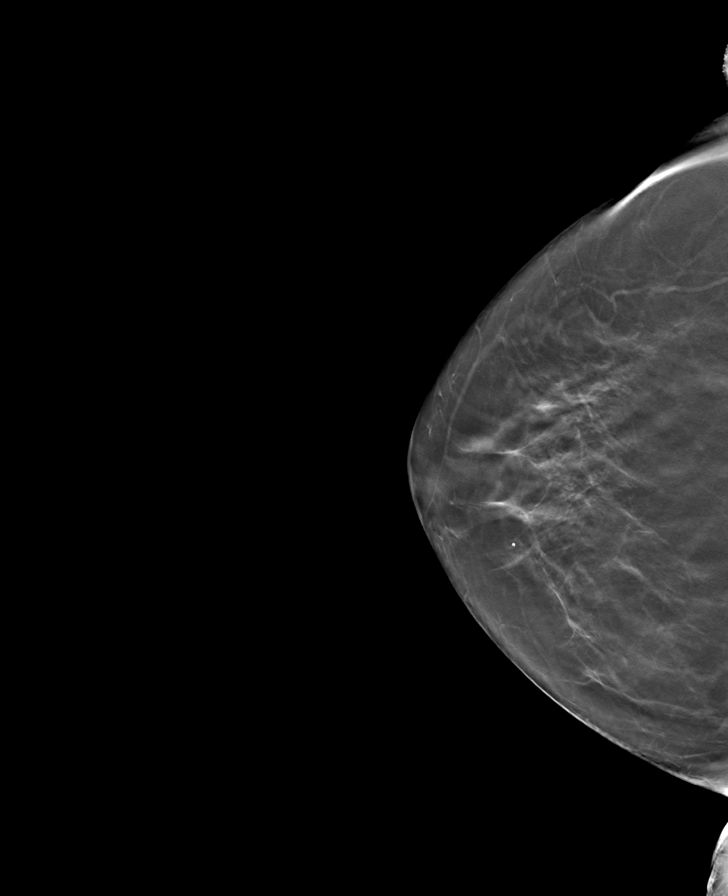

[L CC tomo · tomo slice 30/59.0]
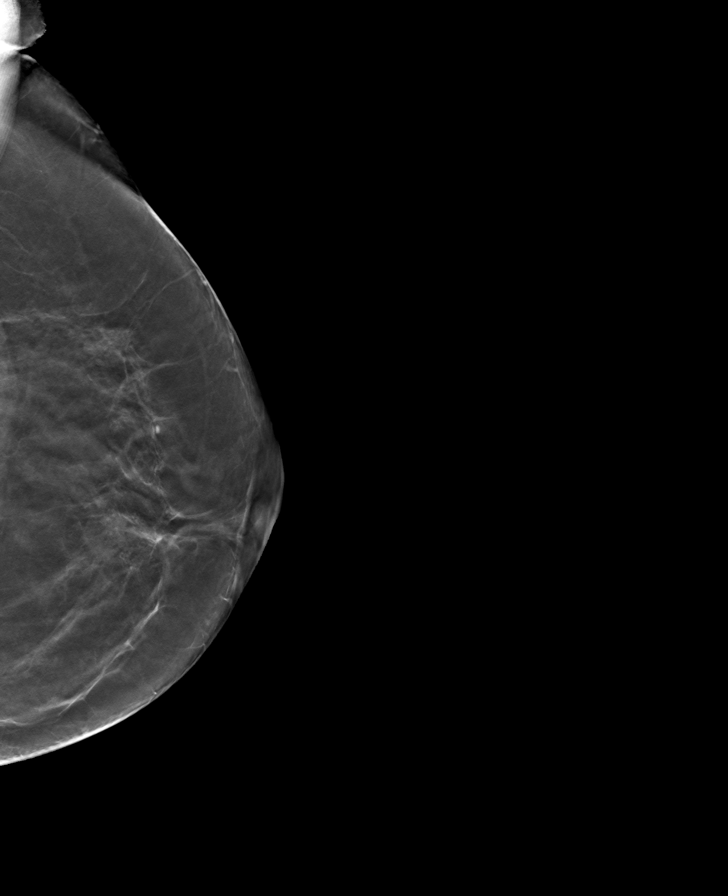

[L MLO tomo · tomo slice 30/59.0]
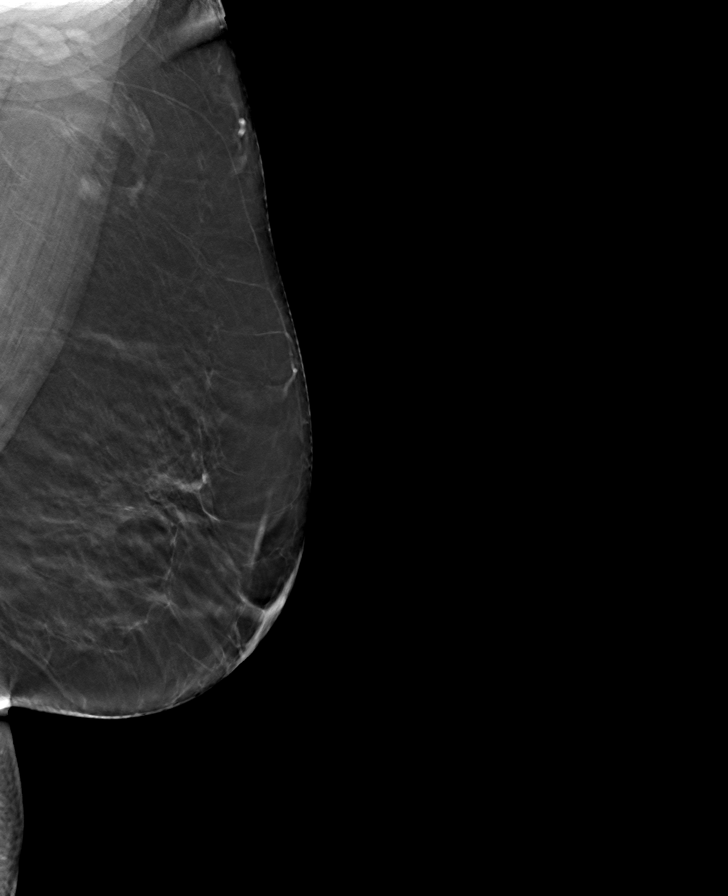

[8 of 24 positions shown; findings below may reference images not displayed]

ACR Breast Density Category b: There are scattered areas of
fibroglandular density.
FINDINGS: There are no findings suspicious for malignancy. Images were
processed with CAD.
IMPRESSION: No mammographic evidence of malignancy. A result letter of this
screening mammogram will be mailed directly to the patient.

RECOMMENDATION:
Screening mammogram in one year. (Code:[id number])

BI-RADS CATEGORY  1: Negative.

## 2020-04-30 ENCOUNTER — Encounter (HOSPITAL_COMMUNITY): Payer: Medicare Other

## 2020-04-30 ENCOUNTER — Other Ambulatory Visit: Payer: Self-pay

## 2020-04-30 ENCOUNTER — Ambulatory Visit (HOSPITAL_COMMUNITY)
Admission: RE | Admit: 2020-04-30 | Discharge: 2020-04-30 | Disposition: A | Discharge status: Home / Self Care | Payer: Medicare Other | Source: Ambulatory Visit | Attending: Cardiology | Admitting: Cardiology

## 2020-04-30 DIAGNOSIS — I6523 Occlusion and stenosis of bilateral carotid arteries: Secondary | ICD-10-CM | POA: Insufficient documentation

## 2020-05-02 ENCOUNTER — Other Ambulatory Visit: Payer: Self-pay

## 2020-05-02 DIAGNOSIS — I6529 Occlusion and stenosis of unspecified carotid artery: Secondary | ICD-10-CM

## 2020-05-04 ENCOUNTER — Telehealth: Payer: Self-pay | Admitting: Gastroenterology

## 2020-05-04 NOTE — Telephone Encounter (Signed)
[name], can we go ahead and follow-up with patient on her diarrhea since starting Budesonide 9mg  daily? She should be completing her 3rd week.   Have we heard back from her insurance company?   We are going to need to treat for at least 8 weeks and ideally follow by a taper. If we have not heard from her insurance company, we may need to try reaching out to [place] as they can compound Budesonide. I think it is somewhere around $100. Please see if patient would be agreeable to this if we have not received approval through her insurance company.

## 2020-05-06 NOTE — Telephone Encounter (Signed)
Spoke with pt. Pt says her diarrhea is doing a lot better. Pt had a bad day yesterday and had 5 bowel movements. Pt says the 5 BM is still better than having 10 BMS daily. Pt has less than 5 BMS on the other days.  I haven't heard anything from pts insurance company. Pt hasn't heard anything either. Discussed [employer] and pt is ok with paying that price $100.00 from them. Pt says that's less than she paid for the original RX.

## 2020-05-06 NOTE — Telephone Encounter (Signed)
Routing to Kristen Harper, PA °

## 2020-05-06 NOTE — Telephone Encounter (Signed)
Called in budesonide 9mg  daily x4 weeks to [employer]. This should be enough for her to complete 8 weeks of 9 mg daily and will get her to her next office visit. We will reassess at that time and hopefully start to taper the medication.   The cost will be about $45. They will reach out to patient when it is ready for pick up.

## 2020-05-06 NOTE — Telephone Encounter (Signed)
Spoke with pt. Pt is aware that [facility] will call her when the medication is ready and the cost is around $45.00.

## 2020-06-03 NOTE — Progress Notes (Signed)
Referring Provider: Lemmie Evens, MD Primary Care Physician:  Lemmie Evens, MD Primary GI Physician: Dr. Gala Romney  Chief Complaint  Patient presents with  . Diarrhea    once in a while will have episode. On budesonide 9mg  once a day and it has helps    HPI:   [name] is a 79 y.o. female presenting today for follow-up of diarrhea.  History of acute colitis on CT in March 2021 with colonic wall thickening greatest at the sigmoid colon s/p treatment with Cipro and Flagyl.  Despite antibiotics, she had persistent diarrhea with negatives C. difficile and GI pathogen panel.  Celiac serologies negative.  TSH within normal limits.  She was tried on several medications including Colestid, Imodium, and Bentyl and ultimately underwent colonoscopy in May 2021 revealing collagenous colitis.  Unfortunately, patient had trouble obtaining budesonide.  Her last visit in July 2021, she still has not obtained Entocort.  Felt her diarrhea was improved somewhat but was not sure what medication she was taking.  She was describing what sounded like taking Colestid once in the morning, 1 Imodium in the morning, and 2 Imodium in the evening with about 4-5 watery BMs only after dinner.  She is avoiding dairy and following a low-fat diet.  Has been losing weight when diarrhea was more severe but seemed weight was fairly stable over the last 3 months.  Appetite was improving. She continued smoking. No overt GI bleeding. Regarding iron deficiency anemia, last labs in March 2021 with hemoglobin 11.8 and iron panel borderline for iron deficiency anemia-iron 48 and ferritin 19, both on the low end of normal, saturation ratio 14% (L). This was stable since February 2020 and could very well be secondary to collagenous colitis. Plan to update CBC, BMP, and iron panel, follow-up on obtaining Entocort denial appeal,  Increase Colestid to twice daily and continue imodium as needed for now, continue low lat/lactose free diet,  add protein shakes using almond milk.   Labs 04/08/20: Hemoglobin improved to 12.4. Iron panel within normal limits. K upper limit of normal at 5.1. Advised she could discontinue her oral potassium.   Patient obtained Budesonide on 7/27. Plan for 8 weeks of treatment with a taper thereafter. On August 17th, patient stated diarrhea was improving. Ultimately decided to call in compounded budesonide to [facility].   Today:  May have 4-5 BMs a day once a week. Will take an imodium on these days.  Otherwise, she is having 1-2 BMs a day.  Typically soft and formed.  States the second stools may get a little loose at times.  No abdominal pain.  Weight has been stable over the last 4 months.  Eating better.  Not a big protein eater.  Was drinking ensure, but ran out 2-3 weeks.  Also notes this is very expensive.  No nausea or vomiting. No GERD symptoms. No dysphagia. No blood in the stool or black stool.   Past Medical History:  Diagnosis Date  . Arthritis   . Carotid artery disease ([place])   . Collagenous colitis 01/2020  . COPD (chronic obstructive pulmonary disease) ([place])   . Dysrhythmia    palpitations  . GERD (gastroesophageal reflux disease)   . Hypercholesteremia   . Hypertension   . Hypothyroidism   . Shortness of breath     Past Surgical History:  Procedure Laterality Date  . ABDOMINAL HYSTERECTOMY    . APPENDECTOMY    . BACK SURGERY  2019  . BIOPSY  01/23/2020   Procedure: BIOPSY;  Surgeon: Daneil Dolin, MD;  Location: [facility];  Service: Endoscopy;;  ascending, desending colon, sigmoid  . BREAST BIOPSY Left 05/04/2017   Procedure: BREAST BIOPSY WITH NEEDLE LOCALIZATION;  Surgeon: Aviva Signs, MD;  Location: [facility];  Service: General;  Laterality: Left;  needle loc at 8:00  . CATARACT EXTRACTION W/PHACO Left 09/30/2014   Procedure: CATARACT EXTRACTION PHACO AND INTRAOCULAR LENS PLACEMENT LEFT EYE;  Surgeon: Tonny Branch, MD;  Location: [facility];  Service: Ophthalmology;   Laterality: Left;  CDE:8.79  . CATARACT EXTRACTION W/PHACO Right 10/17/2014   Procedure: CATARACT EXTRACTION PHACO AND INTRAOCULAR LENS PLACEMENT RIGHT EYE;  Surgeon: Tonny Branch, MD;  Location: [facility];  Service: Ophthalmology;  Laterality: Right;  CDE: 7.80  . CHOLECYSTECTOMY N/A 04/30/2013   Procedure: Attempted LAPAROSCOPIC CHOLECYSTECTOMY ; converted to open procedure @ 1510;  Surgeon: Scherry Ran, MD;  Location: [facility];  Service: General;  Laterality: N/A;  . CHOLECYSTECTOMY N/A 04/30/2013   Procedure: CHOLECYSTECTOMY;  Surgeon: Scherry Ran, MD;  Location: [facility];  Service: General;  Laterality: N/A;  procedure started @ 1520  . COLONOSCOPY  06/11/2004   External hemorrhoids, few scattered left-sided diverticula, otherwise normal.  Repeat colonoscopy in 10 years.  . COLONOSCOPY N/A 01/23/2020   Procedure: COLONOSCOPY;  Surgeon: Daneil Dolin, MD;  Multiple small largemouth diverticula in the sigmoid and descending colon, otherwise normal exam s/p segmental biopsies of right and left colon for histologic study.  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  Marland Kitchen ESOPHAGOGASTRODUODENOSCOPY  06/11/2004   Distal esophageal erosions consistent with mild erosive reflux esophagitis, heaped up adenomatous appearing prepyloric antral mucosa biopsy, single antral prepyloric ulcer biopsied, normal D1 and D2.  . ESOPHAGOGASTRODUODENOSCOPY  10/07/2004   Distal esophageal erosions consistent with erosive reflux esophagitis, otherwise normal esophagus, normal stomach, previously noted ulcer healed completely, normal D1 and D2.  Marland Kitchen LAMINECTOMY  12/26/2017   spine surgery Dr Carloyn Manner  . TUBAL LIGATION      Current Outpatient Medications  Medication Sig Dispense Refill  . acetaminophen (TYLENOL) 500 MG tablet Take 1,000 mg by mouth every 6 (six) hours as needed.    Marland Kitchen acyclovir (ZOVIRAX) 200 MG capsule Take 200 mg by mouth 5 (five) times daily as needed (fever blisters).     Marland Kitchen albuterol  (PROVENTIL HFA;VENTOLIN HFA) 108 (90 BASE) MCG/ACT inhaler Inhale 2 puffs into the lungs every 6 (six) hours as needed for wheezing or shortness of breath.    . ALPRAZolam (XANAX) 0.5 MG tablet Take 0.25 mg by mouth daily as needed for anxiety or sleep.     . Ascorbic Acid (VITAMIN C) 1000 MG tablet Take 1,000 mg by mouth daily.    Marland Kitchen aspirin EC 81 MG tablet Take 81 mg by mouth 2 (two) times a week. Wednesday and sundays    . atorvastatin (LIPITOR) 40 MG tablet Take 40 mg by mouth daily at 6 PM.     . benazepril (LOTENSIN) 10 MG tablet Take 10 mg by mouth at bedtime.     . budesonide (ENTOCORT EC) 3 MG 24 hr capsule Take 3 capsules (9 mg total) by mouth daily. 90 capsule 1  . cholecalciferol (VITAMIN D3) 25 MCG (1000 UNIT) tablet Take 1,000 Units by mouth daily.    Marland Kitchen gabapentin (NEURONTIN) 300 MG capsule Take 300 mg by mouth 3 (three) times daily.     . hydrochlorothiazide (MICROZIDE) 12.5 MG capsule Take 12.5 mg by mouth daily.     Marland Kitchen  levothyroxine (SYNTHROID, LEVOTHROID) 88 MCG tablet Take 88 mcg by mouth daily before breakfast.    . loperamide (IMODIUM) 2 MG capsule Take 2-4 mg by mouth as needed for diarrhea or loose stools.     Marland Kitchen MELATONIN PO Take 3 mg by mouth at bedtime.    . Multiple Vitamins-Minerals (PRESERVISION AREDS 2 PO) Take 1 tablet by mouth daily.     Marland Kitchen omeprazole (PRILOSEC) 20 MG capsule Take 20 mg by mouth 2 (two) times a week. Wednesday and Sundays    . Potassium 99 MG TABS Take 99 mg by mouth daily.    . vitamin B-12 (CYANOCOBALAMIN) 1000 MCG tablet Take 1,000 mcg by mouth every other day.     No current facility-administered medications for this visit.   Facility-Administered Medications Ordered in Other Visits  Medication Dose Route Frequency Provider Last Rate Last Admin  . fentaNYL (SUBLIMAZE) injection 25-50 mcg  25-50 mcg Intravenous Q5 min PRN Lerry Liner, MD        Allergies as of 06/04/2020 - Review Complete 06/04/2020  Allergen Reaction Noted  . Codeine  Nausea And Vomiting 04/26/2013    Family History  Problem Relation Age of Onset  . Heart failure Mother   . Cancer Father   . Colon cancer Neg Hx   . Inflammatory bowel disease Neg Hx     Social History   Socioeconomic History  . Marital status: Married    Spouse name: Not on file  . Number of children: Not on file  . Years of education: Not on file  . Highest education level: Not on file  Occupational History  . Not on file  Tobacco Use  . Smoking status: Current Every Day Smoker    Packs/day: 0.25    Years: 55.00    Pack years: 13.75    Types: Cigarettes  . Smokeless tobacco: Never Used  . Tobacco comment: 4-5 cigs/day  Vaping Use  . Vaping Use: Never used  Substance and Sexual Activity  . Alcohol use: No  . Drug use: No  . Sexual activity: Yes    Birth control/protection: Surgical  Other Topics Concern  . Not on file  Social History Narrative  . Not on file   Social Determinants of Health   Financial Resource Strain:   . Difficulty of Paying Living Expenses: Not on file  Food Insecurity:   . Worried About Charity fundraiser in the Last Year: Not on file  . Ran Out of Food in the Last Year: Not on file  Transportation Needs:   . Lack of Transportation (Medical): Not on file  . Lack of Transportation (Non-Medical): Not on file  Physical Activity:   . Days of Exercise per Week: Not on file  . Minutes of Exercise per Session: Not on file  Stress:   . Feeling of Stress : Not on file  Social Connections:   . Frequency of Communication with Friends and Family: Not on file  . Frequency of Social Gatherings with Friends and Family: Not on file  . Attends Religious Services: Not on file  . Active Member of Clubs or Organizations: Not on file  . Attends Archivist Meetings: Not on file  . Marital Status: Not on file    Review of Systems: Gen: Denies fever, chills, cold or flulike symptoms, presyncope, syncope. CV: Denies chest pain or  palpitations. Resp: Denies dyspnea or cough. GI: See HPI Heme: See HPI  Physical Exam: BP 131/63   Pulse  83   Temp 97.6 F (36.4 C) (Oral)   Ht 5\' 5"  (1.651 m)   Wt 141 lb 9.6 oz (64.2 kg)   BMI 23.56 kg/m  General:   Alert and oriented. No distress noted. Pleasant and cooperative.  Head:  Normocephalic and atraumatic. Eyes:  Conjuctiva clear without scleral icterus. Heart:  S1, S2 present without murmurs appreciated. Lungs:  Clear to auscultation bilaterally. No wheezes, rales, or rhonchi. No distress.  Abdomen:  +BS, soft, non-tender and non-distended. No rebound or guarding. No HSM or masses noted. Msk:  Symmetrical without gross deformities. Normal posture. Extremities:  Without edema. Neurologic:  Alert and  oriented x4 Psych: Normal mood and affect.

## 2020-06-04 ENCOUNTER — Encounter: Payer: Self-pay | Admitting: Gastroenterology

## 2020-06-04 ENCOUNTER — Other Ambulatory Visit: Payer: Self-pay

## 2020-06-04 ENCOUNTER — Ambulatory Visit (INDEPENDENT_AMBULATORY_CARE_PROVIDER_SITE_OTHER): Payer: Medicare Other | Admitting: Gastroenterology

## 2020-06-04 ENCOUNTER — Telehealth: Payer: Self-pay | Admitting: Gastroenterology

## 2020-06-04 VITALS — BP 131/63 | HR 83 | Temp 97.6°F | Ht 65.0 in | Wt 141.6 lb

## 2020-06-04 DIAGNOSIS — K52831 Collagenous colitis: Secondary | ICD-10-CM | POA: Diagnosis not present

## 2020-06-04 NOTE — Patient Instructions (Signed)
Continue taking budesonide 9 mg daily for now.  I am sending an additional 4 weeks to [facility].  After you complete the additional 4 weeks of 9 mg daily, we will plan to taper the medication.  Please call me with a progress report in 4 weeks.  Please add protein shakes twice daily.  You may buy the protein powder over-the-counter and make smoothies or milkshakes.  Continue following a low-fat diet.  If you would like to consume dairy, you may try this.  Monitor for any return of diarrhea.  If this occurs, you may take Lactaid tablets prior to consuming dairy.  Be sure to avoid all NSAIDs including ibuprofen, Aleve, Advil, Goody powders, aspirin powders, naproxen, and anything that says "NSAID" on the package.  Aliene Altes, PA-C [facility]

## 2020-06-04 NOTE — Assessment & Plan Note (Signed)
Patient presents today for follow-up of diarrhea.  She underwent extensive evaluation and ultimately was diagnosed with collagenous colitis in May 2021.  We have tried her on several medications including Bentyl, Colestid, and Imodium due to difficulties obtaining budesonide.  Patient was finally able to obtain budesonide 7/27.  She is currently on her seventh week of budesonide 9 mg daily and notes significant improvement in diarrhea.  Typically with 1-2 BMs daily.  She may have 1 day a week with 4-5 BMs that are loose and may get watery.  She will take 1 Imodium on these days.  No alarm symptoms.  No other significant GI symptoms.  Notably, patient had been losing weight when her diarrhea was more severe.  Her weight has been stable over the last 4 months.  Plan: 1.  We will plan for an additional 4 weeks of budesonide 9 mg daily to complete 12 weeks.  We will then plan to taper budesonide.  Patient is currently receiving compounded budesonide through [facility].  I will call in a prescription.  2.  I have requested patient to call our office with a progress report in 4 weeks.  If she is continuing to do well, I will send in an updated prescription for a taper.  3.  Continue to follow a low-fat diet. 4.  May try consuming some dairy as she requested.  If this causes diarrhea, she will take Lactaid tablets prior to dairy consumption. 5.  Advise she buy protein powder and create shakes or smoothies twice daily as she reports eating very little protein on a regular basis.   6.  Continue to avoid NSAIDs. 7.  We will plan to follow-up in the office in 2 months.

## 2020-06-04 NOTE — Telephone Encounter (Signed)
[name], call patient to see if she is or is not taking potassium?  As seem to have looked over this on her medication list while she was in the office.  On her last labs in July 2021, potassium was within normal limits and I recommended she stop potassium.  I do not think she needs daily potassium as her diarrhea is improving.  We can have her update a BMP to be sure.

## 2020-06-05 ENCOUNTER — Other Ambulatory Visit: Payer: Self-pay

## 2020-06-05 DIAGNOSIS — Z8639 Personal history of other endocrine, nutritional and metabolic disease: Secondary | ICD-10-CM

## 2020-06-05 NOTE — Telephone Encounter (Signed)
Noted. Agree with recommendations.  

## 2020-06-05 NOTE — Telephone Encounter (Signed)
Spoke with pt. Pt isn't taking Potassium. Pt d/c potassium as directed 03/2020. Pt is having some leg cramps and wondered what she should take for it. I discussed with pt that she needs to discuss the leg cramps with her PCP. Per pts request lab order will be mailed to her.

## 2020-06-13 ENCOUNTER — Other Ambulatory Visit: Payer: Self-pay | Admitting: Gastroenterology

## 2020-06-14 LAB — BASIC METABOLIC PANEL
BUN/Creatinine Ratio: 18 (ref 12–28)
BUN: 19 mg/dL (ref 8–27)
CO2: 28 mmol/L (ref 20–29)
Calcium: 10.2 mg/dL (ref 8.7–10.3)
Chloride: 98 mmol/L (ref 96–106)
Creatinine, Ser: 1.03 mg/dL — ABNORMAL HIGH (ref 0.57–1.00)
GFR calc Af Amer: 60 mL/min/{1.73_m2} (ref >59)
GFR calc non Af Amer: 52 mL/min/{1.73_m2} — ABNORMAL LOW (ref >59)
Glucose: 152 mg/dL — ABNORMAL HIGH (ref 65–99)
Potassium: 5.2 mmol/L (ref 3.5–5.2)
Sodium: 138 mmol/L (ref 134–144)

## 2020-06-15 NOTE — Progress Notes (Signed)
Glucose is elevated at 152. Creatinine is elevated at 1.03 which indicates slight decline in kidney function. This could be in the setting of mild dehydration. Sodium within normal limits. Potassium within normal limits but on the very upper limit of normal at 5.2.  Recommendations:  1. She will need to follow-up with her primary care on elevated glucose. She does not have a history of diabetes that I am aware of and this will need to be evaluated further. She should avoid sweets, sodas, fruit juices, sweetened beverages like tea, etc. Watch the amount of cars/breads she is consuming.  2. Follow-up with PCP on slight decline in kidney function and to keep a check on her potassium.3. Increase water intake. Drink enough water to keep urine pale yellow to clear. 3. Do not take any potassium supplement.  [name]: Please fax results to PCP.

## 2020-06-23 ENCOUNTER — Other Ambulatory Visit (HOSPITAL_COMMUNITY): Payer: Self-pay | Admitting: Family Medicine

## 2020-06-23 DIAGNOSIS — Z1231 Encounter for screening mammogram for malignant neoplasm of breast: Secondary | ICD-10-CM

## 2020-06-26 ENCOUNTER — Telehealth: Payer: Self-pay | Admitting: Gastroenterology

## 2020-06-26 NOTE — Telephone Encounter (Signed)
[name], can you call patient to see how she is doing with budesonide 9mg  daily? At her last OV, we planned to complete 4 more weeks of 9mg  daily and hope to taper thereafter. She should be starting week 4 at this point.   Please ask how many BMs she is having per day and if she is having any watery BMs.

## 2020-06-27 NOTE — Telephone Encounter (Signed)
That is great! She should continue Budesone 9 mg daily for 1 more week. We will then decrease to 6 mg daily for at least 2 weeks.   Does she need another Rx for Budesonide? If so, I will call in a Rx to [place].    If she has return of watery stools or frequent Bms with decreasing to 6mg , she should call and let me know.  Regardless, I would like for her to call me in 3 weeks with a progress report.

## 2020-06-27 NOTE — Telephone Encounter (Signed)
Spoke with pt. Pt is taking Entocort 9 mg daily. Pt is having 2 BM's daily. Pt seldom has days were she has 4 or 5 Bm's. Pt states she is doing much better. Stools are not watery. Stools are soft and formed per pt.

## 2020-06-27 NOTE — Telephone Encounter (Signed)
Lmom, waiting on a return call.  

## 2020-07-01 NOTE — Telephone Encounter (Signed)
Spoke with pts spouse. Pt will call back when she returns home.

## 2020-07-01 NOTE — Telephone Encounter (Signed)
Pt returned call. Please send RX in per pt. Pt is going to continue Budesonide 9 mg as directed for 1 week and then decrease to 6 mg x 2 weeks. Pt will call back with a progress report in 3 weeks and if diarrhea starts.

## 2020-07-02 ENCOUNTER — Ambulatory Visit (HOSPITAL_COMMUNITY)
Admission: RE | Admit: 2020-07-02 | Discharge: 2020-07-02 | Disposition: A | Discharge status: Home / Self Care | Payer: Medicare Other | Source: Ambulatory Visit | Attending: Family Medicine | Admitting: Family Medicine

## 2020-07-02 ENCOUNTER — Other Ambulatory Visit: Payer: Self-pay

## 2020-07-02 DIAGNOSIS — Z1231 Encounter for screening mammogram for malignant neoplasm of breast: Secondary | ICD-10-CM | POA: Diagnosis present

## 2020-07-02 NOTE — Telephone Encounter (Signed)
Rx for budesonide 6mg  x 2 weeks followed by 3 mg x 2 weeks has been called into [place]. Again, she should call back if she has return of diarrhea while she is tapering off budesonide.

## 2020-07-02 NOTE — Telephone Encounter (Signed)
Noted. Pt was advised 07/01/2020 to call back if diarrhea returns or if pt has any questions or concerns.

## 2020-07-05 ENCOUNTER — Ambulatory Visit: Payer: Medicare Other | Attending: Internal Medicine

## 2020-07-05 DIAGNOSIS — Z23 Encounter for immunization: Secondary | ICD-10-CM

## 2020-07-05 NOTE — Progress Notes (Signed)
   Covid-[facility] Vaccination [facility]  Name:  [name]    MRN: [id number] DOB: 1940/10/14  07/05/2020  [name] was observed post Covid-19 immunization for 15 minutes without incident. She was provided with Vaccine Information Sheet and instruction to access the V-Safe system.   [name] was instructed to call 911 with any severe reactions post vaccine: Marland Kitchen Difficulty breathing  . Swelling of face and throat  . A fast heartbeat  . A bad rash all over body  . Dizziness and weakness

## 2020-07-24 ENCOUNTER — Telehealth: Payer: Self-pay | Admitting: Internal Medicine

## 2020-07-24 NOTE — Telephone Encounter (Signed)
Pt was calling to give Derrick Ravel, CMA a 3 week follow up report. [phone]

## 2020-07-24 NOTE — Telephone Encounter (Signed)
[phone]  Patient returned call, please call back

## 2020-07-24 NOTE — Telephone Encounter (Signed)
Lmom for pt to call me back. 

## 2020-07-25 NOTE — Telephone Encounter (Signed)
I have called in a Rx to [facility] for compounded budesonide 3 mg x 14 days.

## 2020-07-25 NOTE — Telephone Encounter (Signed)
Spoke with patient. She is doing well. Having 1-2 Bms daily. No watery stools. Has 2 pills of 6 mg budesonide left which will complete 14 days of 6 mg. We will plan to decrease to 3 mg x14 days. Requested she call back in 2 weeks with a progress report.

## 2020-07-25 NOTE — Telephone Encounter (Signed)
Called pt back and sent progress report to Aliene Altes, [place].  See other note.

## 2020-07-25 NOTE — Telephone Encounter (Signed)
Called pt and she said that she is doing much better on medication since we cut dosage to Budesonide 6 mg.  Pt wants to know if you want her to continue on this dosage.  Can we send new RX to [place]?  Has 2 pills left.

## 2020-08-02 NOTE — Progress Notes (Signed)
Referring Provider: Lemmie Evens, MD Primary Care Physician:  Lemmie Evens, MD Primary GI Physician: Dr. Gala Romney  Chief Complaint  Patient presents with  . colitis    doing ok    HPI:   [name] is a 79 y.o. female presenting today for follow-up of collagenous colitis. History of acute colitis on CT in March 2021 with colonic wall thickening greatest at the sigmoid colon s/p treatment with Cipro and Flagyl.  Despite antibiotics, she had persistent diarrhea with negatives C. difficile and GI pathogen panel.  Celiac serologies negative.  TSH within normal limits.  She was tried on several medications including Colestid, Imodium, and Bentyl and ultimately underwent colonoscopy in May 2021 revealing collagenous colitis.  She is on exam the office 06/04/2020.  She had finally been able to obtain compounded budesonide from [facility] 7/27.  She was having 4-5 BMs a day about once a week, otherwise 1-2 soft, formed BMs daily.  Weight was stable.  She is eating better.  Plan for an additional 4 weeks of budesonide at 9 mg daily then start to taper.  06/27/2020: Patient reported she is doing much better, typically with 2 BMs daily, rarely with 4-5 BMs.  Plan for budesonide 9 mg x 1 week then decrease to 6 mg daily.  07/24/2020: Patient was doing very well on budesonide 6 mg daily with 1-2 BMs daily, no watery stools.  Plan to decrease to 3 mg daily x14 days, but hopefully discontinue budesonide.  Today: Taking budesonide 3 mg daily. Having 1-2 BMs daily. No watery stools. Stools are mushy. No blood in the stool or black stools. No abdominal pain. No nausea or vomiting. Eating well. Has gained 7 pounds in the last 2 months. No GERD symptoms or dysphagia. No NSAIDs other than baby aspirin.    Past Medical History:  Diagnosis Date  . Arthritis   . Carotid artery disease ([place])   . Collagenous colitis 01/2020  . COPD (chronic obstructive pulmonary disease) ([place])   . Dysrhythmia     palpitations  . GERD (gastroesophageal reflux disease)   . Hypercholesteremia   . Hypertension   . Hypothyroidism   . Shortness of breath     Past Surgical History:  Procedure Laterality Date  . ABDOMINAL HYSTERECTOMY    . APPENDECTOMY    . BACK SURGERY  2019  . BIOPSY  01/23/2020   Procedure: BIOPSY;  Surgeon: Daneil Dolin, MD;  Location: [facility];  Service: Endoscopy;;  ascending, desending colon, sigmoid  . BREAST BIOPSY Left 05/04/2017   Procedure: BREAST BIOPSY WITH NEEDLE LOCALIZATION;  Surgeon: Aviva Signs, MD;  Location: [facility] ORS;  Service: General;  Laterality: Left;  needle loc at 8:00  . CATARACT EXTRACTION W/PHACO Left 09/30/2014   Procedure: CATARACT EXTRACTION PHACO AND INTRAOCULAR LENS PLACEMENT LEFT EYE;  Surgeon: Tonny Branch, MD;  Location: [facility] ORS;  Service: Ophthalmology;  Laterality: Left;  CDE:8.79  . CATARACT EXTRACTION W/PHACO Right 10/17/2014   Procedure: CATARACT EXTRACTION PHACO AND INTRAOCULAR LENS PLACEMENT RIGHT EYE;  Surgeon: Tonny Branch, MD;  Location: [facility] ORS;  Service: Ophthalmology;  Laterality: Right;  CDE: 7.80  . CHOLECYSTECTOMY N/A 04/30/2013   Procedure: Attempted LAPAROSCOPIC CHOLECYSTECTOMY ; converted to open procedure @ 1510;  Surgeon: Scherry Ran, MD;  Location: [facility] ORS;  Service: General;  Laterality: N/A;  . CHOLECYSTECTOMY N/A 04/30/2013   Procedure: CHOLECYSTECTOMY;  Surgeon: Scherry Ran, MD;  Location: [facility] ORS;  Service: General;  Laterality: N/A;  procedure started @ 1520  . COLONOSCOPY  06/11/2004   External hemorrhoids, few scattered left-sided diverticula, otherwise normal.  Repeat colonoscopy in 10 years.  . COLONOSCOPY N/A 01/23/2020   Procedure: COLONOSCOPY;  Surgeon: Daneil Dolin, MD;  Multiple small largemouth diverticula in the sigmoid and descending colon, otherwise normal exam s/p segmental biopsies of right and left colon for histologic study.  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  Marland Kitchen  ESOPHAGOGASTRODUODENOSCOPY  06/11/2004   Distal esophageal erosions consistent with mild erosive reflux esophagitis, heaped up adenomatous appearing prepyloric antral mucosa biopsy, single antral prepyloric ulcer biopsied, normal D1 and D2.  . ESOPHAGOGASTRODUODENOSCOPY  10/07/2004   Distal esophageal erosions consistent with erosive reflux esophagitis, otherwise normal esophagus, normal stomach, previously noted ulcer healed completely, normal D1 and D2.  Marland Kitchen LAMINECTOMY  12/26/2017   spine surgery Dr Carloyn Manner  . TUBAL LIGATION      Current Outpatient Medications  Medication Sig Dispense Refill  . acetaminophen (TYLENOL) 500 MG tablet Take 1,000 mg by mouth every 6 (six) hours as needed.    Marland Kitchen acyclovir (ZOVIRAX) 200 MG capsule Take 200 mg by mouth 5 (five) times daily as needed (fever blisters).     Marland Kitchen albuterol (PROVENTIL HFA;VENTOLIN HFA) 108 (90 BASE) MCG/ACT inhaler Inhale 2 puffs into the lungs every 6 (six) hours as needed for wheezing or shortness of breath.    . ALPRAZolam (XANAX) 0.5 MG tablet Take 0.25 mg by mouth daily as needed for anxiety or sleep.     . Ascorbic Acid (VITAMIN C) 1000 MG tablet Take 1,000 mg by mouth daily.    Marland Kitchen aspirin EC 81 MG tablet Take 81 mg by mouth 2 (two) times a week. Wednesday and sundays    . atorvastatin (LIPITOR) 40 MG tablet Take 40 mg by mouth daily at 6 PM.     . benazepril (LOTENSIN) 10 MG tablet Take 10 mg by mouth at bedtime.     . budesonide (ENTOCORT EC) 3 MG 24 hr capsule Take 3 capsules (9 mg total) by mouth daily. (Patient taking differently: Take 3 mg by mouth daily. ) 90 capsule 1  . cholecalciferol (VITAMIN D3) 25 MCG (1000 UNIT) tablet Take 1,000 Units by mouth daily.    . hydrochlorothiazide (MICROZIDE) 12.5 MG capsule Take 12.5 mg by mouth daily.     Marland Kitchen levothyroxine (SYNTHROID, LEVOTHROID) 88 MCG tablet Take 88 mcg by mouth daily before breakfast.    . MAGNESIUM PO Take 1 tablet by mouth daily.    Marland Kitchen MELATONIN PO Take 3 mg by mouth at  bedtime.    . Multiple Vitamins-Minerals (PRESERVISION AREDS 2 PO) Take 1 tablet by mouth daily.     Marland Kitchen omeprazole (PRILOSEC) 20 MG capsule Take 20 mg by mouth 2 (two) times a week. Wednesday and Sundays    . predniSONE (DELTASONE) 10 MG tablet Take 1 tablet by mouth daily.    . vitamin B-12 (CYANOCOBALAMIN) 1000 MCG tablet Take 1,000 mcg by mouth every other day.    . loperamide (IMODIUM) 2 MG capsule Take 2-4 mg by mouth as needed for diarrhea or loose stools.  (Patient not taking: Reported on 08/04/2020)     No current facility-administered medications for this visit.   Facility-Administered Medications Ordered in Other Visits  Medication Dose Route Frequency Provider Last Rate Last Admin  . fentaNYL (SUBLIMAZE) injection 25-50 mcg  25-50 mcg Intravenous Q5 min PRN Lerry Liner, MD        Allergies as of 08/04/2020 -  Review Complete 08/04/2020  Allergen Reaction Noted  . Codeine Nausea And Vomiting 04/26/2013    Family History  Problem Relation Age of Onset  . Heart failure Mother   . Cancer Father   . Colon cancer Neg Hx   . Inflammatory bowel disease Neg Hx     Social History   Socioeconomic History  . Marital status: Married    Spouse name: Not on file  . Number of children: Not on file  . Years of education: Not on file  . Highest education level: Not on file  Occupational History  . Not on file  Tobacco Use  . Smoking status: Current Every Day Smoker    Packs/day: 0.25    Years: 55.00    Pack years: 13.75    Types: Cigarettes  . Smokeless tobacco: Never Used  . Tobacco comment: 5-6 cigs/day  Vaping Use  . Vaping Use: Never used  Substance and Sexual Activity  . Alcohol use: No  . Drug use: No  . Sexual activity: Yes    Birth control/protection: Surgical  Other Topics Concern  . Not on file  Social History Narrative  . Not on file   Social Determinants of Health   Financial Resource Strain:   . Difficulty of Paying Living Expenses: Not on file   Food Insecurity:   . Worried About Charity fundraiser in the Last Year: Not on file  . Ran Out of Food in the Last Year: Not on file  Transportation Needs:   . Lack of Transportation (Medical): Not on file  . Lack of Transportation (Non-Medical): Not on file  Physical Activity:   . Days of Exercise per Week: Not on file  . Minutes of Exercise per Session: Not on file  Stress:   . Feeling of Stress : Not on file  Social Connections:   . Frequency of Communication with Friends and Family: Not on file  . Frequency of Social Gatherings with Friends and Family: Not on file  . Attends Religious Services: Not on file  . Active Member of Clubs or Organizations: Not on file  . Attends Archivist Meetings: Not on file  . Marital Status: Not on file    Review of Systems: Gen: Denies fever, chills, cold or flulike symptoms, lightheadedness, dizziness, presyncope, syncope. CV: Denies chest pain or palpitations. Resp: Denies dyspnea or cough. GI: See HPI Heme: See HPI  Physical Exam: BP (!) 162/64   Pulse 74   Temp (!) 96.9 F (36.1 C) (Temporal)   Ht 5\' 5"  (1.651 m)   Wt 148 lb 9.6 oz (67.4 kg)   BMI 24.73 kg/m  General:   Alert and oriented. No distress noted. Pleasant and cooperative.  Head:  Normocephalic and atraumatic. Eyes:  Conjuctiva clear without scleral icterus. Heart:  S1, S2 present without murmurs appreciated. Lungs:  Clear to auscultation bilaterally. No wheezes, rales, or rhonchi. No distress.  Abdomen:  +BS, soft, non-tender and non-distended. No rebound or guarding. No HSM or masses noted. Msk:  Symmetrical without gross deformities. Normal posture. Extremities:  Without edema. Neurologic:  Alert and  oriented x4 Psych: Normal mood and affect.

## 2020-08-04 ENCOUNTER — Ambulatory Visit (INDEPENDENT_AMBULATORY_CARE_PROVIDER_SITE_OTHER): Payer: Medicare Other | Admitting: Gastroenterology

## 2020-08-04 ENCOUNTER — Other Ambulatory Visit: Payer: Self-pay

## 2020-08-04 ENCOUNTER — Encounter: Payer: Self-pay | Admitting: Gastroenterology

## 2020-08-04 VITALS — BP 162/64 | HR 74 | Temp 96.9°F | Ht 65.0 in | Wt 148.6 lb

## 2020-08-04 DIAGNOSIS — K52831 Collagenous colitis: Secondary | ICD-10-CM | POA: Diagnosis not present

## 2020-08-04 NOTE — Patient Instructions (Signed)
Please continue Budesonide 3 mg daily for an additional 2 weeks, then stop medication.   We will plan to see you back in 6 months. Please let me know if your diarrhea returns after stopping Budesonide.   Aliene Altes, PA-C [facility]

## 2020-08-07 ENCOUNTER — Encounter: Payer: Self-pay | Admitting: Gastroenterology

## 2020-08-07 NOTE — Assessment & Plan Note (Signed)
Diagnosed with collagenous colitis May 2021.  Clinically, she is doing very well with 1-2 mushy BMs daily, currently on budesonide 3 mg daily.  No alarm symptoms.  She had been losing weight when her diarrhea was more severe; however, she has gained 7 pounds in the last 2 months.  We will plan to continue budesonide 3 mg daily for an additional 2 weeks and then discontinue.  Patient was advised to monitor for any return of diarrhea and let me know if this occurs.  Follow-up in 6 months.

## 2020-11-17 ENCOUNTER — Telehealth: Payer: Self-pay | Admitting: Orthopaedic Surgery

## 2020-11-17 NOTE — Telephone Encounter (Signed)
Patient called to inquire about speaking with Dr Luna Glasgow over the phone regarding leg pain and hip pain; states she had seen him a long time ago. States has also had back surgery approximately 3 years ago by Dr Carloyn Manner.  Also sees Dr Alvan Dame at [facility]; last seen in August. Discussed 2nd opinion protocol if she wishes to pursue  The scheduling of an appointment. Michela Pitcher will try reaching ortho in [place]. Also aware to contact primary care provider.

## 2020-11-18 ENCOUNTER — Telehealth: Payer: Self-pay | Admitting: Internal Medicine

## 2020-11-18 NOTE — Telephone Encounter (Signed)
According to our last note, she should have stopped budesonide around 11/29. Please verify this. If this is the case, she has been off on budesonide for about 3 months and was doing well until 2 weeks ago. Diarrhea may be secondary to flare of collagenous colitis, but we need to rule out infectious diarrhea before I resume the steroid. Please arrange C. Diff GDH, Toxin A/B and GI pathogen panel. Dx: Acute diarrhea.

## 2020-11-18 NOTE — Telephone Encounter (Signed)
Spoke with pt. Pt has had watery diarrhea for 3-5 times daily x 2 weeks. Pt is taking Imodium otc and it's not helping. Pt is taking up to 4 Imodium tabs daily. Pt would like Budesonide again. Pt feels Budesonide helped with her diarrhea. If pt restarts Budesonide, pt would like it sent into [place]. Pt isn't having any abdominal pain.

## 2020-11-18 NOTE — Telephone Encounter (Signed)
Patient called and said that her diarrhea was back and immodium is not working.  Asked if Cyril Mourning could send send something to her pharmacy, [facility] [address]

## 2020-11-19 ENCOUNTER — Other Ambulatory Visit: Payer: Self-pay

## 2020-11-19 DIAGNOSIS — R197 Diarrhea, unspecified: Secondary | ICD-10-CM

## 2020-11-19 DIAGNOSIS — Z79899 Other long term (current) drug therapy: Secondary | ICD-10-CM

## 2020-11-19 DIAGNOSIS — I6529 Occlusion and stenosis of unspecified carotid artery: Secondary | ICD-10-CM

## 2020-11-19 NOTE — Telephone Encounter (Signed)
Noted. Will await stool studies.

## 2020-11-19 NOTE — Telephone Encounter (Signed)
Pt was doing well until 2 weeks ago. Diarrhea started back 2 weeks ago and pt felt she get it to stop and it hasn't. Pt has been taking the Imodium 4 times daily with no help.   I will arrange stool testing.

## 2020-11-19 NOTE — Telephone Encounter (Signed)
Noted  

## 2020-11-27 LAB — C. DIFFICILE GDH AND TOXIN A/B
GDH ANTIGEN: NOT DETECTED
MICRO NUMBER:: 11616651
SPECIMEN QUALITY:: ADEQUATE
TOXIN A AND B: NOT DETECTED

## 2020-11-27 LAB — GASTROINTESTINAL PATHOGEN PANEL PCR
C. difficile Tox A/B, PCR: NOT DETECTED
Campylobacter, PCR: NOT DETECTED
Cryptosporidium, PCR: NOT DETECTED
E coli (ETEC) LT/ST PCR: NOT DETECTED
E coli (STEC) stx1/stx2, PCR: NOT DETECTED
E coli 0157, PCR: NOT DETECTED
Giardia lamblia, PCR: NOT DETECTED
Norovirus, PCR: NOT DETECTED
Rotavirus A, PCR: NOT DETECTED
Salmonella, PCR: NOT DETECTED
Shigella, PCR: NOT DETECTED

## 2020-12-04 ENCOUNTER — Encounter: Payer: Self-pay | Admitting: Orthopaedic Surgery

## 2020-12-04 ENCOUNTER — Other Ambulatory Visit: Payer: Self-pay

## 2020-12-04 ENCOUNTER — Ambulatory Visit (INDEPENDENT_AMBULATORY_CARE_PROVIDER_SITE_OTHER): Payer: Medicare Other | Admitting: Orthopaedic Surgery

## 2020-12-04 VITALS — BP 158/57 | HR 73 | Ht 65.0 in | Wt 148.4 lb

## 2020-12-04 DIAGNOSIS — F17210 Nicotine dependence, cigarettes, uncomplicated: Secondary | ICD-10-CM

## 2020-12-04 DIAGNOSIS — I6529 Occlusion and stenosis of unspecified carotid artery: Secondary | ICD-10-CM

## 2020-12-04 DIAGNOSIS — S42035A Nondisplaced fracture of lateral end of left clavicle, initial encounter for closed fracture: Secondary | ICD-10-CM

## 2020-12-04 MED ORDER — HYDROCODONE-ACETAMINOPHEN 5-325 MG PO TABS: ORAL_TABLET | ORAL | 0 refills | Status: DC

## 2020-12-04 NOTE — Progress Notes (Signed)
Patient ID:[name], female DOB:March 29, 1941, 80 y.o. NOB:[id number]  Chief Complaint  Patient presents with  . Shoulder Pain    L/DOI:12/02/20 lost balance while trying on clothes.    HPI  [name] is a 80 y.o. female who fell at home two days ago and hurt her left shoulder.  She was seen at [facility].  I have reviewed the notes and x-rays.  She has a small nondisplaced fracture of the left clavicle distally.  She was given Relafen and a sling.  She has no other injuries.  She is in some pain. She had no head injury.   Body mass index is 24.69 kg/m.  ROS  Review of Systems  Constitutional: Positive for activity change.  Respiratory: Positive for shortness of breath.   Musculoskeletal: Positive for arthralgias.  All other systems reviewed and are negative.   All other systems reviewed and are negative.  The following is a summary of the past history medically, past history surgically, known current medicines, social history and family history.  This information is gathered electronically by the computer from prior information and documentation.  I review this each visit and have found including this information at this point in the chart is beneficial and informative.    Past Medical History:  Diagnosis Date  . Arthritis   . Carotid artery disease ([place])   . Collagenous colitis 01/2020  . COPD (chronic obstructive pulmonary disease) ([place])   . Dysrhythmia    palpitations  . GERD (gastroesophageal reflux disease)   . Hypercholesteremia   . Hypertension   . Hypothyroidism   . Shortness of breath     Past Surgical History:  Procedure Laterality Date  . ABDOMINAL HYSTERECTOMY    . APPENDECTOMY    . BACK SURGERY  2019  . BIOPSY  01/23/2020   Procedure: BIOPSY;  Surgeon: Daneil Dolin, MD;  Location: [facility];  Service: Endoscopy;;  ascending, desending colon, sigmoid  . BREAST BIOPSY Left 05/04/2017   Procedure: BREAST BIOPSY WITH NEEDLE LOCALIZATION;   Surgeon: Aviva Signs, MD;  Location: [facility] ORS;  Service: General;  Laterality: Left;  needle loc at 8:00  . CATARACT EXTRACTION W/PHACO Left 09/30/2014   Procedure: CATARACT EXTRACTION PHACO AND INTRAOCULAR LENS PLACEMENT LEFT EYE;  Surgeon: Tonny Branch, MD;  Location: [facility] ORS;  Service: Ophthalmology;  Laterality: Left;  CDE:8.79  . CATARACT EXTRACTION W/PHACO Right 10/17/2014   Procedure: CATARACT EXTRACTION PHACO AND INTRAOCULAR LENS PLACEMENT RIGHT EYE;  Surgeon: Tonny Branch, MD;  Location: [facility] ORS;  Service: Ophthalmology;  Laterality: Right;  CDE: 7.80  . CHOLECYSTECTOMY N/A 04/30/2013   Procedure: Attempted LAPAROSCOPIC CHOLECYSTECTOMY ; converted to open procedure @ 1510;  Surgeon: Scherry Ran, MD;  Location: [facility] ORS;  Service: General;  Laterality: N/A;  . CHOLECYSTECTOMY N/A 04/30/2013   Procedure: CHOLECYSTECTOMY;  Surgeon: Scherry Ran, MD;  Location: [facility] ORS;  Service: General;  Laterality: N/A;  procedure started @ 1520  . COLONOSCOPY  06/11/2004   External hemorrhoids, few scattered left-sided diverticula, otherwise normal.  Repeat colonoscopy in 10 years.  . COLONOSCOPY N/A 01/23/2020   Procedure: COLONOSCOPY;  Surgeon: Daneil Dolin, MD;  Multiple small largemouth diverticula in the sigmoid and descending colon, otherwise normal exam s/p segmental biopsies of right and left colon for histologic study.  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  Marland Kitchen ESOPHAGOGASTRODUODENOSCOPY  06/11/2004   Distal esophageal erosions consistent with mild erosive reflux esophagitis, heaped up adenomatous appearing prepyloric antral  mucosa biopsy, single antral prepyloric ulcer biopsied, normal D1 and D2.  . ESOPHAGOGASTRODUODENOSCOPY  10/07/2004   Distal esophageal erosions consistent with erosive reflux esophagitis, otherwise normal esophagus, normal stomach, previously noted ulcer healed completely, normal D1 and D2.  Marland Kitchen LAMINECTOMY  12/26/2017   spine surgery Dr Carloyn Manner  . TUBAL  LIGATION      Family History  Problem Relation Age of Onset  . Heart failure Mother   . Cancer Father   . Colon cancer Neg Hx   . Inflammatory bowel disease Neg Hx     Social History Social History   Tobacco Use  . Smoking status: Current Every Day Smoker    Packs/day: 0.25    Years: 55.00    Pack years: 13.75    Types: Cigarettes  . Smokeless tobacco: Never Used  . Tobacco comment: 5-6 cigs/day  Vaping Use  . Vaping Use: Never used  Substance Use Topics  . Alcohol use: No  . Drug use: No    Allergies  Allergen Reactions  . Codeine Nausea And Vomiting    Current Outpatient Medications  Medication Sig Dispense Refill  . HYDROcodone-acetaminophen (NORCO/VICODIN) 5-325 MG tablet One tablet every four hours as needed for acute pain.  Limit of five days per [place] statue. 30 tablet 0  . acetaminophen (TYLENOL) 500 MG tablet Take 1,000 mg by mouth every 6 (six) hours as needed.    Marland Kitchen acyclovir (ZOVIRAX) 200 MG capsule Take 200 mg by mouth 5 (five) times daily as needed (fever blisters).     Marland Kitchen albuterol (PROVENTIL HFA;VENTOLIN HFA) 108 (90 BASE) MCG/ACT inhaler Inhale 2 puffs into the lungs every 6 (six) hours as needed for wheezing or shortness of breath.    . ALPRAZolam (XANAX) 0.5 MG tablet Take 0.25 mg by mouth daily as needed for anxiety or sleep.     . Ascorbic Acid (VITAMIN C) 1000 MG tablet Take 1,000 mg by mouth daily.    Marland Kitchen aspirin EC 81 MG tablet Take 81 mg by mouth 2 (two) times a week. Wednesday and sundays    . atorvastatin (LIPITOR) 40 MG tablet Take 40 mg by mouth daily at 6 PM.     . benazepril (LOTENSIN) 10 MG tablet Take 10 mg by mouth at bedtime.     . budesonide (ENTOCORT EC) 3 MG 24 hr capsule Take 3 capsules (9 mg total) by mouth daily. (Patient taking differently: Take 3 mg by mouth daily. ) 90 capsule 1  . cholecalciferol (VITAMIN D3) 25 MCG (1000 UNIT) tablet Take 1,000 Units by mouth daily.    . hydrochlorothiazide (MICROZIDE) 12.5 MG capsule Take  12.5 mg by mouth daily.     Marland Kitchen levothyroxine (SYNTHROID, LEVOTHROID) 88 MCG tablet Take 88 mcg by mouth daily before breakfast.    . loperamide (IMODIUM) 2 MG capsule Take 2-4 mg by mouth as needed for diarrhea or loose stools.  (Patient not taking: Reported on 08/04/2020)    . MAGNESIUM PO Take 1 tablet by mouth daily.    Marland Kitchen MELATONIN PO Take 3 mg by mouth at bedtime.    . Multiple Vitamins-Minerals (PRESERVISION AREDS 2 PO) Take 1 tablet by mouth daily.     Marland Kitchen omeprazole (PRILOSEC) 20 MG capsule Take 20 mg by mouth 2 (two) times a week. Wednesday and Sundays    . predniSONE (DELTASONE) 10 MG tablet Take 1 tablet by mouth daily.    . vitamin B-12 (CYANOCOBALAMIN) 1000 MCG tablet Take 1,000 mcg by mouth every  other day.     No current facility-administered medications for this visit.   Facility-Administered Medications Ordered in Other Visits  Medication Dose Route Frequency Provider Last Rate Last Admin  . fentaNYL (SUBLIMAZE) injection 25-50 mcg  25-50 mcg Intravenous Q5 min PRN Lerry Liner, MD         Physical Exam  Blood pressure (!) 158/57, pulse 73, height 5\' 5"  (1.651 m), weight 148 lb 6 oz (67.3 kg).  Constitutional: overall normal hygiene, normal nutrition, well developed, normal grooming, normal body habitus. Assistive device:sling  Musculoskeletal: gait and station Limp none, muscle tone and strength are normal, no tremors or atrophy is present.  .  Neurological: coordination overall normal.  Deep tendon reflex/nerve stretch intact.  Sensation normal.  Cranial nerves II-XII intact.   Skin:   Normal overall no scars, lesions, ulcers or rashes. No psoriasis.  Psychiatric: Alert and oriented x 3.  Recent memory intact, remote memory unclear.  Normal mood and affect. Well groomed.  Good eye contact.  Cardiovascular: overall no swelling, no varicosities, no edema bilaterally, normal temperatures of the legs and arms, no clubbing, cyanosis and good capillary  refill.  Lymphatic: palpation is normal.  Left shoulder tender over distal clavicle. ROM reduced secondary to pain.  NV intact.  ROM of neck is full.  All other systems reviewed and are negative   The patient has been educated about the nature of the problem(s) and counseled on treatment options.  The patient appeared to understand what I have discussed and is in agreement with it.  Encounter Diagnoses  Name Primary?  . Closed nondisplaced fracture of acromial end of left clavicle, initial encounter Yes  . Nicotine dependence, cigarettes, uncomplicated     PLAN Call if any problems.  Precautions discussed.  Continue current medications.   Return to clinic 2 weeks   I have reviewed the [place] web site prior to prescribing narcotic medicine for this patient.   Electronically Signed Sanjuana Kava, MD 3/17/20228:09 AM

## 2020-12-04 NOTE — Patient Instructions (Signed)

## 2020-12-18 ENCOUNTER — Encounter: Payer: Self-pay | Admitting: Orthopaedic Surgery

## 2020-12-18 ENCOUNTER — Ambulatory Visit (INDEPENDENT_AMBULATORY_CARE_PROVIDER_SITE_OTHER): Payer: Medicare Other | Admitting: Orthopaedic Surgery

## 2020-12-18 ENCOUNTER — Other Ambulatory Visit: Payer: Self-pay

## 2020-12-18 ENCOUNTER — Ambulatory Visit: Payer: Medicare Other

## 2020-12-18 VITALS — BP 150/64 | HR 69 | Ht 65.0 in | Wt 146.0 lb

## 2020-12-18 DIAGNOSIS — S42035A Nondisplaced fracture of lateral end of left clavicle, initial encounter for closed fracture: Secondary | ICD-10-CM

## 2020-12-18 NOTE — Progress Notes (Signed)
I feel better  She has less pain of the left shoulder and clavicle with much better motion.  She has been using the sling. She can come out of the sling now.  NV intact.  X-rays were done of the left clavicle, reported separately.  Encounter Diagnosis  Name Primary?  . Closed nondisplaced fracture of acromial end of left clavicle, initial encounter Yes   Return in three weeks.  X-rays of clavicle on return.  Call if any problem.  Precautions discussed.   Electronically Signed Sanjuana Kava, MD 3/31/202210:05 AM

## 2020-12-22 DIAGNOSIS — E782 Mixed hyperlipidemia: Secondary | ICD-10-CM

## 2020-12-22 NOTE — Telephone Encounter (Signed)
Spoke with Ritchie, pt's son in regards to cholesterol levels and a possible statin holiday.  Ritchie would like to have cholesterol levels checked and would like to know the next step with her statin therapy, whether that be a statin holiday or different treatment, etc. Per chart pt had an LDL of 150 back in May of 2021 and at this time Dr. Gwenlyn Found recommended that the pt have a referral to the lipid clinic. Explained to Ritchie that we could check labs and set up that referral so that they could have a conversation on lipid management. Son is in agreement with this plan and verbalizes understanding. Placed orders for labs and referral to Dr. Debara Pickett in lipid clinic. Son states that his mom is resistant to having appointment in [place] with her advancing age but he is going to influence her to have this appointment with Dr. Debara Pickett.

## 2020-12-23 ENCOUNTER — Ambulatory Visit: Payer: Medicare Other | Admitting: Orthopaedic Surgery

## 2020-12-24 LAB — HEPATIC FUNCTION PANEL
ALT: 20 IU/L (ref 0–32)
AST: 18 IU/L (ref 0–40)
Albumin: 4.8 g/dL — ABNORMAL HIGH (ref 3.7–4.7)
Alkaline Phosphatase: 114 IU/L (ref 44–121)
Bilirubin Total: 0.4 mg/dL (ref 0.0–1.2)
Bilirubin, Direct: 0.12 mg/dL (ref 0.00–0.40)
Total Protein: 7.1 g/dL (ref 6.0–8.5)

## 2020-12-24 LAB — LIPID PANEL
Chol/HDL Ratio: 2.5 ratio (ref 0.0–4.4)
Cholesterol, Total: 155 mg/dL (ref 100–199)
HDL: 63 mg/dL (ref >39)
LDL Chol Calc (NIH): 69 mg/dL (ref 0–99)
Triglycerides: 130 mg/dL (ref 0–149)
VLDL Cholesterol Cal: 23 mg/dL (ref 5–40)

## 2021-01-08 ENCOUNTER — Telehealth: Payer: Self-pay

## 2021-01-08 ENCOUNTER — Other Ambulatory Visit: Payer: Self-pay | Admitting: Orthopedic Surgery

## 2021-01-08 DIAGNOSIS — M5136 Other intervertebral disc degeneration, lumbar region: Secondary | ICD-10-CM

## 2021-01-08 NOTE — Progress Notes (Signed)
Phone call to patient to verify medication list and allergies for myelogram procedure. Medications pt is currently taking are safe to continue to take. Advised pt if any new medications are started prior to procedure to call and make [place] aware. Pt also instructed to have a driver the day of the procedure, she would be with [place] around 2 hours, and discharge instructions reviewed. Pt verbalized understanding.

## 2021-01-13 ENCOUNTER — Other Ambulatory Visit: Payer: Self-pay

## 2021-01-13 ENCOUNTER — Ambulatory Visit (INDEPENDENT_AMBULATORY_CARE_PROVIDER_SITE_OTHER): Payer: Medicare Other | Admitting: Orthopaedic Surgery

## 2021-01-13 ENCOUNTER — Ambulatory Visit: Payer: Medicare Other

## 2021-01-13 ENCOUNTER — Encounter: Payer: Self-pay | Admitting: Orthopaedic Surgery

## 2021-01-13 DIAGNOSIS — S42035D Nondisplaced fracture of lateral end of left clavicle, subsequent encounter for fracture with routine healing: Secondary | ICD-10-CM

## 2021-01-13 NOTE — Progress Notes (Signed)
Referring Provider: Lemmie Evens, MD Primary Care Physician:  Lemmie Evens, MD Primary GI Physician: Dr. Gala Romney  Chief Complaint  Patient presents with  . collagenous colitis    Reports she is doing fine now. No diarrhea, no constipation, no n/v, no abd pain    HPI:   [name] is a 80 y.o. female presenting today for follow-up of collagenous colitis. History of acute colitis on CT in March 2021 with colonic wall thickening greatest at the sigmoid colon s/p treatment with Cipro and Flagyl. Despite antibiotics, she had persistent diarrhea with negatives C. difficile and GI pathogen panel. Celiac serologies negative. TSH within normal limits. She was tried on several medications including Colestid, Imodium, and Bentyl and ultimately underwent colonoscopy in May 2021 revealing collagenous colitis.   She was last seen in our office 08/04/2020.  She was down to budesonide 3 mg daily (started on 11/4) and was doing very well with 1-2 BMs daily, no watery stools.  Stools were mushy.  No alarm symptoms.  She had gained 7 pounds over the last 2 months.  Planned to continue budesonide 3 mg daily x2 weeks then discontinue.  She was advised to monitor for return of diarrhea and let me know if this occurs.  Follow-up in 6 months.  Patient called 11/18/2020 reporting return of watery diarrhea with 3-5 BMs daily x2 weeks.  Imodium not helping.  Recommended stool studies to rule out infectious diarrhea.  C. difficile and GI pathogen panel were completed 3/7, negative.  She was restarted on budesonide 9 mg daily with plans to treat x8 weeks and hopefully taper thereafter.  Today:  Diarrhea resolved. Now with BMs daily to every other day. No abdominal pain, brbpr, melena, nausea, vomiting, GERD symptoms, dysphagia. No other GI concerns.   She is taking budesonide but isn't sure what dose she is taking at this time. She will look when she gets home.   Down about 5 lbs since last visit. No change  in intake. 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 Limiting fried/fatty/greasy foods, red meats. Eating banana and crackers for breakfast. Apple for lunch. Small meal for dinner. Drinking a premier protein shake daily.   No sure why she had colitis flare. Denies medication changes, dietary changes, or taking NSAIDs aside from 81 mg aspirin twice a week.  She does take Lipitor daily.  Also continues to smoke but is down to about 10 cigarettes daily.   Past Medical History:  Diagnosis Date  . Arthritis   . Carotid artery disease ([place])   . Collagenous colitis 01/2020  . COPD (chronic obstructive pulmonary disease) ([place])   . Dysrhythmia    palpitations  . GERD (gastroesophageal reflux disease)   . Hypercholesteremia   . Hypertension   . Hypothyroidism   . Shortness of breath     Past Surgical History:  Procedure Laterality Date  . ABDOMINAL HYSTERECTOMY    . APPENDECTOMY    . BACK SURGERY  2019  . BIOPSY  01/23/2020   Procedure: BIOPSY;  Surgeon: Daneil Dolin, MD;  Location: [facility];  Service: Endoscopy;;  ascending, desending colon, sigmoid  . BREAST BIOPSY Left 05/04/2017   Procedure: BREAST BIOPSY WITH NEEDLE LOCALIZATION;  Surgeon: Aviva Signs, MD;  Location: [facility];  Service: General;  Laterality: Left;  needle loc at 8:00  . CATARACT EXTRACTION W/PHACO Left 09/30/2014   Procedure: CATARACT EXTRACTION PHACO AND INTRAOCULAR LENS PLACEMENT LEFT EYE;  Surgeon: Tonny Branch, MD;  Location:  [facility];  Service: Ophthalmology;  Laterality: Left;  CDE:8.79  . CATARACT EXTRACTION W/PHACO Right 10/17/2014   Procedure: CATARACT EXTRACTION PHACO AND INTRAOCULAR LENS PLACEMENT RIGHT EYE;  Surgeon: Tonny Branch, MD;  Location: [facility];  Service: Ophthalmology;  Laterality: Right;  CDE: 7.80  . CHOLECYSTECTOMY N/A 04/30/2013   Procedure: Attempted LAPAROSCOPIC CHOLECYSTECTOMY ; converted to open procedure @ 1510;  Surgeon: Scherry Ran, MD;  Location: [facility];  Service: General;  Laterality: N/A;  .  CHOLECYSTECTOMY N/A 04/30/2013   Procedure: CHOLECYSTECTOMY;  Surgeon: Scherry Ran, MD;  Location: [facility];  Service: General;  Laterality: N/A;  procedure started @ 1520  . COLONOSCOPY  06/11/2004   External hemorrhoids, few scattered left-sided diverticula, otherwise normal.  Repeat colonoscopy in 10 years.  . COLONOSCOPY N/A 01/23/2020   Procedure: COLONOSCOPY;  Surgeon: Daneil Dolin, MD;  Multiple small [place]outh diverticula in the sigmoid and descending colon, otherwise normal exam s/p segmental biopsies of right and left colon for histologic study.  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  Marland Kitchen ESOPHAGOGASTRODUODENOSCOPY  06/11/2004   Distal esophageal erosions consistent with mild erosive reflux esophagitis, heaped up adenomatous appearing prepyloric antral mucosa biopsy, single antral prepyloric ulcer biopsied, normal D1 and D2.  . ESOPHAGOGASTRODUODENOSCOPY  10/07/2004   Distal esophageal erosions consistent with erosive reflux esophagitis, otherwise normal esophagus, normal stomach, previously noted ulcer healed completely, normal D1 and D2.  Marland Kitchen LAMINECTOMY  12/26/2017   spine surgery Dr Carloyn Manner  . TUBAL LIGATION      Current Outpatient Medications  Medication Sig Dispense Refill  . acetaminophen (TYLENOL) 500 MG tablet Take 1,000 mg by mouth every 6 (six) hours as needed.    Marland Kitchen acyclovir (ZOVIRAX) 200 MG capsule Take 200 mg by mouth 5 (five) times daily as needed (fever blisters).     Marland Kitchen albuterol (PROVENTIL HFA;VENTOLIN HFA) 108 (90 BASE) MCG/ACT inhaler Inhale 2 puffs into the lungs every 6 (six) hours as needed for wheezing or shortness of breath.    . ALPRAZolam (XANAX) 0.5 MG tablet Take 0.25 mg by mouth daily as needed for anxiety or sleep.     . Ascorbic Acid (VITAMIN C) 1000 MG tablet Take 1,000 mg by mouth daily.    Marland Kitchen aspirin EC 81 MG tablet Take 81 mg by mouth 2 (two) times a week. Wednesday and sundays    . atorvastatin (LIPITOR) 40 MG tablet Take 40 mg  by mouth daily at 6 PM.     . benazepril (LOTENSIN) 10 MG tablet Take 10 mg by mouth at bedtime.     . budesonide (ENTOCORT EC) 3 MG 24 hr capsule Take 3 capsules (9 mg total) by mouth daily. (Patient taking differently: Take 3 mg by mouth daily.) 90 capsule 1  . cholecalciferol (VITAMIN D3) 25 MCG (1000 UNIT) tablet Take 1,000 Units by mouth daily.    . hydrochlorothiazide (MICROZIDE) 12.5 MG capsule Take 12.5 mg by mouth daily.     Marland Kitchen HYDROcodone-acetaminophen (NORCO/VICODIN) 5-325 MG tablet One tablet every four hours as needed for acute pain.  Limit of five days per [place]. (Patient taking differently: One tablet every four hours as needed for acute pain.  Limit of five days per [place] statue.) 30 tablet 0  . levothyroxine (SYNTHROID, LEVOTHROID) 88 MCG tablet Take 88 mcg by mouth daily before breakfast.    . loperamide (IMODIUM) 2 MG capsule Take 2-4 mg by mouth as needed for diarrhea or loose stools.    Marland Kitchen  MAGNESIUM PO Take 1 tablet by mouth daily.    Marland Kitchen MELATONIN PO Take 3 mg by mouth at bedtime.    . Multiple Vitamins-Minerals (PRESERVISION AREDS 2 PO) Take 1 tablet by mouth daily.    Marland Kitchen omeprazole (PRILOSEC) 20 MG capsule Take 20 mg by mouth 2 (two) times a week. Wednesday and Sundays    . vitamin B-12 (CYANOCOBALAMIN) 1000 MCG tablet Take 1,000 mcg by mouth every other day.     No current facility-administered medications for this visit.   Facility-Administered Medications Ordered in Other Visits  Medication Dose Route Frequency Provider Last Rate Last Admin  . fentaNYL (SUBLIMAZE) injection 25-50 mcg  25-50 mcg Intravenous Q5 min PRN Lerry Liner, MD        Allergies as of 01/14/2021 - Review Complete 01/14/2021  Allergen Reaction Noted  . Codeine Nausea And Vomiting 04/26/2013    Family History  Problem Relation Age of Onset  . Heart failure Mother   . Cancer Father   . Colon cancer Neg Hx   . Inflammatory bowel disease Neg Hx     Social History    Socioeconomic History  . Marital status: Married    Spouse name: Not on file  . Number of children: Not on file  . Years of education: Not on file  . Highest education level: Not on file  Occupational History  . Not on file  Tobacco Use  . Smoking status: Current Every Day Smoker    Packs/day: 0.25    Years: 55.00    Pack years: 13.75    Types: Cigarettes  . Smokeless tobacco: Never Used  . Tobacco comment: 5-6 cigs/day  Vaping Use  . Vaping Use: Never used  Substance and Sexual Activity  . Alcohol use: No  . Drug use: No  . Sexual activity: Yes    Birth control/protection: Surgical  Other Topics Concern  . Not on file  Social History Narrative  . Not on file   Social Determinants of Health   Financial Resource Strain: Not on file  Food Insecurity: Not on file  Transportation Needs: Not on file  Physical Activity: Not on file  Stress: Not on file  Social Connections: Not on file    Review of Systems: Gen: Denies fever, chills, cold or flulike symptoms, presyncope, syncope. GI: See HPI Heme: See HPI  Physical Exam: BP (!) 150/64   Pulse 71   Temp (!) 97 F (36.1 C)   Ht 5\' 5"  (1.651 m)   Wt 143 lb 3.2 oz (65 kg)   BMI 23.83 kg/m  General:   Alert and oriented. No distress noted. Pleasant and cooperative.  Head:  Normocephalic and atraumatic. Eyes:  Conjuctiva clear without scleral icterus. Heart:  S1, S2 present without murmurs appreciated. Lungs:  Clear to auscultation bilaterally. No wheezes, rales, or rhonchi. No distress.  Abdomen:  +BS, soft, non-tender and non-distended. No rebound or guarding. No HSM or masses noted. Msk:  Symmetrical without gross deformities. Normal posture. Extremities:  Without edema. Neurologic:  Alert and  oriented x4 Psych:  Normal mood and affect.   Assessment: 80 y.o. female presenting today for follow-up of collagenous colitis, first diagnosed May 2021.  She was treated with a course of budesonide and ultimately  tapered off of this in the latter part of November 2021.  She was doing very well up until 1 March when she had return of watery diarrhea with 3-5 BMs daily, not improved by Imodium.  Stool studies were  rechecked and negative.  She was restarted on budesonide 9 mg.  Clinically, she is doing very well with resolution of diarrhea. BMs daily to every other day.  No other significant upper or lower GI symptoms. She has lost about 5 lbs over the last 6 months. Unclear why. Denies any change in dietary intake though she reports she has never been a big eater. We will continue to monitor this.   It is unclear why patient had a flare of collagenous colitis, likely multifactorial.   She does continue smoking though she is working on decreasing this.  Down to about 10 cigarettes/day.  Also on aspirin twice weekly, no other NSAIDs, Lipitor daily, and omeprazole twice weekly.   *Patient was not sure what dose of budesonide she was taking at the time of her office visit.  She called our office back after she returned home.  She is taking budesonide 9 mg daily and has 23 days left.  She was dispensed 56 capsules in order to complete an 8-week course.  She will complete her current prescription.  We will then taper to 6 mg x 2 weeks, then 3 mg x 2 weeks.  Requested she call me when she is down to 1 week left of budesonide 9 mg, and I will send in the new prescription for tapering.  Notably, patient also told me when she called the office back that she has been having problems with her teeth and this is why she is not eating as much.  Recommended she stick to soft foods, but ensure she is eating at least 4-6 meals a day and drinking a couple protein shakes daily.   Plan:  1.  Complete 8-week course of budesonide 9 mg daily.  Request patient to call when she has 1 week left and I will send in a new prescription for a taper to 6 mg x 2 weeks then 3 mg x 2 weeks.  2.   Advised to discuss atorvastatin with her cardiologist to  see if there are other options to manage her cholesterol as this may influence collagenous colitis.  3.  Avoid all NSAID products.  4.  Continue working towards smoking cessation.  Advised to discuss options to help her quit with her PCP.   5.  Try eating 4-6 small meals daily.  6.  Continue drinking 1-2 protein shakes daily.  7.  Follow-up in 3 months.    Aliene Altes, PA-C [facility] 01/14/2021

## 2021-01-13 NOTE — Progress Notes (Signed)
I am fine  She has no pain of the left shoulder.  She has full ROM.  NV intact.  X-rays were done of the left clavicle, reported separately.  Encounter Diagnosis  Name Primary?  . Closed nondisplaced fracture of acromial end of left clavicle with routine healing, subsequent encounter Yes   Discharge.  Call if any problem.  Precautions discussed.   Electronically Signed Sanjuana Kava, MD 4/26/20222:11 PM

## 2021-01-14 ENCOUNTER — Telehealth: Payer: Self-pay

## 2021-01-14 ENCOUNTER — Ambulatory Visit (INDEPENDENT_AMBULATORY_CARE_PROVIDER_SITE_OTHER): Payer: Medicare Other | Admitting: Gastroenterology

## 2021-01-14 ENCOUNTER — Encounter: Payer: Self-pay | Admitting: Internal Medicine

## 2021-01-14 ENCOUNTER — Encounter: Payer: Self-pay | Admitting: Gastroenterology

## 2021-01-14 VITALS — BP 150/64 | HR 71 | Temp 97.0°F | Ht 65.0 in | Wt 143.2 lb

## 2021-01-14 DIAGNOSIS — K52831 Collagenous colitis: Secondary | ICD-10-CM

## 2021-01-14 NOTE — Telephone Encounter (Signed)
Noted. Spoke with patient. She has 23 pills left. She was dispensed 56 to complete an 8 week course. She will call when she has 1 week left and I will send in a new Rx for a taper with 6 mg x2 weeks, then 3 mg x 2 weeks.

## 2021-01-14 NOTE — Patient Instructions (Addendum)
Please look at your bottle of budesonide when you get home and let me know if you are taking 3 or 9 mg daily.  We will need to taper this down if you are still taking 9 mg daily.  Describes atorvastatin with your cardiologist to see if there are other options for you to manage your cholesterol. This medication can worsen collagenous colitis.   Continue to avoid all NSAID products.  Continue working towards smoking cessation as this too will contribute to collagenous colitis.   Discuss options with your PCP to help you stop smoking.   Try eating 4-6 small meals daily as you do not like to eat 3 main meals.   Continue drinking 1-2 protein shakes daily.   We will plan to see back in 3 months.  Please call questions or concerns prior.  Aliene Altes, PA-C [facility]astroenterology

## 2021-01-14 NOTE — Telephone Encounter (Signed)
Pt returned call states the medication is 9 mg and she is taking 1 a day.

## 2021-01-15 ENCOUNTER — Ambulatory Visit
Admission: RE | Admit: 2021-01-15 | Discharge: 2021-01-15 | Disposition: A | Discharge status: Home / Self Care | Payer: Medicare Other | Source: Ambulatory Visit | Attending: Orthopedic Surgery | Admitting: Orthopedic Surgery

## 2021-01-15 DIAGNOSIS — M5136 Other intervertebral disc degeneration, lumbar region: Secondary | ICD-10-CM

## 2021-01-15 MED ORDER — DIAZEPAM 5 MG PO TABS
5.0000 mg | ORAL_TABLET | Freq: Once | ORAL | Status: AC
  Administered 2021-01-15: 5 mg via ORAL

## 2021-01-15 MED ORDER — IOPAMIDOL (ISOVUE-M 200) INJECTION 41%
15.0000 mL | Freq: Once | INTRAMUSCULAR | Status: AC
  Administered 2021-01-15: 15 mL via INTRATHECAL

## 2021-01-15 NOTE — Discharge Instructions (Signed)
Myelogram Discharge Instructions  1. Go home and rest quietly as needed. You may resume normal activities; however, do not exert yourself strongly or do any heavy lifting today and tomorrow.   2. DO NOT drive today.    3. You may resume your normal diet and medications unless otherwise indicated. Drink lots of extra fluids today and tomorrow.   4. The incidence of headache, nausea, or vomiting is about 5% (one in 20 patients).  If you develop a headache, lie flat for 24 hours and drink plenty of fluids until the headache goes away.  Caffeinated beverages may be helpful. If when you get up you still have a headache when standing, go back to bed and force fluids for another 24 hours.   5. If you develop severe nausea and vomiting or a headache that does not go away with the flat bedrest after 48 hours, please call [phone].   6. Call your physician for a follow-up appointment.  The results of your myelogram will be sent directly to your physician by the following day.  7. If you have any questions or if complications develop after you arrive home, please call [phone].  Discharge instructions have been explained to the patient.  The patient, or the person responsible for the patient, fully understands these instructions.   Thank you for visiting our office today.

## 2021-01-15 NOTE — Progress Notes (Signed)
Cc'ed to pcp °

## 2021-02-02 ENCOUNTER — Ambulatory Visit: Payer: Medicare Other | Admitting: Gastroenterology

## 2021-02-02 ENCOUNTER — Telehealth: Payer: Self-pay | Admitting: Internal Medicine

## 2021-02-02 NOTE — Telephone Encounter (Signed)
Pt called to let Aliene Altes, PA know that she was down to a week supply of Budesonide 9 mg and said that Cyril Mourning wanted to change the strength. [phone]

## 2021-02-02 NOTE — Telephone Encounter (Signed)
Noted.  Prescription for budesonide 6 mg daily x2 weeks, then 3 mg daily x2 weeks was called into [place].  Patient should be receiving a call when medication is ready for pickup.

## 2021-02-02 NOTE — Telephone Encounter (Signed)
Called pt.  She said that she is currently taking Budesonide 9 mg.  She is calling to let us know that she needs Rx sent for Budesonide 6 mg x 2 wks.  She is tapering down.

## 2021-02-02 NOTE — Telephone Encounter (Signed)
Spoke to pt.  Made her aware that RX has been sent.  She was made aware how to administer it also.  Pt voiced understanding.

## 2021-02-11 ENCOUNTER — Ambulatory Visit (INDEPENDENT_AMBULATORY_CARE_PROVIDER_SITE_OTHER): Payer: Medicare Other | Admitting: Cardiovascular Disease

## 2021-02-11 ENCOUNTER — Encounter: Payer: Self-pay | Admitting: Cardiovascular Disease

## 2021-02-11 ENCOUNTER — Other Ambulatory Visit: Payer: Self-pay

## 2021-02-11 DIAGNOSIS — E782 Mixed hyperlipidemia: Secondary | ICD-10-CM | POA: Diagnosis not present

## 2021-02-11 DIAGNOSIS — I6522 Occlusion and stenosis of left carotid artery: Secondary | ICD-10-CM | POA: Diagnosis not present

## 2021-02-11 DIAGNOSIS — I10 Essential (primary) hypertension: Secondary | ICD-10-CM

## 2021-02-11 DIAGNOSIS — Z720 Tobacco use: Secondary | ICD-10-CM | POA: Diagnosis not present

## 2021-02-11 NOTE — Assessment & Plan Note (Signed)
History of essential hypertension blood pressure measured today at 184/64.  She is on Lotensin, and hydrochlorothiazide.

## 2021-02-11 NOTE — Assessment & Plan Note (Signed)
History of hyperlipidemia on atorvastatin.  She does complain of bilateral leg pain which she thinks may be related to her statin therapy.  Her most recent lipid profile performed 12/23/2020 revealed total cholesterol 155, LDL 69 and HDL 63.  I have told her to go on a statin holiday for 2 months and if her pain resolves we will discuss a PCSK9 otherwise to go back on the statin drug.

## 2021-02-11 NOTE — Assessment & Plan Note (Signed)
History of ongoing tobacco abuse of 10 cigarettes/day recalcitrant risk factor modification.

## 2021-02-11 NOTE — Patient Instructions (Signed)
Medication Instructions:  Your physician recommends that you continue on your current medications as directed. Please refer to the Current Medication list given to you today.  *If you need a refill on your cardiac medications before your next appointment, please call your pharmacy*   Testing/Procedures: Your physician has requested that you have a carotid duplex. This test is an ultrasound of the carotid arteries in your neck. It looks at blood flow through these arteries that supply the brain with blood. Allow one hour for this exam. There are no restrictions or special instructions. This procedure is done at [facility]. 2nd Floor. To be done in August 2022.   Follow-Up: At [facility], you and your health needs are our priority.  As part of our continuing mission to provide you with exceptional heart care, we have created designated Provider Care Teams.  These Care Teams include your primary Cardiologist (physician) and Advanced Practice Providers (APPs -  Physician Assistants and Nurse Practitioners) who all work together to provide you with the care you need, when you need it.  We recommend signing up for the patient portal called "MyChart".  Sign up information is provided on this After Visit Summary.  MyChart is used to connect with patients for Virtual Visits (Telemedicine).  Patients are able to view lab/test results, encounter notes, upcoming appointments, etc.  Non-urgent messages can be sent to your provider as well.   To learn more about what you can do with MyChart, go to [email or web address].    Your next appointment:   12 month(s)  The format for your next appointment:   In Person  Provider:   Quay Burow, MD   Other Instructions Statin holiday for 2 months. Then give [place] a call to let Dr. Gwenlyn Found know if there was in improvement in the legs.

## 2021-02-11 NOTE — Assessment & Plan Note (Signed)
History of moderate left ICA stenosis by duplex ultrasound performed 04/30/2020.  This will be repeated this coming August.

## 2021-02-11 NOTE — Progress Notes (Signed)
02/11/2021 [name]   [birth year]  [id number]  Primary Physician Lemmie Evens, MD Primary Cardiologist: Lorretta Harp MD FACP, [place], [place], [place]  HPI:  [name] is a 80 y.o.  mildly-overweight married Caucasian female, mother of 2 and grandmother of 3 grandchildren, whom I last saw in the office  01/29/2020.  She is accompanied by her husband today.. She has a history of moderate left internal carotid artery stenosis which we have been following by duplex ultrasound on an annual basis. She continues to smoke 10-15 cigarettes a day despite counseling to the contrary. Her other problems include hypertension and hyperlipidemia. She denies chest pain or shortness of breath. She had a Myoview performed November 27, 2008, which was nonischemic. Dr. Karie Kirks follows her lipid profile.  Since I saw her a year ago she is remained stable.  She denies chest pain or shortness of breath.  Unfortunately she still smokes 5 to 6 cigarettes a day.  Her last carotid Doppler study performed in August of last year did show moderate left ICA stenosis.   Current Meds  Medication Sig  . acetaminophen (TYLENOL) 500 MG tablet Take 1,000 mg by mouth every 6 (six) hours as needed.  Marland Kitchen acyclovir (ZOVIRAX) 200 MG capsule Take 200 mg by mouth 5 (five) times daily as needed (fever blisters).   Marland Kitchen albuterol (PROVENTIL HFA;VENTOLIN HFA) 108 (90 BASE) MCG/ACT inhaler Inhale 2 puffs into the lungs every 6 (six) hours as needed for wheezing or shortness of breath.  . ALPRAZolam (XANAX) 0.5 MG tablet Take 0.25 mg by mouth daily as needed for anxiety or sleep.   . Ascorbic Acid (VITAMIN C) 1000 MG tablet Take 1,000 mg by mouth daily.  Marland Kitchen aspirin EC 81 MG tablet Take 81 mg by mouth 2 (two) times a week. Wednesday and sundays  . atorvastatin (LIPITOR) 40 MG tablet Take 40 mg by mouth daily at 6 PM.   . benazepril (LOTENSIN) 10 MG tablet Take 10 mg by mouth at bedtime.   . budesonide (ENTOCORT EC) 3 MG 24 hr capsule  Take 3 capsules (9 mg total) by mouth daily. (Patient taking differently: Take 3 mg by mouth daily.)  . cholecalciferol (VITAMIN D3) 25 MCG (1000 UNIT) tablet Take 1,000 Units by mouth daily.  . hydrochlorothiazide (MICROZIDE) 12.5 MG capsule Take 12.5 mg by mouth daily.   Marland Kitchen HYDROcodone-acetaminophen (NORCO/VICODIN) 5-325 MG tablet One tablet every four hours as needed for acute pain.  Limit of five days per [place] statue. (Patient taking differently: One tablet every four hours as needed for acute pain.  Limit of five days per [place] statue.)  . levothyroxine (SYNTHROID, LEVOTHROID) 88 MCG tablet Take 88 mcg by mouth daily before breakfast.  . loperamide (IMODIUM) 2 MG capsule Take 2-4 mg by mouth as needed for diarrhea or loose stools.  Marland Kitchen MAGNESIUM PO Take 1 tablet by mouth daily.  Marland Kitchen MELATONIN PO Take 3 mg by mouth at bedtime.  . Multiple Vitamins-Minerals (PRESERVISION AREDS 2 PO) Take 1 tablet by mouth daily.  Marland Kitchen omeprazole (PRILOSEC) 20 MG capsule Take 20 mg by mouth 2 (two) times a week. Wednesday and Sundays  . vitamin B-12 (CYANOCOBALAMIN) 1000 MCG tablet Take 1,000 mcg by mouth every other day.     Allergies  Allergen Reactions  . Codeine Nausea And Vomiting    Social History   Socioeconomic History  . Marital status: Married    Spouse name: Not on file  . Number  of children: Not on file  . Years of education: Not on file  . Highest education level: Not on file  Occupational History  . Not on file  Tobacco Use  . Smoking status: Current Every Day Smoker    Packs/day: 0.25    Years: 55.00    Pack years: 13.75    Types: Cigarettes  . Smokeless tobacco: Never Used  . Tobacco comment: 5-6 cigs/day  Vaping Use  . Vaping Use: Never used  Substance and Sexual Activity  . Alcohol use: No  . Drug use: No  . Sexual activity: Yes    Birth control/protection: Surgical  Other Topics Concern  . Not on file  Social History Narrative  . Not on file   Social Determinants of  Health   Financial Resource Strain: Not on file  Food Insecurity: Not on file  Transportation Needs: Not on file  Physical Activity: Not on file  Stress: Not on file  Social Connections: Not on file  Intimate Partner Violence: Not on file     Review of Systems: General: negative for chills, fever, night sweats or weight changes.  Cardiovascular: negative for chest pain, dyspnea on exertion, edema, orthopnea, palpitations, paroxysmal nocturnal dyspnea or shortness of breath Dermatological: negative for rash Respiratory: negative for cough or wheezing Urologic: negative for hematuria Abdominal: negative for nausea, vomiting, diarrhea, bright red blood per rectum, melena, or hematemesis Neurologic: negative for visual changes, syncope, or dizziness All other systems reviewed and are otherwise negative except as noted above.    Blood pressure (!) 184/64, pulse 63, height 5\' 5"  (1.651 m), weight 141 lb 12.8 oz (64.3 kg), SpO2 98 %.  General appearance: alert and no distress Neck: no adenopathy, no carotid bruit, no JVD, supple, symmetrical, trachea midline and thyroid not enlarged, symmetric, no tenderness/mass/nodules Lungs: clear to auscultation bilaterally Heart: regular rate and rhythm, S1, S2 normal, no murmur, click, rub or gallop Extremities: extremities normal, atraumatic, no cyanosis or edema Pulses: 2+ and symmetric Skin: Skin color, texture, turgor normal. No rashes or lesions Neurologic: Alert and oriented X 3, normal strength and tone. Normal symmetric reflexes. Normal coordination and gait  EKG sinus rhythm at 63 without ST or T wave changes.  I personally reviewed this EKG.  ASSESSMENT AND PLAN:   Essential hypertension History of essential hypertension blood pressure measured today at 184/64.  She is on Lotensin, and hydrochlorothiazide.  Hyperlipidemia History of hyperlipidemia on atorvastatin.  She does complain of bilateral leg pain which she thinks may be  related to her statin therapy.  Her most recent lipid profile performed 12/23/2020 revealed total cholesterol 155, LDL 69 and HDL 63.  I have told her to go on a statin holiday for 2 months and if her pain resolves we will discuss a PCSK9 otherwise to go back on the statin drug.  Carotid artery disease History of moderate left ICA stenosis by duplex ultrasound performed 04/30/2020.  This will be repeated this coming August.  Tobacco abuse History of ongoing tobacco abuse of 10 cigarettes/day recalcitrant risk factor modification.      Lorretta Harp MD FACP,FACC,FAHA, [facility] 02/11/2021 2:24 PM

## 2021-03-05 ENCOUNTER — Encounter: Payer: Self-pay | Admitting: Internal Medicine

## 2021-04-20 ENCOUNTER — Ambulatory Visit: Payer: Medicare Other | Admitting: Gastroenterology

## 2021-04-29 ENCOUNTER — Encounter: Payer: Self-pay | Admitting: *Deleted

## 2021-05-05 ENCOUNTER — Ambulatory Visit (HOSPITAL_COMMUNITY)
Admission: RE | Admit: 2021-05-05 | Discharge: 2021-05-05 | Disposition: A | Discharge status: Home / Self Care | Payer: Medicare Other | Source: Ambulatory Visit | Attending: Cardiology | Admitting: Cardiology

## 2021-05-05 ENCOUNTER — Other Ambulatory Visit (HOSPITAL_COMMUNITY): Payer: Self-pay | Admitting: Cardiovascular Disease

## 2021-05-05 ENCOUNTER — Other Ambulatory Visit: Payer: Self-pay

## 2021-05-05 DIAGNOSIS — I6522 Occlusion and stenosis of left carotid artery: Secondary | ICD-10-CM | POA: Insufficient documentation

## 2021-05-05 DIAGNOSIS — I6523 Occlusion and stenosis of bilateral carotid arteries: Secondary | ICD-10-CM

## 2021-05-26 ENCOUNTER — Other Ambulatory Visit: Payer: Self-pay | Admitting: Gastroenterology

## 2021-05-26 ENCOUNTER — Telehealth: Payer: Self-pay | Admitting: Internal Medicine

## 2021-05-26 DIAGNOSIS — R197 Diarrhea, unspecified: Secondary | ICD-10-CM

## 2021-05-26 DIAGNOSIS — K52831 Collagenous colitis: Secondary | ICD-10-CM

## 2021-05-26 NOTE — Telephone Encounter (Signed)
She has history of collagenous colitis.  Suspect this is likely etiology of her diarrhea flare.  Is she taking budesonide? I believe she tapered off of this in May.  Has she been on any antibiotics in the last 6 months, hospitalized in the last 6 months, recent sick contacts? How many BMs per day? Watery? Nocturnal BMs?

## 2021-05-26 NOTE — Telephone Encounter (Signed)
Labs were ordered, lmom for pt to return my call.

## 2021-05-26 NOTE — Telephone Encounter (Signed)
Lets get stool studies including C. difficile GDH, toxin A/B  and GI pathogen panel to ensure no infectious diarrhea.  Please arrange.  If negative, will resume budesonide.

## 2021-05-26 NOTE — Telephone Encounter (Signed)
Pt called stating that she has had diarrhea for going on 2 weeks now and imodium is not working. Pt states that there is blood on the toilet tissue when she wipes. Pt does not have any nausea, vomiting or fever. Please advise.

## 2021-05-26 NOTE — Telephone Encounter (Signed)
Pt is having problems with her diarrhea. She said she was taking Immodium and it wasn't helping. Please advise. [phone]

## 2021-05-26 NOTE — Telephone Encounter (Signed)
Pt states that she is not taking the budesonide. She was on an antibiotic a little over a week ago for a day that her dentist prescribed her. Pt has not been in the hospital or around anyone that she knows of that was sick. Pt is having 5 to 7 BMs a day and it is watery. Pt has had some nocturnal BMs.

## 2021-05-26 NOTE — Telephone Encounter (Signed)
Pt was made aware and verbalized understanding.  

## 2021-05-29 LAB — C. DIFFICILE GDH AND TOXIN A/B
GDH ANTIGEN: NOT DETECTED
MICRO NUMBER:: 12342835
SPECIMEN QUALITY:: ADEQUATE
TOXIN A AND B: NOT DETECTED

## 2021-05-29 LAB — GASTROINTESTINAL PATHOGEN PANEL PCR
C. difficile Tox A/B, PCR: NOT DETECTED
Campylobacter, PCR: NOT DETECTED
Cryptosporidium, PCR: NOT DETECTED
E coli (ETEC) LT/ST PCR: NOT DETECTED
E coli (STEC) stx1/stx2, PCR: NOT DETECTED
E coli 0157, PCR: NOT DETECTED
Giardia lamblia, PCR: NOT DETECTED
Norovirus, PCR: NOT DETECTED
Rotavirus A, PCR: NOT DETECTED
Salmonella, PCR: NOT DETECTED
Shigella, PCR: NOT DETECTED

## 2021-06-22 ENCOUNTER — Other Ambulatory Visit (HOSPITAL_COMMUNITY): Payer: Self-pay | Admitting: Family Medicine

## 2021-06-22 DIAGNOSIS — Z1231 Encounter for screening mammogram for malignant neoplasm of breast: Secondary | ICD-10-CM

## 2021-07-06 ENCOUNTER — Other Ambulatory Visit: Payer: Self-pay

## 2021-07-06 ENCOUNTER — Ambulatory Visit (HOSPITAL_COMMUNITY)
Admission: RE | Admit: 2021-07-06 | Discharge: 2021-07-06 | Disposition: A | Discharge status: Home / Self Care | Payer: Medicare Other | Source: Ambulatory Visit | Attending: Family Medicine | Admitting: Family Medicine

## 2021-07-06 DIAGNOSIS — Z1231 Encounter for screening mammogram for malignant neoplasm of breast: Secondary | ICD-10-CM | POA: Diagnosis present

## 2021-07-17 ENCOUNTER — Ambulatory Visit: Payer: Medicare Other | Admitting: Gastroenterology

## 2021-07-19 NOTE — Progress Notes (Signed)
Referring Provider: Lemmie Evens, MD Primary Care Physician:  Lemmie Evens, MD Primary GI Physician: Dr. Gala Romney  Chief Complaint  Patient presents with   collagenous colitis    Ok now     HPI:   [name] is a 80 y.o. female presenting today for follow-up of collagenous colitis. History of acute colitis on CT in March 2021 with colonic wall thickening greatest at the sigmoid colon s/p treatment with Cipro and Flagyl.  Despite antibiotics, she had persistent diarrhea with negatives C. difficile and GI pathogen panel.  Celiac serologies negative.  TSH within normal limits.  She was tried on several medications including Colestid, Imodium, and Bentyl and ultimately underwent colonoscopy in May 2021 revealing collagenous colitis.   We have tapered off budesonide a couple of times, but after a few months, she ends up with return of diarrhea.  Tapered off in late November 2021, return of diarrhea in March 2022.  Resume budesonide 9 mg daily.  Tapered off budesonide in June 2022 and had return of diarrhea in September.   Last seen in our office 01/14/2021.  Diarrhea resolved.  No other significant GI symptoms.  With flare of diarrhea in March, she denied medication changes, dietary changes, NSAIDs aside from 81 mg aspirin twice a week.  She did continue smoking, but had decreased to about 10 cigarettes daily.  Also on Lipitor daily and omeprazole twice weekly.  I did notice she had lost about 5 pounds over the last 6 months unintentionally.  Plan to complete 8-week course of budesonide and then taper to 6 mg x 2 weeks, then 3 mg x 2 weeks.  Also recommended discussing atorvastatin with cardiology to see if there are other options to manage her cholesterol, continue working towards smoking cessation.  Patient return call to the office after her appointment and let me know that she has been having a lot of problems with her teeth and this is why she was not eating as much.  Recommended sticking  to soft foods, but ensure she is eating at least 4-6 meals a day and drinking a couple protein shakes per day.  Patient tapered off budesonide in mid June. Called in September reporting return of diarrhea with 5-7 watery bowel movements per day, some nocturnal stools.  C. difficile and GI pathogen panel were negative.  Plan to resume budesonide 9 mg daily.  Today: Taking budesonide 9 mg capsule daily. Has 14 pills left. Diarrhea resolved.  Usually with 2 soft BMs per day. Very rare use of imodium. No blood in the stools or black stools. No abdominal pain. No nausea or vomiting.   Down 4 pounds in 6 months.  Total of 9 pound weight loss in 7 months. States she feels her weight has actually been stable recently.  She does weigh herself at home.  She has been having a lot of trouble with her teeth which has limited her oral intake.  She did have a tooth pulled in the latter part of August, waiting on a implant.  Also with very loose teeth on the upper right side and dentist is starting to address these.  Overall, since she had a tooth pulled in August, she does feel she has been eating somewhat better.  Eats crackers and banana for breakfast, fruit, often eats a sandwich for lunch and pinto's, potato salad, and turning greens for dinner.   When diarrhea started back in September, she was completing a course of antibiotics for her recent  tooth extraction.  No other medication changes. Down to 6-7 cigarettes daily. Went on a statin holiday in May-July per cardiology for leg pain, but didn't see a difference, so resumed medication.   No NSAID's aside from aspirin twice a week. Takes omeprazole twice a week with aspirin. Very rare reflux symptoms. Has history of gastric ulcer in remote past.     Counseled on importance of smoking cessation   Past Medical History:  Diagnosis Date   Arthritis    Carotid artery disease ([place])    Collagenous colitis 01/2020   COPD (chronic obstructive pulmonary disease)  (HCC)    Dysrhythmia    palpitations   GERD (gastroesophageal reflux disease)    Hypercholesteremia    Hypertension    Hypothyroidism    Shortness of breath     Past Surgical History:  Procedure Laterality Date   ABDOMINAL HYSTERECTOMY     APPENDECTOMY     BACK SURGERY  2019   BIOPSY  01/23/2020   Procedure: BIOPSY;  Surgeon: Daneil Dolin, MD;  Location: [facility];  Service: Endoscopy;;  ascending, desending colon, sigmoid   BREAST BIOPSY Left 05/04/2017   Procedure: BREAST BIOPSY WITH NEEDLE LOCALIZATION;  Surgeon: Aviva Signs, MD;  Location: [facility];  Service: General;  Laterality: Left;  needle loc at 8:00   CATARACT EXTRACTION W/PHACO Left 09/30/2014   Procedure: CATARACT EXTRACTION PHACO AND INTRAOCULAR LENS PLACEMENT LEFT EYE;  Surgeon: Tonny Branch, MD;  Location: [facility];  Service: Ophthalmology;  Laterality: Left;  CDE:8.79   CATARACT EXTRACTION W/PHACO Right 10/17/2014   Procedure: CATARACT EXTRACTION PHACO AND INTRAOCULAR LENS PLACEMENT RIGHT EYE;  Surgeon: Tonny Branch, MD;  Location: [facility];  Service: Ophthalmology;  Laterality: Right;  CDE: 7.80   CHOLECYSTECTOMY N/A 04/30/2013   Procedure: Attempted LAPAROSCOPIC CHOLECYSTECTOMY ; converted to open procedure @ 1510;  Surgeon: Scherry Ran, MD;  Location: [facility];  Service: General;  Laterality: N/A;   CHOLECYSTECTOMY N/A 04/30/2013   Procedure: CHOLECYSTECTOMY;  Surgeon: Scherry Ran, MD;  Location: [facility];  Service: General;  Laterality: N/A;  procedure started @ 1520   COLONOSCOPY  06/11/2004   External hemorrhoids, few scattered left-sided diverticula, otherwise normal.  Repeat colonoscopy in 10 years.   COLONOSCOPY N/A 01/23/2020   Procedure: COLONOSCOPY;  Surgeon: Daneil Dolin, MD;  Multiple small larg[place]outh diverticula in the sigmoid and descending colon, otherwise normal exam s/p segmental biopsies of right and left colon for histologic study.  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    ESOPHAGOGASTRODUODENOSCOPY  06/11/2004   Distal esophageal erosions consistent with mild erosive reflux esophagitis, heaped up adenomatous appearing prepyloric antral mucosa biopsy, single antral prepyloric ulcer biopsied, normal D1 and D2.   ESOPHAGOGASTRODUODENOSCOPY  10/07/2004   Distal esophageal erosions consistent with erosive reflux esophagitis, otherwise normal esophagus, normal stomach, previously noted ulcer healed completely, normal D1 and D2.   LAMINECTOMY  12/26/2017   spine surgery Dr Carloyn Manner   TUBAL LIGATION      Current Outpatient Medications  Medication Sig Dispense Refill   acetaminophen (TYLENOL) 500 MG tablet Take 1,000 mg by mouth every 6 (six) hours as needed.     acyclovir (ZOVIRAX) 200 MG capsule Take 200 mg by mouth 5 (five) times daily as needed (fever blisters).      albuterol (PROVENTIL HFA;VENTOLIN HFA) 108 (90 BASE) MCG/ACT inhaler Inhale 2 puffs into the lungs every 6 (six) hours as needed for wheezing or shortness of breath.     ALPRAZolam (  XANAX) 0.5 MG tablet Take 0.25 mg by mouth daily as needed for anxiety or sleep.      Ascorbic Acid (VITAMIN C) 1000 MG tablet Take 1,000 mg by mouth daily.     aspirin EC 81 MG tablet Take 81 mg by mouth 2 (two) times a week. Wednesday and sundays     atorvastatin (LIPITOR) 40 MG tablet Take 40 mg by mouth daily at 6 PM.      benazepril (LOTENSIN) 10 MG tablet Take 10 mg by mouth at bedtime.      budesonide (ENTOCORT EC) 3 MG 24 hr capsule Take 3 capsules (9 mg total) by mouth daily. 90 capsule 1   cholecalciferol (VITAMIN D3) 25 MCG (1000 UNIT) tablet Take 1,000 Units by mouth daily.     hydrochlorothiazide (MICROZIDE) 12.5 MG capsule Take 12.5 mg by mouth daily.      HYDROcodone-acetaminophen (NORCO/VICODIN) 5-325 MG tablet One tablet every four hours as needed for acute pain.  Limit of five days per [place] statue. (Patient taking differently: One tablet every four hours as needed for acute pain.  Limit of five days per [place]  state statue.) 30 tablet 0   levothyroxine (SYNTHROID, LEVOTHROID) 88 MCG tablet Take 88 mcg by mouth daily before breakfast.     loperamide (IMODIUM) 2 MG capsule Take 2-4 mg by mouth as needed for diarrhea or loose stools.     MAGNESIUM PO Take 1 tablet by mouth daily.     MELATONIN PO Take 3 mg by mouth at bedtime.     Multiple Vitamins-Minerals (PRESERVISION AREDS 2 PO) Take 1 tablet by mouth 2 (two) times daily.     omeprazole (PRILOSEC) 20 MG capsule Take 20 mg by mouth 2 (two) times a week. Wednesday and Sundays     vitamin B-12 (CYANOCOBALAMIN) 1000 MCG tablet Take 1,000 mcg by mouth every other day.     No current facility-administered medications for this visit.   Facility-Administered Medications Ordered in Other Visits  Medication Dose Route Frequency Provider Last Rate Last Admin   fentaNYL (SUBLIMAZE) injection 25-50 mcg  25-50 mcg Intravenous Q5 min PRN Lerry Liner, MD        Allergies as of 07/20/2021 - Review Complete 07/20/2021  Allergen Reaction Noted   Codeine Nausea And Vomiting 04/26/2013    Family History  Problem Relation Age of Onset   Heart failure Mother    Cancer Father    Colon cancer Neg Hx    Inflammatory bowel disease Neg Hx     Social History   Socioeconomic History   Marital status: Married    Spouse name: Not on file   Number of children: Not on file   Years of education: Not on file   Highest education level: Not on file  Occupational History   Not on file  Tobacco Use   Smoking status: Every Day    Packs/day: 0.25    Years: 55.00    Pack years: 13.75    Types: Cigarettes   Smokeless tobacco: Never   Tobacco comments:    5-6 cigs/day  Vaping Use   Vaping Use: Never used  Substance and Sexual Activity   Alcohol use: No   Drug use: No   Sexual activity: Yes    Birth control/protection: Surgical  Other Topics Concern   Not on file  Social History Narrative   Not on file   Social Determinants of Health   Financial  Resource Strain: Not on file  Food  Insecurity: Not on file  Transportation Needs: Not on file  Physical Activity: Not on file  Stress: Not on file  Social Connections: Not on file    Review of Systems: Gen: Denies fever, chills, cold or flulike symptoms, presyncope, syncope. GI: See HPI Heme: See HPI  Physical Exam: BP (!) 155/60   Pulse 74   Temp (!) 97.1 F (36.2 C) (Temporal)   Ht 5\' 5"  (1.651 m)   Wt 139 lb (63 kg)   BMI 23.13 kg/m  General:   Alert and oriented. No distress noted. Pleasant and cooperative.  Head:  Normocephalic and atraumatic. Eyes:  Conjuctiva clear without scleral icterus. Heart:  S1, S2 present without murmurs appreciated. Lungs:  Clear to auscultation bilaterally. No wheezes, rales, or rhonchi. No distress.  Abdomen:  +BS, soft, non-tender and non-distended. No rebound or guarding. No HSM or masses noted. Msk:  Symmetrical without gross deformities. Normal posture. Extremities:  Without edema. Neurologic:  Alert and  oriented x4 Psych: Normal mood and affect.    Assessment: 80 year old female with history of collagenous colitis diagnosed May 2021.  Previously celiac serologies negative and TSH within normal limits, and failing Colestid, Imodium, and Bentyl.  She has had good response to budesonide, but unfortunately after tapering off, she seems to have recurrent diarrhea after 2-3 months.  Most recently tapered off budesonide in June 2022 and had return of diarrhea in September.  C. difficile and GI pathogen panel were negative.  Budesonide 9 mg daily was resumed and diarrhea has now resolved. Denies brbpr or melena. She has had some weight loss discussed below.   Etiology of collagenous colitis flare not entirely clear. She was on antibiotics with most recent flare.  She is also on a few medications that can contribute including aspirin twice weekly, omeprazole twice weekly, and Lipitor.  Also continues smoking, but is working on decreasing this.  Previously advised to discuss stopping Lipitor with Cardiology. She did complete a statin holiday May-July, but this was for leg pain. Advised to talk with cardiology again. Aspirin likely will need to be continued due to carotid artery stenosis and would continue omeprazole with aspirin due to history of PUD. At this time, we will complete an 8-week course of budesonide and attempt to taper to the lowest possible dose to prevent symptom recurrence. Again encouraged her to continue working towards smoking cessation.   Weight loss:  I do note patient has had a 9 pound weight loss in the last 7 months.  Initially lost 5 pounds between March and April, and 4 pounds since April.  Likely multifactorial in the setting of recurrent diarrhea and decreased oral intake in the setting of significant dental issues.  No other significant GI symptoms.  Per patient, weight has been stable recently and she feels she is eating better as her dental issues are being addressed.  Still with some pending dental work.  We will continue to monitor for now.   Plan: Complete 8-week course of budesonide.  Patient has 2 weeks left.  She will call as she gets close to the end of this prescription for refill.  We will plan for a slow taper-budesonide 6 mg x 4 weeks, then attempt to reduce to 3 mg as long as she is doing well. Continue to avoid NSAIDs aside from 81 mg aspirin twice weekly. Continue omeprazole 20 mg twice weekly with aspirin. To help maintain weight, recommended 3 meals per day, 1-2 snacks daily, increase dietary protein intake, and 1-2  protein shakes daily. Follow-up in 3-4 months.   Aliene Altes, PA-C [facility] 07/20/2021

## 2021-07-20 ENCOUNTER — Encounter: Payer: Self-pay | Admitting: Gastroenterology

## 2021-07-20 ENCOUNTER — Other Ambulatory Visit: Payer: Self-pay

## 2021-07-20 ENCOUNTER — Ambulatory Visit (INDEPENDENT_AMBULATORY_CARE_PROVIDER_SITE_OTHER): Payer: Medicare Other | Admitting: Gastroenterology

## 2021-07-20 VITALS — BP 155/60 | HR 74 | Temp 97.1°F | Ht 65.0 in | Wt 139.0 lb

## 2021-07-20 DIAGNOSIS — R634 Abnormal weight loss: Secondary | ICD-10-CM

## 2021-07-20 DIAGNOSIS — K52831 Collagenous colitis: Secondary | ICD-10-CM | POA: Diagnosis not present

## 2021-07-20 NOTE — Patient Instructions (Signed)
Continue budesonide 9 mg daily.  Call me when you are down to about 4 days of medications and I will send in a new prescription for 6 mg daily.  Continue to avoid all NSAIDs aside from 81 mg aspirin twice weekly.  Continue omeprazole 20 mg twice weekly with aspirin.   To help maintain your weight: Try eating 3 main daily and 1-2 snacks daily.  Try increasing your dietary protein intake.  Continue drinking 1-2 protein shakes daily.  We will plan to see you back in 3-4 months.  Do not hesitate to call if you have questions or concerns prior to your next visit.   It was a pleasure to see you again today!  Aliene Altes, PA-C [facility]

## 2021-07-29 ENCOUNTER — Telehealth: Payer: Self-pay | Admitting: *Deleted

## 2021-07-29 NOTE — Telephone Encounter (Signed)
Communication noted.  

## 2021-07-29 NOTE — Telephone Encounter (Signed)
Spoke to pt. Informed her of recommendations. Pt.voiced understanding.

## 2021-07-29 NOTE — Telephone Encounter (Signed)
Spoke to pt. She informed me that she only has 4 more Budesonide 9 mg left. She stated she was told to call the office when she was almost out.

## 2021-07-29 NOTE — Telephone Encounter (Signed)
Please let her know I have called in a new prescription for budesonide 6 mg to [facility].  After she completes her current 9 mg prescription, start budesonide 6 mg daily. We will plan to treat for 4 weeks with this dose.  When she is down to about 1 week of the 6 mg capsules, please call us with a progress report.  We will make a decision at that time whether we will decrease to 3 mg.

## 2021-08-28 ENCOUNTER — Telehealth: Payer: Self-pay

## 2021-08-28 NOTE — Telephone Encounter (Signed)
Pt was made aware and verbalized understanding.  

## 2021-08-28 NOTE — Telephone Encounter (Signed)
Pt called stating that she has 4 of the 6 mg budesonide left. Pt stated that she was instructed to call to get a lower dose sent in. Pt is doing well.

## 2021-08-28 NOTE — Telephone Encounter (Signed)
Please let patient know I have called in a new prescription to [facility] for budesonide 3 mg daily.  We will plan to continue this for at least 4 weeks.  I would like for her to call with a progress report about 1 week prior to her prescription running out.  We will make a decision at that time if we will continue the medication or discontinue.  If she has any worsening diarrhea with reducing her dose to 3 mg, she should let us know.

## 2021-09-23 ENCOUNTER — Telehealth: Payer: Self-pay

## 2021-09-23 NOTE — Telephone Encounter (Signed)
Paperwork filled out. Returned to [employer].

## 2021-09-23 NOTE — Telephone Encounter (Signed)
Documentation from [employer] put on your desk. They need codes because the one's given were denied.

## 2021-09-23 NOTE — Telephone Encounter (Signed)
Paperwork faxed and confirmation page returned with successful transmission.

## 2021-11-11 ENCOUNTER — Telehealth: Payer: Self-pay | Admitting: Internal Medicine

## 2021-11-11 NOTE — Telephone Encounter (Signed)
Please find out how long she has been out of budesonide. In December we decreased her dose to 3 mg daily with plans to continue this at least 4 weeks. She was to call back at that time to let us know how she was doing, but we never received a progress report.

## 2021-11-11 NOTE — Telephone Encounter (Signed)
Spoke to pt, she informed that she is having bad diarrhea again. She has had diarrhea for a couple days and is going 3 to 4 times a day. States she has been taking Imodium and it is not helping. Would like Budesonide 9mg  sent to [facility].

## 2021-11-11 NOTE — Telephone Encounter (Signed)
Patient has question about her medicine and causing her to have diarrhea. She is asking for something to be called into [place] for diarrhea. [phone]

## 2021-11-12 NOTE — Telephone Encounter (Signed)
Pts husband states that the patient has been out for 3 to 4 weeks now and that the decreased dose did work. Pt is requesting refills to be sent to [facility].

## 2021-11-13 NOTE — Telephone Encounter (Signed)
We will resume budesonide 3 mg daily as this was controlling her symptoms well.  Hopefully she will respond well to this.  Patient was notified of recommendations.  I have called in a refill to [facility].  Requested patient to call in a week if no improvement in symptoms.

## 2021-12-07 NOTE — Progress Notes (Signed)
? ? ?Referring Provider: Lemmie Evens, MD ?Primary Care Physician:  Lemmie Evens, MD ?Primary GI Physician: Dr. Gala Romney ? ?No chief complaint on file. ? ? ?HPI:   ?[name] is a 81 y.o. female presenting today for follow-up of collagenous colitis.  Collagenous colitis was diagnosed in May 2021.  Prior stool studies including C. difficile and GI pathogen panel as well as celiac serologies and TSH were within normal limits.  She has been treated with budesonide and we have tapered off budesonide a few times, but after a few months, she has return of diarrhea. ? ?Last seen in our office 07/20/2021.  She had previously called in September reporting return of diarrhea.  She was resumed on budesonide 9 mg daily and was on that dose at the time of her office visit with about 4 days left.  Diarrhea had resolved and was having 2 soft bowel movements daily.  Rare use of Imodium.  No BRBPR, melena, abdominal pain.  She was working on decreasing cigarette use.  She was down 4 pounds in 6 months, total of 9 pound weight loss in 7 months.  Per patient, she reported her weight had been stable recently as she weighs herself at home.  She had previously been having a lot of trouble with her teeth which had limited her oral intake.  She had a tooth pulled in the latter part of August and was waiting on implant.  Also with very loose teeth on the upper right side and dentist will starting to address this.  Recommended completing 8-week course of budesonide 9 mg, then plan to decrease to the lowest effective dose.  Advised to continue avoiding NSAIDs aside from 81 mg aspirin twice weekly, continue omeprazole 20 mg twice weekly with aspirin, discussed increasing dietary intake and adding protein shakes.  Plan to follow-up in 3-4 months. ? ?Patient code 07/29/2021 reporting she was about out of budesonide 9 mg.  Recommended decreasing to 6 mg x 4 weeks and call with a progress report.  She called on 12/9 reporting she was almost  out of 6 mg budesonide and was doing well.  Dose was decreased to 3 mg daily requested progress report in 4 weeks. ? ?Patient did not call the progress report, but called 11/11/2021 reporting return of to watery diarrhea.  She had been out of budesonide for 3 to 4 weeks.  Recommended resuming budesonide 3 mg. ? ?Today: ?Eating well, and drinking smoothies daily to help with daily intake. Drinking 1 protein shake daily. Gained 9 lbs since last visit. Denies nausea, vomiting, reflux symptoms, or dysphagia.  ? ?Diarrhea resolved. Bowels moving daily. No brbpr or melena.  Taking 3 mg daily.  ? ? ?Past Medical History:  ?Diagnosis Date  ? Arthritis   ? Carotid artery disease ([place])   ? Collagenous colitis 01/2020  ? COPD (chronic obstructive pulmonary disease) ([place])   ? Dysrhythmia   ? palpitations  ? GERD (gastroesophageal reflux disease)   ? Hypercholesteremia   ? Hypertension   ? Hypothyroidism   ? Shortness of breath   ? ? ?Past Surgical History:  ?Procedure Laterality Date  ? ABDOMINAL HYSTERECTOMY    ? APPENDECTOMY    ? BACK SURGERY  2019  ? BIOPSY  01/23/2020  ? Procedure: BIOPSY;  Surgeon: Daneil Dolin, MD;  Location[facility];  Service: Endoscopy;;  ascending, desending colon, sigmoid  ? BREAST BIOPSY Left 05/04/2017  ? Procedure: BREAST BIOPSY WITH NEEDLE LOCALIZATION;  Surgeon: Arnoldo Morale,  Elta Guadeloupe, MD;  Location: [facility] ORS;  Service: General;  Laterality: Left;  needle loc at 8:00  ? CATARACT EXTRACTION W/PHACO Left 09/30/2014  ? Procedure: CATARACT EXTRACTION PHACO AND INTRAOCULAR LENS PLACEMENT LEFT EYE;  Surgeon: Tonny Branch, MD;  Location: [facility] OR[facility];  Service: Ophthalmology;  Laterality: Left;  CDE:8.79  ? CATARACT EXTRACTION W/PHACO Right 10/17/2014  ? Procedure: CATARACT EXTRACTION PHACO AND INTRAOCULAR LENS PLACEMENT RIGHT EYE;  Surgeon: Tonny Branch, MD;  Location: [facility] ORS;  Service: Ophthalmology;  Laterality: Right;  CDE: 7.80  ? CHOLECYSTECTOMY N/A 04/30/2013  ? Procedure: Attempted LAPAROSCOPIC CHOLECYSTECTOMY ;  converted to open procedure @ 1510;  Surgeon: Scherry Ran, MD;  Location: [facility];  Service: General;  Laterality: N/A;  ? CHOLECYSTECTOMY N/A 04/30/2013  ? Procedure: CHOLECYSTECTOMY;  Surgeon: Scherry Ran, MD;  Location: [facility] ORS;  Service: General;  Laterality: N/A;  procedure started @ 1520  ? COLONOSCOPY  06/11/2004  ? External hemorrhoids, few scattered left-sided diverticula, otherwise normal.  Repeat colonoscopy in 10 years.  ? COLONOSCOPY N/A 01/23/2020  ? Procedure: COLONOSCOPY;  Surgeon: Daneil Dolin, MD;  Multiple small largemouth diverticula in the sigmoid and descending colon, otherwise normal exam s/p segmental biopsies of right and left colon for histologic study.  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  ? ESOPHAGOGASTRODUODENOSCOPY  06/11/2004  ? Distal esophageal erosions consistent with mild erosive reflux esophagitis, heaped up adenomatous appearing prepyloric antral mucosa biopsy, single antral prepyloric ulcer biopsied, normal D1 and D2.  ? ESOPHAGOGASTRODUODENOSCOPY  10/07/2004  ? Distal esophageal erosions consistent with erosive reflux esophagitis, otherwise normal esophagus, normal stomach, previously noted ulcer healed completely, normal D1 and D2.  ? LAMINECTOMY  12/26/2017  ? spine surgery Dr Carloyn Manner  ? TUBAL LIGATION    ? ? ?Current Outpatient Medications  ?Medication Sig Dispense Refill  ? acetaminophen (TYLENOL) 500 MG tablet Take 1,000 mg by mouth every 6 (six) hours as needed.    ? albuterol (PROVENTIL HFA;VENTOLIN HFA) 108 (90 BASE) MCG/ACT inhaler Inhale 2 puffs into the lungs every 6 (six) hours as needed for wheezing or shortness of breath.    ? ALPRAZolam (XANAX) 0.5 MG tablet Take 0.25 mg by mouth daily as needed for anxiety or sleep.     ? Ascorbic Acid (VITAMIN C) 1000 MG tablet Take 1,000 mg by mouth daily.    ? aspirin EC 81 MG tablet Take 81 mg by mouth 2 (two) times a week. Wednesday and sundays    ? atorvastatin (LIPITOR) 40 MG tablet Take  40 mg by mouth daily at 6 PM.     ? benazepril (LOTENSIN) 10 MG tablet Take 10 mg by mouth at bedtime.     ? budesonide (ENTOCORT EC) 3 MG 24 hr capsule Take 3 capsules (9 mg total) by mouth daily. (Patient taking differently: Take 3 mg by mouth daily.) 90 capsule 1  ? cholecalciferol (VITAMIN D3) 25 MCG (1000 UNIT) tablet Take 1,000 Units by mouth daily.    ? hydrochlorothiazide (MICROZIDE) 12.5 MG capsule Take 12.5 mg by mouth daily.     ? HYDROcodone-acetaminophen (NORCO/VICODIN) 5-325 MG tablet One tablet every four hours as needed for acute pain.  Limit of five days per [place] statue. (Patient taking differently: One tablet every four hours as needed for acute pain.  Limit of five days per [employer] statue.) 30 tablet 0  ? levothyroxine (SYNTHROID, LEVOTHROID) 88 MCG tablet Take 88 mcg by mouth daily before breakfast.    ? loperamide (  IMODIUM) 2 MG capsule Take 2-4 mg by mouth as needed for diarrhea or loose stools.    ? MAGNESIUM PO Take 1 tablet by mouth daily.    ? MELATONIN PO Take 3 mg by mouth at bedtime.    ? Multiple Vitamins-Minerals (PRESERVISION AREDS 2 PO) Take 1 tablet by mouth 2 (two) times daily.    ? omeprazole (PRILOSEC) 20 MG capsule Take 20 mg by mouth 2 (two) times a week. Wednesday and Sundays    ? vitamin B-12 (CYANOCOBALAMIN) 1000 MCG tablet Take 1,000 mcg by mouth every other day.    ? acyclovir (ZOVIRAX) 200 MG capsule Take 200 mg by mouth 5 (five) times daily as needed (fever blisters).  (Patient not taking: Reported on 12/09/2021)    ? ?No current facility-administered medications for this visit.  ? ?Facility-Administered Medications Ordered in Other Visits  ?Medication Dose Route Frequency Provider Last Rate Last Admin  ? fentaNYL (SUBLIMAZE) injection 25-50 mcg  25-50 mcg Intravenous Q5 min PRN Lerry Liner, MD      ? ? ?Allergies as of 12/09/2021 - Review Complete 12/09/2021  ?Allergen Reaction Noted  ? Codeine Nausea And Vomiting 04/26/2013  ? ? ?Family History  ?Problem  Relation Age of Onset  ? Heart failure Mother   ? Cancer Father   ? Colon cancer Neg Hx   ? Inflammatory bowel disease Neg Hx   ? ? ?Social History  ? ?Socioeconomic History  ? Marital status: Married  ?  [place]

## 2021-12-09 ENCOUNTER — Ambulatory Visit (INDEPENDENT_AMBULATORY_CARE_PROVIDER_SITE_OTHER): Payer: Medicare Other | Admitting: Gastroenterology

## 2021-12-09 ENCOUNTER — Encounter: Payer: Self-pay | Admitting: Gastroenterology

## 2021-12-09 ENCOUNTER — Other Ambulatory Visit: Payer: Self-pay

## 2021-12-09 VITALS — BP 128/62 | HR 74 | Temp 97.6°F | Ht 65.0 in | Wt 148.0 lb

## 2021-12-09 DIAGNOSIS — K52831 Collagenous colitis: Secondary | ICD-10-CM | POA: Diagnosis not present

## 2021-12-09 DIAGNOSIS — R634 Abnormal weight loss: Secondary | ICD-10-CM | POA: Diagnosis not present

## 2021-12-09 NOTE — Patient Instructions (Signed)
Continue budesonide 3 mg once daily. ? ?Continue to avoid NSAID products aside from your aspirin that you are taking twice a week. ? ?We will follow-up with you in 3 months.  Please call with any questions or concerns prior to your next visit. ? ?It was great to see you again today!  I am glad you are doing well overall! ? ?Aliene Altes, PA-C ?[facility] ? ?

## 2021-12-27 ENCOUNTER — Ambulatory Visit (INDEPENDENT_AMBULATORY_CARE_PROVIDER_SITE_OTHER): Payer: Medicare Other

## 2021-12-27 ENCOUNTER — Ambulatory Visit
Admission: EM | Admit: 2021-12-27 | Discharge: 2021-12-27 | Disposition: A | Discharge status: Home / Self Care | Payer: Medicare Other | Attending: Family Medicine | Admitting: Family Medicine

## 2021-12-27 DIAGNOSIS — W19XXXA Unspecified fall, initial encounter: Secondary | ICD-10-CM

## 2021-12-27 DIAGNOSIS — S20212A Contusion of left front wall of thorax, initial encounter: Secondary | ICD-10-CM | POA: Diagnosis not present

## 2021-12-27 DIAGNOSIS — R0782 Intercostal pain: Secondary | ICD-10-CM

## 2021-12-27 MED ORDER — LIDOCAINE 5 % EX PTCH: 1.0000 | MEDICATED_PATCH | CUTANEOUS | 0 refills | Status: DC

## 2021-12-27 NOTE — ED Provider Notes (Signed)
?[facility] ? ? ? ?CSN: [id number] ?Arrival date & time: 12/27/21  1352 ? ? ?  ? ?History   ?Chief Complaint ?Chief Complaint  ?Patient presents with  ? Fall  ?  Fell 2 days ago and having pain on left side  ? ? ?HPI ?[name] is a 81 y.o. female.  ? ?Patient presenting today with left rib and mid back pain following a mechanical fall 2 days ago where she landed on the side of her tub on the left posterior ribs.  Having soreness with deep breaths and movement but no chest pain, shortness of breath, abdominal pain, nausea vomiting diarrhea, bruising or swelling to the area.  Did not hit head or lose consciousness and has no subsequent nausea, vomiting, dizziness, visual changes, mental status changes.  No other pain from the fall though did obtain an abrasion to the left forearm that she has been performing home wound care on.  Taking hydrocodone for chronic leg pain and took some Tylenol as well. ? ? ?Past Medical History:  ?Diagnosis Date  ? Arthritis   ? Carotid artery disease ([place])   ? Collagenous colitis 01/2020  ? COPD (chronic obstructive pulmonary disease) ([place])   ? Dysrhythmia   ? palpitations  ? GERD (gastroesophageal reflux disease)   ? Hypercholesteremia   ? Hypertension   ? Hypothyroidism   ? Shortness of breath   ? ? ?Patient Active Problem List  ? Diagnosis Date Noted  ? Loss of weight 07/20/2021  ? Collagenous colitis 06/04/2020  ? Pain in left knee 04/25/2020  ? Tobacco abuse 01/29/2020  ? Diarrhea 12/31/2019  ? Anemia 12/31/2019  ? Colitis, acute 11/30/2019  ? Non-intractable vomiting 11/30/2019  ? 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 History of lumbar fusion 07/02/2019  ? Trochanteric bursitis of right hip 07/02/2019  ? 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 Mammographic breast lesion   ? Carpal tunnel syndrome of right wrist 04/20/2016  ? Essential hypertension 04/10/2014  ? Hyperlipidemia 04/10/2014  ? Carotid artery disease ([place]) 04/10/2014  ? ? ?Past  Surgical History:  ?Procedure Laterality Date  ? ABDOMINAL HYSTERECTOMY    ? APPENDECTOMY    ? BACK SURGERY  2019  ? BIOPSY  01/23/2020  ? Procedure: BIOPSY;  Surgeon: Daneil Dolin, MD;  [facility];  Service: Endoscopy;;  ascending, desending colon, sigmoid  ? BREAST BIOPSY Left 05/04/2017  ? Procedure: BREAST BIOPSY WITH NEEDLE LOCALIZATION;  Surgeon: Aviva Signs, MD;  Location: [facility];  Service: General;  Laterality: Left;  needle loc at 8:00  ? CATARACT EXTRACTION W/PHACO Left 09/30/2014  ? Procedure: CATARACT EXTRACTION PHACO AND INTRAOCULAR LENS PLACEMENT LEFT EYE;  Surgeon: Tonny Branch, MD;  Location: [facility];  Service: Ophthalmology;  Laterality: Left;  CDE:8.79  ? CATARACT EXTRACTION W/PHACO Right 10/17/2014  ? Procedure: CATARACT EXTRACTION PHACO AND INTRAOCULAR LENS PLACEMENT RIGHT EYE;  Surgeon: Tonny Branch, MD;  Location: [facility];  Service: Ophthalmology;  Laterality: Right;  CDE: 7.80  ? CHOLECYSTECTOMY N/A 04/30/2013  ? Procedure: Attempted LAPAROSCOPIC CHOLECYSTECTOMY ; converted to open procedure @ 1510;  Surgeon: Scherry Ran, MD;  Location: [facility];  Service: General;  Laterality: N/A;  ? CHOLECYSTECTOMY N/A 04/30/2013  ? Procedure: CHOLECYSTECTOMY;  Surgeon: Scherry Ran, MD;  Location: [facility];  Service: General;  Laterality: N/A;  procedure started @ 1520  ? COLONOSCOPY  06/11/2004  ? External hemorrhoids, few scattered left-sided diverticula, otherwise normal.  Repeat colonoscopy  in 10 years.  ? COLONOSCOPY N/A 01/23/2020  ? Procedure: COLONOSCOPY;  Surgeon: Daneil Dolin, MD;  Multiple small largemouth diverticula in the sigmoid and descending colon, otherwise normal exam s/p segmental biopsies of right and left colon for histologic study.  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  ? ESOPHAGOGASTRODUODENOSCOPY  06/11/2004  ? Distal esophageal erosions consistent with mild erosive reflux esophagitis, heaped up adenomatous appearing prepyloric antral mucosa  biopsy, single antral prepyloric ulcer biopsied, normal D1 and D2.  ? ESOPHAGOGASTRODUODENOSCOPY  10/07/2004  ? Distal esophageal erosions consistent with erosive reflux esophagitis, otherwise normal esophagus, normal stomach, previously noted ulcer healed completely, normal D1 and D2.  ? LAMINECTOMY  12/26/2017  ? spine surgery Dr Carloyn Manner  ? TUBAL LIGATION    ? ? ?OB History   ?No obstetric history on file. ?  ? ? ? ?Home Medications   ? ?Prior to Admission medications   ?Medication Sig Start Date End Date Taking? Authorizing Provider  ?lidocaine (LIDODERM) 5 % Place 1 patch onto the skin daily. Remove & Discard patch within 12 hours or as directed by MD 12/27/21  Yes Volney American, PA-C  ?acetaminophen (TYLENOL) 500 MG tablet Take 1,000 mg by mouth every 6 (six) hours as needed.    [provider]  ?acyclovir (ZOVIRAX) 200 MG capsule Take 200 mg by mouth 5 (five) times daily as needed (fever blisters).  ?Patient not taking: Reported on 12/09/2021    [provider]  ?albuterol (PROVENTIL HFA;VENTOLIN HFA) 108 (90 BASE) MCG/ACT inhaler Inhale 2 puffs into the lungs every 6 (six) hours as needed for wheezing or shortness of breath.    [provider]  ?ALPRAZolam Duanne Moron) 0.5 MG tablet Take 0.25 mg by mouth daily as needed for anxiety or sleep.  02/06/14   [provider]  ?Ascorbic Acid (VITAMIN C) 1000 MG tablet Take 1,000 mg by mouth daily.    [provider]  ?aspirin EC 81 MG tablet Take 81 mg by mouth 2 (two) times a week. Wednesday and sundays 05/02/13   Felicie Morn, MD  ?atorvastatin (LIPITOR) 40 MG tablet Take 40 mg by mouth daily at 6 PM.     [provider]  ?benazepril (LOTENSIN) 10 MG tablet Take 10 mg by mouth at bedtime.     [provider]  ?budesonide (ENTOCORT EC) 3 MG 24 hr capsule Take 3 capsules (9 mg total) by mouth daily. ?Patient taking differently: Take 3 mg by mouth daily. 03/06/20   Erenest Rasher, PA-C  ?cholecalciferol  (VITAMIN D3) 25 MCG (1000 UNIT) tablet Take 1,000 Units by mouth daily.    [provider]  ?hydrochlorothiazide (MICROZIDE) 12.5 MG capsule Take 12.5 mg by mouth daily.  09/25/14   [provider]  ?HYDROcodone-acetaminophen (NORCO/VICODIN) 5-325 MG tablet One tablet every four hours as needed for acute pain.  Limit of five days per [place] statue. ?Patient taking differently: One tablet every four hours as needed for acute pain.  Limit of five days per [place] statue. 12/04/20   Sanjuana Kava, MD  ?levothyroxine (SYNTHROID, LEVOTHROID) 88 MCG tablet Take 88 mcg by mouth daily before breakfast.    [provider]  ?loperamide (IMODIUM) 2 MG capsule Take 2-4 mg by mouth as needed for diarrhea or loose stools.    [provider]  ?MAGNESIUM PO Take 1 tablet by mouth daily.    [provider]  ?MELATONIN PO Take 3 mg by mouth at bedtime.  [provider]  ?Multiple Vitamins-Minerals (PRESERVISION AREDS 2 PO) Take 1 tablet by mouth 2 (two) times daily.    [provider]  ?omeprazole (PRILOSEC) 20 MG capsule Take 20 mg by mouth 2 (two) times a week. Wednesday and Sundays    [provider]  ?vitamin B-12 (CYANOCOBALAMIN) 1000 MCG tablet Take 1,000 mcg by mouth every other day.    [provider]  ? ? ?Family History ?Family History  ?Problem Relation Age of Onset  ? Heart failure Mother   ? Cancer Father   ? Colon cancer Neg Hx   ? Inflammatory bowel disease Neg Hx   ? ? ?Social History ?Social History  ? ?Tobacco Use  ? Smoking status: Every Day  ?  Packs/day: 0.25  ?  Years: 55.00  ?  Pack years: 13.75  ?  Types: Cigarettes  ? Smokeless tobacco: Never  ? Tobacco comments:  ?  5-6 cigs/day  ?Vaping Use  ? Vaping Use: Never used  ?Substance Use Topics  ? Alcohol use: No  ? Drug use: No  ? ? ? ?Allergies   ?Codeine ? ? ?Review of Systems ?Review of Systems ?Per HPI ? ?Physical Exam ?Triage Vital Signs ?ED Triage Vitals  ?Enc Vitals Group   ?   BP 12/27/21 1419 (!) 151/65  ?   Pulse Rate 12/27/21 1419 65  ?   Resp 12/27/21 1419 16  ?   Temp 12/27/21 1419 98 ?F (36.7 ?C)  ?   Temp Source 12/27/21 1419 Oral  ?   SpO2 12/27/21 1419 95 %  ?

## 2021-12-27 NOTE — ED Triage Notes (Signed)
Pt states she fell 2 days ago when she was getting up from toilet and hit her left side on the tub ? ?Pt states she began to hurt Friday evening and her husband got a rib brace to help with some support ? ?Pt states her pain is mostly on left lower back ?

## 2022-03-01 ENCOUNTER — Encounter (HOSPITAL_COMMUNITY): Payer: Self-pay

## 2022-03-01 ENCOUNTER — Emergency Department (HOSPITAL_COMMUNITY): Payer: Medicare Other

## 2022-03-01 ENCOUNTER — Other Ambulatory Visit: Payer: Self-pay

## 2022-03-01 ENCOUNTER — Emergency Department (HOSPITAL_COMMUNITY)
Admission: EM | Admit: 2022-03-01 | Discharge: 2022-03-02 | Disposition: A | Discharge status: Home / Self Care | Payer: Medicare Other | Attending: Emergency Medicine | Admitting: Emergency Medicine

## 2022-03-01 DIAGNOSIS — Z79899 Other long term (current) drug therapy: Secondary | ICD-10-CM | POA: Insufficient documentation

## 2022-03-01 DIAGNOSIS — R531 Weakness: Secondary | ICD-10-CM | POA: Diagnosis present

## 2022-03-01 DIAGNOSIS — Z7982 Long term (current) use of aspirin: Secondary | ICD-10-CM | POA: Insufficient documentation

## 2022-03-01 LAB — CBC WITH DIFFERENTIAL/PLATELET
Abs Immature Granulocytes: 0.02 10*3/uL (ref 0.00–0.07)
Basophils Absolute: 0 10*3/uL (ref 0.0–0.1)
Basophils Relative: 1 %
Eosinophils Absolute: 0 10*3/uL (ref 0.0–0.5)
Eosinophils Relative: 1 %
HCT: 33.6 % — ABNORMAL LOW (ref 36.0–46.0)
Hemoglobin: 10.6 g/dL — ABNORMAL LOW (ref 12.0–15.0)
Immature Granulocytes: 0 %
Lymphocytes Relative: 5 %
Lymphs Abs: 0.3 10*3/uL — ABNORMAL LOW (ref 0.7–4.0)
MCH: 28 pg (ref 26.0–34.0)
MCHC: 31.5 g/dL (ref 30.0–36.0)
MCV: 88.9 fL (ref 80.0–100.0)
Monocytes Absolute: 0.3 10*3/uL (ref 0.1–1.0)
Monocytes Relative: 5 %
Neutro Abs: 5.2 10*3/uL (ref 1.7–7.7)
Neutrophils Relative %: 88 %
Platelets: 194 10*3/uL (ref 150–400)
RBC: 3.78 MIL/uL — ABNORMAL LOW (ref 3.87–5.11)
RDW: 14.3 % (ref 11.5–15.5)
WBC: 5.9 10*3/uL (ref 4.0–10.5)
nRBC: 0 % (ref 0.0–0.2)

## 2022-03-01 LAB — COMPREHENSIVE METABOLIC PANEL
ALT: 24 U/L (ref 0–44)
AST: 31 U/L (ref 15–41)
Albumin: 3.6 g/dL (ref 3.5–5.0)
Alkaline Phosphatase: 98 U/L (ref 38–126)
Anion gap: 4 — ABNORMAL LOW (ref 5–15)
BUN: 26 mg/dL — ABNORMAL HIGH (ref 8–23)
CO2: 25 mmol/L (ref 22–32)
Calcium: 8.4 mg/dL — ABNORMAL LOW (ref 8.9–10.3)
Chloride: 105 mmol/L (ref 98–111)
Creatinine, Ser: 1.21 mg/dL — ABNORMAL HIGH (ref 0.44–1.00)
GFR, Estimated: 45 mL/min — ABNORMAL LOW (ref >60)
Glucose, Bld: 142 mg/dL — ABNORMAL HIGH (ref 70–99)
Potassium: 4.9 mmol/L (ref 3.5–5.1)
Sodium: 134 mmol/L — ABNORMAL LOW (ref 135–145)
Total Bilirubin: 0.6 mg/dL (ref 0.3–1.2)
Total Protein: 6.2 g/dL — ABNORMAL LOW (ref 6.5–8.1)

## 2022-03-01 LAB — MAGNESIUM: Magnesium: 2 mg/dL (ref 1.7–2.4)

## 2022-03-01 MED ORDER — SODIUM CHLORIDE 0.9 % IV BOLUS
500.0000 mL | Freq: Once | INTRAVENOUS | Status: AC
  Administered 2022-03-01: 500 mL via INTRAVENOUS

## 2022-03-01 NOTE — ED Triage Notes (Signed)
[facility]- c/o lower back pain after falling today after losing her balance. Pt husband states that pt has not been "acting right:" for the last few days. Skin tears to R elbow and R knee. Pt A&O x4

## 2022-03-01 NOTE — Discharge Instructions (Signed)
Follow-up with your primary care doctor or neurology within the week.  Return back to the ER if you have worsening symptoms fevers new pains or any additional concerns.  Some of your symptoms may be from taking Xanax hydrocodone and Lyrica altogether once.  Try to decrease these dosages or spread them out throughout the day.

## 2022-03-01 NOTE — ED Provider Notes (Addendum)
Discussed at length with patient and family regarding possible [facility] Provider Note   CSN: [id number] Arrival date & time: 03/01/22  2000     History  No chief complaint on file.   [name] is a 81 y.o. female.  Patient presents ER chief complaint of episodic weakness.  Family arrived and states that symptoms have been on and off waxing and waning for the past month.  Patient and family state that is typically her right leg that gives her the most problems.  They spoken to the doctor about it several times but have been managing at home in the meanwhile.  Today they state that she felt too weak and just let herself down onto the ground.  Denies any traumatic fall.  Then she was able to get up and walk but happened again while her daughter was moving onto her and she let her down to the ground gently.  Patient states that she just feels weak all over.  Denies any localized weakness or localized numbness.  No reports of headache or chest pain or abdominal pain no fever cough vomiting or diarrhea reported.  Patient states she has chronic back pain and takes Xanax and hydrocodone and pregabalin every day and took all 3 this morning.       Home Medications Prior to Admission medications   Medication Sig Start Date End Date Taking? Authorizing Provider  acetaminophen (TYLENOL) 500 MG tablet Take 1,000 mg by mouth every 6 (six) hours as needed.    [provider]  acyclovir (ZOVIRAX) 200 MG capsule Take 200 mg by mouth 5 (five) times daily as needed (fever blisters).  Patient not taking: Reported on 12/09/2021    [provider]  albuterol (PROVENTIL HFA;VENTOLIN HFA) 108 (90 BASE) MCG/ACT inhaler Inhale 2 puffs into the lungs every 6 (six) hours as needed for wheezing or shortness of breath.    [provider]  ALPRAZolam Duanne Moron) 0.5 MG tablet Take 0.25 mg by mouth daily as needed for anxiety or sleep.  02/06/14   [provider]   Ascorbic Acid (VITAMIN C) 1000 MG tablet Take 1,000 mg by mouth daily.    [provider]  aspirin EC 81 MG tablet Take 81 mg by mouth 2 (two) times a week. Wednesday and sundays 05/02/13   Felicie Morn, MD  atorvastatin (LIPITOR) 40 MG tablet Take 40 mg by mouth daily at 6 PM.     [provider]  benazepril (LOTENSIN) 10 MG tablet Take 10 mg by mouth at bedtime.     [provider]  budesonide (ENTOCORT EC) 3 MG 24 hr capsule Take 3 capsules (9 mg total) by mouth daily. Patient taking differently: Take 3 mg by mouth daily. 03/06/20   Erenest Rasher, PA-C  cholecalciferol (VITAMIN D3) 25 MCG (1000 UNIT) tablet Take 1,000 Units by mouth daily.    [provider]  hydrochlorothiazide (MICROZIDE) 12.5 MG capsule Take 12.5 mg by mouth daily.  09/25/14   [provider]  HYDROcodone-acetaminophen (NORCO/VICODIN) 5-325 MG tablet One tablet every four hours as needed for acute pain.  Limit of five days per [place] statue. Patient taking differently: One tablet every four hours as needed for acute pain.  Limit of five days per [place] statue. 12/04/20   Sanjuana Kava, MD  levothyroxine (SYNTHROID, LEVOTHROID) 88 MCG tablet Take 88 mcg by mouth daily before breakfast.    [provider]  lidocaine (LIDODERM) 5 % Place  1 patch onto the skin daily. Remove & Discard patch within 12 hours or as directed by MD 12/27/21   Volney American, PA-C  loperamide (IMODIUM) 2 MG capsule Take 2-4 mg by mouth as needed for diarrhea or loose stools.    [provider]  MAGNESIUM PO Take 1 tablet by mouth daily.    [provider]  MELATONIN PO Take 3 mg by mouth at bedtime.    [provider]  Multiple Vitamins-Minerals (PRESERVISION AREDS 2 PO) Take 1 tablet by mouth 2 (two) times daily.    [provider]  omeprazole (PRILOSEC) 20 MG capsule Take 20 mg by mouth 2 (two) times a week. Wednesday and Sundays    [provider]  vitamin B-12 (CYANOCOBALAMIN) 1000 MCG tablet Take 1,000 mcg by mouth every other day.    [provider]      Allergies    Codeine    Review of Systems   Review of Systems  Constitutional:  Negative for fever.  HENT:  Negative for ear pain.   Eyes:  Negative for pain.  Respiratory:  Negative for cough.   Cardiovascular:  Negative for chest pain.  Gastrointestinal:  Negative for abdominal pain.  Genitourinary:  Negative for flank pain.  Musculoskeletal:  Negative for back pain.  Skin:  Negative for rash.  Neurological:  Negative for headaches.    Physical Exam Updated Vital Signs BP (!) 116/48   Pulse 83   Temp 99.1 F (37.3 C)   Resp 19   Ht '5\' 5"'$  (1.651 m)   Wt 68 kg   SpO2 92%   BMI 24.95 kg/m  Physical Exam Constitutional:      General: She is not in acute distress.    Appearance: Normal appearance.  HENT:     Head: Normocephalic.     Nose: Nose normal.  Eyes:     Extraocular Movements: Extraocular movements intact.  Cardiovascular:     Rate and Rhythm: Normal rate.  Pulmonary:     Effort: Pulmonary effort is normal.  Abdominal:     Tenderness: There is no abdominal tenderness. There is no guarding or rebound.  Musculoskeletal:        General: Normal range of motion.     Cervical back: Normal range of motion.  Neurological:     General: No focal deficit present.     Mental Status: She is alert and oriented to person, place, and time. Mental status is at baseline.     Cranial Nerves: No cranial nerve deficit.     Motor: No weakness.     Comments: No upper extremity or lower extremity drift noted.  5/5 strength bilateral upper and lower extremities.     ED Results / Procedures / Treatments   Labs (all labs ordered are listed, but only abnormal results are displayed) Labs Reviewed  CBC WITH DIFFERENTIAL/PLATELET - Abnormal; Notable for the following components:      Result Value   RBC 3.78 (*)    Hemoglobin 10.6 (*)    HCT  33.6 (*)    Lymphs Abs 0.3 (*)    All other components within normal limits  COMPREHENSIVE METABOLIC PANEL - Abnormal; Notable for the following components:   Sodium 134 (*)    Glucose, Bld 142 (*)    BUN 26 (*)    Creatinine, Ser 1.21 (*)    Calcium 8.4 (*)    Total Protein 6.2 (*)    GFR, Estimated 45 (*)  Anion gap 4 (*)    All other components within normal limits  MAGNESIUM  URINALYSIS, ROUTINE W REFLEX MICROSCOPIC  RAPID URINE DRUG SCREEN, HOSP PERFORMED    EKG EKG Interpretation  Date/Time:  Monday March 01 2022 20:24:38 EDT Ventricular Rate:  87 PR Interval:  176 QRS Duration: 86 QT Interval:  352 QTC Calculation: 424 R Axis:   49 Text Interpretation: Sinus rhythm Consider anterior infarct Confirmed by Thamas Jaegers ([id number]) on 03/01/2022 8:39:06 PM  Radiology CT Head Wo Contrast  Result Date: 03/01/2022 CLINICAL DATA:  Altered mental status. EXAM: CT HEAD WITHOUT CONTRAST TECHNIQUE: Contiguous axial images were obtained from the base of the skull through the vertex without intravenous contrast. RADIATION DOSE REDUCTION: This exam was performed according to the departmental dose-optimization program which includes automated exposure control, adjustment of the mA and/or kV according to patient size and/or use of iterative reconstruction technique. COMPARISON:  Head CT dated 10/02/2012. FINDINGS: Brain: Mild age-related atrophy and chronic microvascular ischemic changes. Small focus of calcification in the left parietal cortex similar to prior CT, likely sequela of prior insult. There is no acute intracranial hemorrhage. No mass effect or midline shift. No extra-axial fluid collection. Vascular: No hyperdense vessel or unexpected calcification. Skull: Normal. Negative for fracture or focal lesion. Sinuses/Orbits: No acute finding. Other: None IMPRESSION: 1. No acute intracranial pathology. 2. Mild age-related atrophy and chronic microvascular ischemic changes. Electronically  Signed   By: Anner Crete M.D.   On: 03/01/2022 22:18    Procedures Procedures    Medications Ordered in ED Medications  sodium chloride 0.9 % bolus 500 mL (0 mLs Intravenous Stopped 03/01/22 2243)    ED Course/ Medical Decision Making/ A&P                           Medical Decision Making Amount and/or Complexity of Data Reviewed Labs: ordered. Radiology: ordered.   History from family at bedside.  They state that symptoms have not been acute but more developing over the last month or so.  They brought her in today because her legs simply gave out today.  Review of external records shows rib contusion injury sustained December 27, 2021.  Cardiac monitor shows sinus rhythm.  Studies included labs White count of 6, hemoglobin of 10 chemistry unremarkable.  Patient denies any pain or discomfort at this time.  Work-up otherwise unremarkable.  CT scan of the brain pursued given report of possible fall and generalized weakness.  Scan does not show any acute findings per radiology.  On my exam she has no focal neurodeficit.  5 out of 5 strength all extremities.  Urinalysis ordered but the patient states that she just does not feel she can use the pure wick.  Prefers to go home at this time.  We will trial ambulation for discharge home.  Patient able to walk with a walker but requiring significant assistance.  Concern for stability at home.  Nursing home placement or rehab facility placement.  However patient declines prefers to go home.  Advised transitioning to a wheelchair for the interim.  Advise follow-up with her primary care doctor for additional resources.  Advised immediate return for worsening symptoms or any additional concerns.   Final Clinical Impression(s) / ED Diagnoses Final diagnoses:  Generalized weakness    Rx / DC Orders ED Discharge Orders     None         Luna Fuse, MD 03/01/22 [phone]  Luna Fuse, MD 03/02/22 [phone]

## 2022-03-04 ENCOUNTER — Encounter: Payer: Self-pay | Admitting: Neurology

## 2022-03-10 ENCOUNTER — Ambulatory Visit: Payer: Medicare Other | Admitting: Cardiovascular Disease

## 2022-03-10 ENCOUNTER — Ambulatory Visit (INDEPENDENT_AMBULATORY_CARE_PROVIDER_SITE_OTHER): Payer: Medicare Other | Admitting: Neurology

## 2022-03-10 ENCOUNTER — Encounter: Payer: Self-pay | Admitting: Neurology

## 2022-03-10 VITALS — BP 136/66 | HR 59 | Ht 65.0 in

## 2022-03-10 DIAGNOSIS — R531 Weakness: Secondary | ICD-10-CM | POA: Diagnosis not present

## 2022-03-10 DIAGNOSIS — R269 Unspecified abnormalities of gait and mobility: Secondary | ICD-10-CM | POA: Diagnosis not present

## 2022-03-10 DIAGNOSIS — R5381 Other malaise: Secondary | ICD-10-CM | POA: Diagnosis not present

## 2022-03-10 DIAGNOSIS — G6289 Other specified polyneuropathies: Secondary | ICD-10-CM | POA: Diagnosis not present

## 2022-03-10 DIAGNOSIS — W19XXXA Unspecified fall, initial encounter: Secondary | ICD-10-CM

## 2022-03-10 NOTE — Progress Notes (Signed)
Referring Provider: Lemmie Evens, MD Primary Care Physician:  Lemmie Evens, MD Primary GI Physician: Dr. Gala Romney  Chief Complaint  Patient presents with   Follow-up    HPI:   [name] is a 81 y.o. female  presenting today for follow-up of collagenous colitis.  Collagenous colitis was diagnosed in May 2021.  Prior stool studies including C. difficile and GI pathogen panel as well as celiac serologies and TSH were within normal limits.  She has been treated with budesonide and we have tapered off budesonide a few times, but after a few months, she has return of diarrhea with no improvement with imodium, requiring recurrent budesonide prescriptions, now maintaining on budesonide 3 mg daily since February 2023.   Prior to collagenous colitis diagnosis, we tried several medications including Imodium, Colestid, dicyclomine, none of which improved her diarrhea.  Last seen in our office 12/09/2021.  She was taking budesonide 3 mg daily and had not had any recurrent diarrhea.  Her bowels were moving daily without BRBPR or melena.  She was eating well and had gained 9 pounds since her last visit.  No other significant GI symptoms.  Recommended continuing budesonide 3 mg daily, follow-up in 3 months.  Today:  Reports he is doing very well today.  She is not having any problems with diarrhea.  Last week, she did have an episode of diarrhea, but this resolved with Imodium.  She does not take Imodium unless she needs it and usually does not require it.  2 to 3 weeks ago, she started taking budesonide 3 mg every other day rather than daily.  Denies BRBPR, melena, abdominal pain, nausea, vomiting, unintentional weight loss.   Past Medical History:  Diagnosis Date   Arthritis    Carotid artery disease ([place])    Collagenous colitis 01/2020   COPD (chronic obstructive pulmonary disease) ([facility])    Dysrhythmia    palpitations   GERD (gastroesophageal reflux disease)    Hypercholesteremia     Hypertension    Hypothyroidism    Shortness of breath     Past Surgical History:  Procedure Laterality Date   ABDOMINAL HYSTERECTOMY     APPENDECTOMY     BACK SURGERY  2019   BIOPSY  01/23/2020   Procedure: BIOPSY;  Surgeon: Daneil Dolin, MD;  Location: [facility];  Service: Endoscopy;;  ascending, desending colon, sigmoid   BREAST BIOPSY Left 05/04/2017   Procedure: BREAST BIOPSY WITH NEEDLE LOCALIZATION;  Surgeon: Aviva Signs, MD;  Location: [facility];  Service: General;  Laterality: Left;  needle loc at 8:00   CATARACT EXTRACTION W/PHACO Left 09/30/2014   Procedure: CATARACT EXTRACTION PHACO AND INTRAOCULAR LENS PLACEMENT LEFT EYE;  Surgeon: Tonny Branch, MD;  Location: [facility];  Service: Ophthalmology;  Laterality: Left;  CDE:8.79   CATARACT EXTRACTION W/PHACO Right 10/17/2014   Procedure: CATARACT EXTRACTION PHACO AND INTRAOCULAR LENS PLACEMENT RIGHT EYE;  Surgeon: Tonny Branch, MD;  Location: [facility];  Service: Ophthalmology;  Laterality: Right;  CDE: 7.80   CHOLECYSTECTOMY N/A 04/30/2013   Procedure: Attempted LAPAROSCOPIC CHOLECYSTECTOMY ; converted to open procedure @ 1510;  Surgeon: Scherry Ran, MD;  Location: [facility];  Service: General;  Laterality: N/A;   CHOLECYSTECTOMY N/A 04/30/2013   Procedure: CHOLECYSTECTOMY;  Surgeon: Scherry Ran, MD;  Location: [facility];  Service: General;  Laterality: N/A;  procedure started @ 1520   COLONOSCOPY  06/11/2004   External hemorrhoids, few scattered left-sided diverticula, otherwise normal.  Repeat colonoscopy in  10 years.   COLONOSCOPY N/A 01/23/2020   Procedure: COLONOSCOPY;  Surgeon: Daneil Dolin, MD;  Multiple small [place] diverticula in the sigmoid and descending colon, otherwise normal exam s/p segmental biopsies of right and left colon for histologic study.  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   ESOPHAGOGASTRODUODENOSCOPY  06/11/2004   Distal esophageal erosions consistent with mild erosive reflux  esophagitis, heaped up adenomatous appearing prepyloric antral mucosa biopsy, single antral prepyloric ulcer biopsied, normal D1 and D2.   ESOPHAGOGASTRODUODENOSCOPY  10/07/2004   Distal esophageal erosions consistent with erosive reflux esophagitis, otherwise normal esophagus, normal stomach, previously noted ulcer healed completely, normal D1 and D2.   LAMINECTOMY  12/26/2017   spine surgery Dr Carloyn Manner   TUBAL LIGATION      Current Outpatient Medications  Medication Sig Dispense Refill   acetaminophen (TYLENOL) 500 MG tablet Take 1,000 mg by mouth every 6 (six) hours as needed.     acyclovir (ZOVIRAX) 200 MG capsule Take 200 mg by mouth 5 (five) times daily as needed (fever blisters).     albuterol (PROVENTIL HFA;VENTOLIN HFA) 108 (90 BASE) MCG/ACT inhaler Inhale 2 puffs into the lungs every 6 (six) hours as needed for wheezing or shortness of breath.     ALPRAZolam (XANAX) 0.5 MG tablet Take 0.25 mg by mouth daily as needed for anxiety or sleep.      Ascorbic Acid (VITAMIN C) 1000 MG tablet Take 1,000 mg by mouth daily.     aspirin EC 81 MG tablet Take 81 mg by mouth 2 (two) times a week. Wednesday and sundays     atorvastatin (LIPITOR) 40 MG tablet Take 40 mg by mouth daily at 6 PM.      benazepril (LOTENSIN) 10 MG tablet Take 10 mg by mouth at bedtime.      budesonide (ENTOCORT EC) 3 MG 24 hr capsule Take 3 capsules (9 mg total) by mouth daily. (Patient taking differently: Take 3 mg by mouth daily. Pt takes one every other day.) 90 capsule 1   cholecalciferol (VITAMIN D3) 25 MCG (1000 UNIT) tablet Take 1,000 Units by mouth daily.     hydrochlorothiazide (MICROZIDE) 12.5 MG capsule Take 12.5 mg by mouth daily.      HYDROcodone-acetaminophen (NORCO/VICODIN) 5-325 MG tablet One tablet every four hours as needed for acute pain.  Limit of five days per [place] statue. (Patient taking differently: One tablet every four hours as needed for acute pain.  Limit of five days per [place] statue.) 30  tablet 0   levothyroxine (SYNTHROID, LEVOTHROID) 88 MCG tablet Take 88 mcg by mouth daily before breakfast.     loperamide (IMODIUM) 2 MG capsule Take 2-4 mg by mouth as needed for diarrhea or loose stools.     MAGNESIUM PO Take 1 tablet by mouth daily.     MELATONIN PO Take 3 mg by mouth at bedtime.     omeprazole (PRILOSEC) 20 MG capsule Take 20 mg by mouth 2 (two) times a week. Wednesday and Sundays     UNABLE TO FIND Med Name: hair, skin, and nail supplement.     No current facility-administered medications for this visit.   Facility-Administered Medications Ordered in Other Visits  Medication Dose Route Frequency Provider Last Rate Last Admin   fentaNYL (SUBLIMAZE) injection 25-50 mcg  25-50 mcg Intravenous Q5 min PRN Lerry Liner, MD        Allergies as of 03/11/2022 - Review Complete 03/11/2022  Allergen Reaction Noted   Codeine  Nausea And Vomiting 04/26/2013    Family History  Problem Relation Age of Onset   Heart failure Mother    Cancer Father    Colon cancer Neg Hx    Inflammatory bowel disease Neg Hx     Social History   Socioeconomic History   Marital status: Married    Spouse name: Not on file   Number of children: Not on file   Years of education: Not on file   Highest education level: Not on file  Occupational History   Not on file  Tobacco Use   Smoking status: Every Day    Packs/day: 0.25    Years: 55.00    Total pack years: 13.75    Types: Cigarettes   Smokeless tobacco: Never   Tobacco comments:    5-6 cigs/day  Vaping Use   Vaping Use: Never used  Substance and Sexual Activity   Alcohol use: No   Drug use: No   Sexual activity: Yes    Birth control/protection: Surgical  Other Topics Concern   Not on file  Social History Narrative   Not on file   Social Determinants of Health   Financial Resource Strain: Not on file  Food Insecurity: Not on file  Transportation Needs: Not on file  Physical Activity: Not on file  Stress: Not on  file  Social Connections: Not on file    Review of Systems: Gen: Denies fever, chills, cold or flulike symptoms, presyncope, syncope. CV: Denies chest pain, palpitations. Resp: Denies dyspnea, cough. GI: See HPI. Heme: See HPI  Physical Exam: BP (!) 170/66   Pulse 67   Temp 97.7 F (36.5 C) (Temporal)   Ht '5\' 5"'$  (1.651 m)   Wt 146 lb 6.4 oz (66.4 kg)   BMI 24.36 kg/m  General:   Alert and oriented. No distress noted. Pleasant and cooperative.  Head:  Normocephalic and atraumatic. Eyes:  Conjuctiva clear without scleral icterus. Heart:  S1, S2 present without murmurs appreciated. Lungs:  Clear to auscultation bilaterally. No wheezes, rales, or rhonchi. No distress.  Abdomen:  +BS, soft, non-tender and non-distended. No rebound or guarding. No HSM or masses noted. Msk:  Symmetrical without gross deformities. Normal posture. Extremities:  Without edema. Neurologic:  Alert and  oriented x4 Psych:  Normal mood and affect.    Assessment:  81 year old female with history of collagenous colitis diagnosed in May 2021 presenting today for follow-up.  She has been treated with budesonide on several occasions and we have tried to taper off budesonide, but within a few months, she always has return of significant diarrhea requiring recurrent therapy with budesonide as she is unable to control her symptoms with Imodium.  She has been maintaining on lowest effective dose of budesonide since February 2023.  She was taking 3 mg daily from February until the beginning of June when she decreased to 3 mg every other day.  She has continued to do well without any significant diarrhea.  She did have a single episode of breakthrough diarrhea last week, but this responded very well to Imodium.  No alarm symptoms.  At this point, I recommend we continue budesonide at the lowest effective dose to keep her symptoms controlled. In general it is recommended to continue the lowest effective dose for 6 to 12  months for patient who have had relapsing symptoms (referenced up to date).  As she is doing well, we will keep her on every other day dosing for the next 3 months and  have her follow-up in the office to see how she is doing at that time.  Of note, patient does have history of osteopenia with last bone density scan in 2019.  She has been on budesonide for quite some time, I have recommended updating DEXA scan at this time.  She is already on vitamin D supplementation.  Recommended adding calcium supplement as well.   Plan:  Continue budesonide 3 mg every other day. Use Imodium as needed for breakthrough diarrhea. DEXA scan. Start 1000 g calcium supplementation daily.  Patient will pick this up over-the-counter. Continue vitamin D daily. Follow-up in 3 months or sooner if needed.   Aliene Altes, PA-C [facility] 03/11/2022

## 2022-03-10 NOTE — Patient Instructions (Signed)
Continue current medications, do not take the Xanax in combination with hydrocodone or pregabalin We will obtain B12 and thyroid studies today Referral to Home services Follow-up in 6 months or sooner if worse

## 2022-03-10 NOTE — Progress Notes (Signed)
[facility]  PATIENT: [name] DOB: [birth year]  REQUESTING CLINICIAN: Lemmie Evens, MD HISTORY FROM: Patient and family  REASON FOR VISIT: Generalized weakness    HISTORICAL  CHIEF COMPLAINT:  Chief Complaint  Patient presents with   New Patient (Initial Visit)    Rm 12. Accompanied by husband and daughter. NP/internal ED referral for weakness.    HISTORY OF PRESENT ILLNESS:  This is a 81 year old woman past medical history of chronic pain, hypertension, hypothyroidism, COPD, GERD and gait instability who is presenting after being seen in the hospital for generalized weakness and fall.  Patient reports on June 12, she woke up was feeling pain and took her typical medication which include hydrocodone, pregabalin and Xanax at the same time.  Hours later she was trying to walk to the floor and her legs felt extremely weak and she fell on the floor.  She reported she could not get up and a husband could not help her.  EMS was called and patient presented to the ED.  In the ED initial work-up including head CT was all negative.  Patient episode was attributed to her combination of pain medication with benzodiazepine.  Daughter reported back in 2018 and in 2019 she had 2 back surgeries and since then she has not had a lot of improvement in her gait and in her chronic pain.  She has not been back to her normal self.  She does complain of leg pain mostly from the knee down, describing as aching pain, sometimes numbness for which she takes pregabalin and hydrocodone.  She had tried Gabapentin in  the past. The family reports multiple falls, in the bathroom, in the kitchen causing multiple bruises.  Denies any head severe head injury.  She has not done physical therapy lately due to transportation issues.   HOME PT   OTHER MEDICAL CONDITIONS: Chronic pain, Hypertension, COPD, Hypothyroid, GERD, Gait abnormality, history of lumbar surgery    REVIEW OF SYSTEMS: Full  14 system review of systems performed and negative with exception of: as noted in the HPI   ALLERGIES: Allergies  Allergen Reactions   Codeine Nausea And Vomiting    HOME MEDICATIONS: Outpatient Medications Prior to Visit  Medication Sig Dispense Refill   acetaminophen (TYLENOL) 500 MG tablet Take 1,000 mg by mouth every 6 (six) hours as needed.     acyclovir (ZOVIRAX) 200 MG capsule Take 200 mg by mouth 5 (five) times daily as needed (fever blisters).     albuterol (PROVENTIL HFA;VENTOLIN HFA) 108 (90 BASE) MCG/ACT inhaler Inhale 2 puffs into the lungs every 6 (six) hours as needed for wheezing or shortness of breath.     ALPRAZolam (XANAX) 0.5 MG tablet Take 0.25 mg by mouth daily as needed for anxiety or sleep.      Ascorbic Acid (VITAMIN C) 1000 MG tablet Take 1,000 mg by mouth daily.     aspirin EC 81 MG tablet Take 81 mg by mouth 2 (two) times a week. Wednesday and sundays     atorvastatin (LIPITOR) 40 MG tablet Take 40 mg by mouth daily at 6 PM.      benazepril (LOTENSIN) 10 MG tablet Take 10 mg by mouth at bedtime.      budesonide (ENTOCORT EC) 3 MG 24 hr capsule Take 3 capsules (9 mg total) by mouth daily. (Patient taking differently: Take 3 mg by mouth daily. Pt takes one every other day.) 90 capsule 1   cholecalciferol (VITAMIN D3) 25 MCG (  1000 UNIT) tablet Take 1,000 Units by mouth daily.     hydrochlorothiazide (MICROZIDE) 12.5 MG capsule Take 12.5 mg by mouth daily.      HYDROcodone-acetaminophen (NORCO/VICODIN) 5-325 MG tablet One tablet every four hours as needed for acute pain.  Limit of five days per [place] statue. (Patient taking differently: One tablet every four hours as needed for acute pain.  Limit of five days per [place] statue.) 30 tablet 0   levothyroxine (SYNTHROID, LEVOTHROID) 88 MCG tablet Take 88 mcg by mouth daily before breakfast.     loperamide (IMODIUM) 2 MG capsule Take 2-4 mg by mouth as needed for diarrhea or loose stools.     MAGNESIUM PO Take 1  tablet by mouth daily.     MELATONIN PO Take 3 mg by mouth at bedtime.     Multiple Vitamins-Minerals (PRESERVISION AREDS 2 PO) Take 1 tablet by mouth 2 (two) times daily.     omeprazole (PRILOSEC) 20 MG capsule Take 20 mg by mouth 2 (two) times a week. Wednesday and Sundays     UNABLE TO FIND Med Name: hair, skin, and nail supplement.     lidocaine (LIDODERM) 5 % Place 1 patch onto the skin daily. Remove & Discard patch within 12 hours or as directed by MD 30 patch 0   vitamin B-12 (CYANOCOBALAMIN) 1000 MCG tablet Take 1,000 mcg by mouth every other day.     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   COPD (chronic obstructive pulmonary disease) (HCC)    Dysrhythmia    palpitations   GERD (gastroesophageal reflux disease)    Hypercholesteremia    Hypertension    Hypothyroidism    Shortness of breath     PAST SURGICAL HISTORY: Past Surgical History:  Procedure Laterality Date   ABDOMINAL HYSTERECTOMY     APPENDECTOMY     BACK SURGERY  2019   BIOPSY  01/23/2020   Procedure: BIOPSY;  Surgeon: Daneil Dolin, MD;  Location: [facility];  Service: Endoscopy;;  ascending, desending colon, sigmoid   BREAST BIOPSY Left 05/04/2017   Procedure: BREAST BIOPSY WITH NEEDLE LOCALIZATION;  Surgeon: Aviva Signs, MD;  Location: [facility];  Service: General;  Laterality: Left;  needle loc at 8:00   CATARACT EXTRACTION W/PHACO Left 09/30/2014   Procedure: CATARACT EXTRACTION PHACO AND INTRAOCULAR LENS PLACEMENT LEFT EYE;  Surgeon: Tonny Branch, MD;  Location: [facility];  Service: Ophthalmology;  Laterality: Left;  CDE:8.79   CATARACT EXTRACTION W/PHACO Right 10/17/2014   Procedure: CATARACT  EXTRACTION PHACO AND INTRAOCULAR LENS PLACEMENT RIGHT EYE;  Surgeon: Tonny Branch, MD;  Location: [facility];  Service: Ophthalmology;  Laterality: Right;  CDE: 7.80   CHOLECYSTECTOMY N/A 04/30/2013   Procedure: Attempted LAPAROSCOPIC CHOLECYSTECTOMY ; converted to open procedure @ 1510;  Surgeon: Scherry Ran, MD;  Location: [facility];  Service: General;  Laterality: N/A;   CHOLECYSTECTOMY N/A 04/30/2013   Procedure: CHOLECYSTECTOMY;  Surgeon: Scherry Ran, MD;  Location: [facility];  Service: General;  Laterality: N/A;  procedure started @ 1520   COLONOSCOPY  06/11/2004   External hemorrhoids, few scattered left-sided diverticula, otherwise normal.  Repeat colonoscopy in  10 years.   COLONOSCOPY N/A 01/23/2020   Procedure: COLONOSCOPY;  Surgeon: Daneil Dolin, MD;  Multiple small [place] diverticula in the sigmoid and descending colon, otherwise normal exam s/p segmental biopsies of right and left colon for histologic study.  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   ESOPHAGOGASTRODUODENOSCOPY  06/11/2004   Distal esophageal erosions consistent with mild erosive reflux esophagitis, heaped up adenomatous appearing prepyloric antral mucosa biopsy, single antral prepyloric ulcer biopsied, normal D1 and D2.   ESOPHAGOGASTRODUODENOSCOPY  10/07/2004   Distal esophageal erosions consistent with erosive reflux esophagitis, otherwise normal esophagus, normal stomach, previously noted ulcer healed completely, normal D1 and D2.   LAMINECTOMY  12/26/2017   spine surgery Dr Carloyn Manner   TUBAL LIGATION      FAMILY HISTORY: Family History  Problem Relation Age of Onset   Heart failure Mother    Cancer Father    Colon cancer Neg Hx    Inflammatory bowel disease Neg Hx     SOCIAL HISTORY: Social History   Socioeconomic History   Marital status: Married    Spouse name: Not on file   Number of children: Not on file   Years of education: Not on file   Highest education level: Not on  file  Occupational History   Not on file  Tobacco Use   Smoking status: Every Day    Packs/day: 0.25    Years: 55.00    Total pack years: 13.75    Types: Cigarettes   Smokeless tobacco: Never   Tobacco comments:    5-6 cigs/day  Vaping Use   Vaping Use: Never used  Substance and Sexual Activity   Alcohol use: No   Drug use: No   Sexual activity: Yes    Birth control/protection: Surgical  Other Topics Concern   Not on file  Social History Narrative   Not on file   Social Determinants of Health   Financial Resource Strain: Not on file  Food Insecurity: Not on file  Transportation Needs: Not on file  Physical Activity: Not on file  Stress: Not on file  Social Connections: Not on file  Intimate Partner Violence: Not on file    PHYSICAL EXAM  GENERAL EXAM/CONSTITUTIONAL: Vitals:  Vitals:   03/10/22 1400  BP: 136/66  Pulse: (!) 59  Height: '5\' 5"'$  (1.651 m)   Body mass index is 24.95 kg/m. Wt Readings from Last 3 Encounters:  03/01/22 149 lb 14.6 oz (68 kg)  12/09/21 148 lb (67.1 kg)  07/20/21 139 lb (63 kg)   Patient is in no distress; well developed, nourished and groomed; neck is supple  EYES: Pupils round and reactive to light, Visual fields full to confrontation, Extraocular movements intacts,   MUSCULOSKELETAL: Gait, strength, tone, movements noted in Neurologic exam below  NEUROLOGIC: MENTAL STATUS:      No data to display         awake, alert, oriented to person, place and time recent and remote memory intact normal attention and concentration language fluent, comprehension intact, naming intact fund of knowledge appropriate  CRANIAL NERVE:  2nd, 3rd, 4th, 6th - pupils equal and reactive to light, visual fields full to confrontation, extraocular muscles intact, no nystagmus 5th - facial sensation symmetric 7th - facial strength symmetric 8th - hearing intact 9th - palate elevates symmetrically, uvula midline 11th - shoulder shrug  symmetric 12th - tongue protrusion midline  MOTOR:  normal bulk and tone, full strength in the BUE, BLE  SENSORY:  normal  and symmetric to light touch  COORDINATION:  finger-nose-finger, fine finger movements normal  REFLEXES:  deep tendon reflexes present and symmetric, 2+ in the BUE and 1+ knees bilaterally   GAIT/STATION:  Uses a wheelchair but able to stand and walk unassisted. Gait is hesitant and wide base.    DIAGNOSTIC DATA (LABS, IMAGING, TESTING) - I reviewed patient records, labs, notes, testing and imaging myself where available.  Lab Results  Component Value Date   WBC 5.9 03/01/2022   HGB 10.6 (L) 03/01/2022   HCT 33.6 (L) 03/01/2022   MCV 88.9 03/01/2022   PLT 194 03/01/2022      Component Value Date/Time   NA 134 (L) 03/01/2022 2119   NA 138 06/13/2020 1209   K 4.9 03/01/2022 2119   CL 105 03/01/2022 2119   CO2 25 03/01/2022 2119   GLUCOSE 142 (H) 03/01/2022 2119   BUN 26 (H) 03/01/2022 2119   BUN 19 06/13/2020 1209   CREATININE 1.21 (H) 03/01/2022 2119   CREATININE 1.06 (H) 01/18/2020 1215   CALCIUM 8.4 (L) 03/01/2022 2119   PROT 6.2 (L) 03/01/2022 2119   PROT 7.1 12/23/2020 0937   ALBUMIN 3.6 03/01/2022 2119   ALBUMIN 4.8 (H) 12/23/2020 0937   AST 31 03/01/2022 2119   ALT 24 03/01/2022 2119   ALKPHOS 98 03/01/2022 2119   BILITOT 0.6 03/01/2022 2119   BILITOT 0.4 12/23/2020 0937   GFRNONAA 45 (L) 03/01/2022 2119   GFRNONAA 41 (L) 12/03/2019 1308   GFRAA 60 06/13/2020 1209   GFRAA 47 (L) 12/03/2019 1308   Lab Results  Component Value Date   CHOL 155 12/23/2020   HDL 63 12/23/2020   LDLCALC 69 12/23/2020   TRIG 130 12/23/2020   CHOLHDL 2.5 12/23/2020   No results found for: "HGBA1C" No results found for: "VITAMINB12" Lab Results  Component Value Date   TSH 0.96 01/04/2020    Head CT 03/01/22 1. No acute intracranial pathology. 2. Mild age-related atrophy and chronic microvascular ischemic changes.    ASSESSMENT AND PLAN  81  y.o. year old female with past medical history including chronic pain, lumbar surgery, peripheral neuropathy, hypertension, hypothyroidism, vitamin B12 deficiency who is presenting with generalized weakness, gait abnormality and polyneuropathy.  Patient reports on February 12 she fell because she felt generally weak but on that morning she took a combination of Xanax, hydrocodone and pregabalin.  I informed patient that the combination of those medications can definitely make people weak and lethargic, and this is most likely what happened.  I informed her if possible not to take all those medications at the same time, mainly not to take the Xanax with pregabalin or Xanax with hydrocodone because it would potentiate the effect of those medications.  At this time I will refer the patient for physical therapy due to generalized deconditioning.  They report due to transportation issue it would be difficult for them to go to PT, therefore we will request home PT.  Continue other medications, I will check a TSH and vitamin B12 today follow-up in 6 months   1. Generalized weakness   2. Gait abnormality   3. Other polyneuropathy   4. Physical deconditioning   5. Fall, initial encounter     Patient Instructions  Continue current medications, do not take the Xanax in combination with hydrocodone or pregabalin We will obtain B12 and thyroid studies today Referral to Home services Follow-up in 6 months or sooner if worse    Orders Placed This Encounter  Procedures   Vitamin B12   Thyroid Panel With TSH   Ambulatory referral to Home Health    No orders of the defined types were placed in this encounter.   Return in about 6 months (around 09/09/2022).  I have spent a total of 65 minutes dedicated to this patient today, preparing to see patient, performing a medically appropriate examination and evaluation, ordering tests and/or medications and procedures, and counseling and educating the  patient/family/caregiver; independently interpreting result and communicating results to the family/patient/caregiver; and documenting clinical information in the electronic medical record.   Alric Ran, MD 03/10/2022, 5:02 PM  [facility] [address], [place], [place] [address] [phone]

## 2022-03-11 ENCOUNTER — Other Ambulatory Visit: Payer: Self-pay | Admitting: Gastroenterology

## 2022-03-11 ENCOUNTER — Encounter: Payer: Self-pay | Admitting: Gastroenterology

## 2022-03-11 ENCOUNTER — Ambulatory Visit (INDEPENDENT_AMBULATORY_CARE_PROVIDER_SITE_OTHER): Payer: Medicare Other | Admitting: Gastroenterology

## 2022-03-11 ENCOUNTER — Telehealth: Payer: Self-pay | Admitting: Neurology

## 2022-03-11 ENCOUNTER — Encounter: Payer: Self-pay | Admitting: *Deleted

## 2022-03-11 VITALS — BP 170/66 | HR 67 | Temp 97.7°F | Ht 65.0 in | Wt 146.4 lb

## 2022-03-11 DIAGNOSIS — M810 Age-related osteoporosis without current pathological fracture: Secondary | ICD-10-CM

## 2022-03-11 DIAGNOSIS — M8580 Other specified disorders of bone density and structure, unspecified site: Secondary | ICD-10-CM | POA: Diagnosis not present

## 2022-03-11 DIAGNOSIS — K52831 Collagenous colitis: Secondary | ICD-10-CM

## 2022-03-11 LAB — VITAMIN B12: Vitamin B-12: 1119 pg/mL (ref 232–1245)

## 2022-03-11 LAB — THYROID PANEL WITH TSH
Free Thyroxine Index: 2.7 (ref 1.2–4.9)
T3 Uptake Ratio: 27 % (ref 24–39)
T4, Total: 10 ug/dL (ref 4.5–12.0)
TSH: 2.36 u[IU]/mL (ref 0.450–4.500)

## 2022-03-11 NOTE — Patient Instructions (Signed)
Continue taking budesonide 3 mg every other day.  Let me know when you are needing a refill on your medication.  You may use Imodium as needed for breakthrough diarrhea if this occurs.  If you have persistent diarrhea, please let me know.  We will arrange you to have a bone density study in the near future at [place] start taking at 1000 mg of calcium daily.  You can pick this up over-the-counter.  Continue taking your vitamin D supplement daily.  We will follow-up with you in 3 months.  Do not hesitate to call sooner if you have any questions or concerns.  It was great to see you again today!  Aliene Altes, PA-C [facility]

## 2022-03-11 NOTE — Telephone Encounter (Signed)
Sent to Brittany with [facility] to see if she will be able to take the patient.  

## 2022-03-15 ENCOUNTER — Ambulatory Visit (HOSPITAL_COMMUNITY)
Admission: RE | Admit: 2022-03-15 | Discharge: 2022-03-15 | Disposition: A | Discharge status: Home / Self Care | Payer: Medicare Other | Source: Ambulatory Visit | Attending: Gastroenterology | Admitting: Gastroenterology

## 2022-03-15 DIAGNOSIS — M810 Age-related osteoporosis without current pathological fracture: Secondary | ICD-10-CM | POA: Insufficient documentation

## 2022-03-19 ENCOUNTER — Telehealth: Payer: Self-pay

## 2022-03-19 NOTE — Telephone Encounter (Signed)
Rx has been called in. Pharmacist said it may be Monday before it is ready.

## 2022-03-19 NOTE — Telephone Encounter (Signed)
Pt called requesting a refill on budesonide to be sent to [facility]. Last ov was 03/11/22.

## 2022-03-22 NOTE — Telephone Encounter (Signed)
Pt aware.

## 2022-03-24 ENCOUNTER — Encounter: Payer: Self-pay | Admitting: *Deleted

## 2022-05-05 ENCOUNTER — Ambulatory Visit (HOSPITAL_COMMUNITY)
Admission: RE | Admit: 2022-05-05 | Discharge: 2022-05-05 | Disposition: A | Discharge status: Home / Self Care | Payer: Medicare Other | Source: Ambulatory Visit | Attending: Internal Medicine | Admitting: Internal Medicine

## 2022-05-05 DIAGNOSIS — I6523 Occlusion and stenosis of bilateral carotid arteries: Secondary | ICD-10-CM | POA: Insufficient documentation

## 2022-06-02 ENCOUNTER — Ambulatory Visit: Payer: Medicare Other | Attending: Cardiovascular Disease | Admitting: Cardiovascular Disease

## 2022-06-02 ENCOUNTER — Encounter: Payer: Self-pay | Admitting: Cardiovascular Disease

## 2022-06-02 DIAGNOSIS — E782 Mixed hyperlipidemia: Secondary | ICD-10-CM | POA: Diagnosis not present

## 2022-06-02 DIAGNOSIS — I10 Essential (primary) hypertension: Secondary | ICD-10-CM | POA: Diagnosis not present

## 2022-06-02 DIAGNOSIS — Z720 Tobacco use: Secondary | ICD-10-CM | POA: Insufficient documentation

## 2022-06-02 DIAGNOSIS — I6523 Occlusion and stenosis of bilateral carotid arteries: Secondary | ICD-10-CM | POA: Insufficient documentation

## 2022-06-02 NOTE — Progress Notes (Signed)
06/02/2022 [name]   Nov 03, 1940  [id number]  Primary Physician Lemmie Evens, MD Primary Cardiologist: Lorretta Harp MD FACP, [place], [place], [place]  HPI:  [name] is a 81 y.o.  mildly-overweight married Caucasian female, mother of 2 and grandmother of 3 grandchildren, whom I last saw in the office   02/11/2021.  She is accompanied by her husband today.. She has a history of moderate left internal carotid artery stenosis which we have been following by duplex ultrasound on an annual basis. She continues to smoke 10-15 cigarettes a day despite counseling to the contrary.  Her other problems include hypertension and hyperlipidemia. She denies chest pain or shortness of breath. She had a Myoview performed November 27, 2008, which was nonischemic. Dr. Karie Kirks follows her lipid profile.    Since I saw her a year ago she is remained stable.  She denies chest pain or shortness of breath.  Unfortunately she still smokes 5 to 6 cigarettes a day.  Her last carotid Doppler study performed in August of last year did show moderate bilateral ICA stenosis.   Current Meds  Medication Sig   acetaminophen (TYLENOL) 500 MG tablet Take 1,000 mg by mouth every 6 (six) hours as needed.   acyclovir (ZOVIRAX) 200 MG capsule Take 200 mg by mouth 5 (five) times daily as needed (fever blisters).   albuterol (PROVENTIL HFA;VENTOLIN HFA) 108 (90 BASE) MCG/ACT inhaler Inhale 2 puffs into the lungs every 6 (six) hours as needed for wheezing or shortness of breath.   alendronate (FOSAMAX) 70 MG tablet Take 70 mg by mouth once a week.   ALPRAZolam (XANAX) 0.5 MG tablet Take 0.25 mg by mouth daily as needed for anxiety or sleep.    Ascorbic Acid (VITAMIN C) 1000 MG tablet Take 1,000 mg by mouth daily.   aspirin EC 81 MG tablet Take 81 mg by mouth 2 (two) times a week. Wednesday and sundays   atorvastatin (LIPITOR) 40 MG tablet Take 40 mg by mouth daily at 6 PM.    benazepril (LOTENSIN) 10 MG tablet Take 10 mg by  mouth at bedtime.    budesonide (ENTOCORT EC) 3 MG 24 hr capsule Take 3 capsules (9 mg total) by mouth daily. (Patient taking differently: Take 3 mg by mouth daily. Pt takes one every other day.)   cholecalciferol (VITAMIN D3) 25 MCG (1000 UNIT) tablet Take 1,000 Units by mouth daily.   hydrochlorothiazide (MICROZIDE) 12.5 MG capsule Take 12.5 mg by mouth daily.    HYDROcodone-acetaminophen (NORCO/VICODIN) 5-325 MG tablet One tablet every four hours as needed for acute pain.  Limit of five days per [place] statue. (Patient taking differently: One tablet every four hours as needed for acute pain.  Limit of five days per  statue.)   levothyroxine (SYNTHROID, LEVOTHROID) 88 MCG tablet Take 88 mcg by mouth daily before breakfast.   loperamide (IMODIUM) 2 MG capsule Take 2-4 mg by mouth as needed for diarrhea or loose stools.   MAGNESIUM PO Take 1 tablet by mouth daily.   MELATONIN PO Take 3 mg by mouth at bedtime.   omeprazole (PRILOSEC) 20 MG capsule Take 20 mg by mouth 2 (two) times a week. Wednesday and Sundays   UNABLE TO FIND Med Name: hair, skin, and nail supplement.     Allergies  Allergen Reactions   Codeine Nausea And Vomiting    Social History   Socioeconomic History   Marital status: Married    Spouse name:  Not on file   Number of children: Not on file   Years of education: Not on file   Highest education level: Not on file  Occupational History   Not on file  Tobacco Use   Smoking status: Every Day    Packs/day: 0.25    Years: 55.00    Total pack years: 13.75    Types: Cigarettes   Smokeless tobacco: Never   Tobacco comments:    5-6 cigs/day  Vaping Use   Vaping Use: Never used  Substance and Sexual Activity   Alcohol use: No   Drug use: No   Sexual activity: Yes    Birth control/protection: Surgical  Other Topics Concern   Not on file  Social History Narrative   Not on file   Social Determinants of Health   Financial Resource Strain: Not on file   Food Insecurity: Not on file  Transportation Needs: Not on file  Physical Activity: Not on file  Stress: Not on file  Social Connections: Not on file  Intimate Partner Violence: Not on file     Review of Systems: General: negative for chills, fever, night sweats or weight changes.  Cardiovascular: negative for chest pain, dyspnea on exertion, edema, orthopnea, palpitations, paroxysmal nocturnal dyspnea or shortness of breath Dermatological: negative for rash Respiratory: negative for cough or wheezing Urologic: negative for hematuria Abdominal: negative for nausea, vomiting, diarrhea, bright red blood per rectum, melena, or hematemesis Neurologic: negative for visual changes, syncope, or dizziness All other systems reviewed and are otherwise negative except as noted above.    Blood pressure (!) 144/60, pulse 74, height '5\' 5"'$  (1.651 m), weight 142 lb 9.6 oz (64.7 kg), SpO2 94 %.  General appearance: alert and no distress Neck: no adenopathy, no JVD, supple, symmetrical, trachea midline, thyroid not enlarged, symmetric, no tenderness/mass/nodules, and right carotid bruit Lungs: clear to auscultation bilaterally Heart: regular rate and rhythm, S1, S2 normal, no murmur, click, rub or gallop Extremities: extremities normal, atraumatic, no cyanosis or edema Pulses: 2+ and symmetric Skin: Skin color, texture, turgor normal. No rashes or lesions Neurologic: Grossly normal  EKG not performed today  ASSESSMENT AND PLAN:   Essential hypertension History of essential hypertension a blood pressure measured today at 144/60.  She is on hydrochlorothiazide and benazepril.  Hyperlipidemia History of hyperlipidemia on statin therapy with lipid profile performed 12/23/20 revealing total cholesterol 155, LDL of 69 and HDL of 63.  Tobacco abuse Ongoing tobacco abuse of 46 cigarettes a day recalcitrant to respect modification.  Carotid artery disease ([place]) History of carotid disease with  Dopplers performed 05/05/2022 revealing moderate bilateral ICA stenosis.     Lorretta Harp MD FACP,FACC,FAHA, [facility] 06/02/2022 3:22 PM

## 2022-06-02 NOTE — Assessment & Plan Note (Signed)
History of essential hypertension a blood pressure measured today at 144/60.  She is on hydrochlorothiazide and benazepril.

## 2022-06-02 NOTE — Assessment & Plan Note (Signed)
History of hyperlipidemia on statin therapy with lipid profile performed 12/23/20 revealing total cholesterol 155, LDL of 69 and HDL of 63.

## 2022-06-02 NOTE — Patient Instructions (Signed)
Medication Instructions:  Your physician recommends that you continue on your current medications as directed. Please refer to the Current Medication list given to you today.  *If you need a refill on your cardiac medications before your next appointment, please call your pharmacy*   Testing/Procedures: Your physician has requested that you have a carotid duplex. This test is an ultrasound of the carotid arteries in your neck. It looks at blood flow through these arteries that supply the brain with blood. Allow one hour for this exam. There are no restrictions or special instructions. To be done in August 2024. This procedure will be done at [address]   Follow-Up: At [facility], you and your health needs are our priority.  As part of our continuing mission to provide you with exceptional heart care, we have created designated Provider Care Teams.  These Care Teams include your primary Cardiologist (physician) and Advanced Practice Providers (APPs -  Physician Assistants and Nurse Practitioners) who all work together to provide you with the care you need, when you need it.  We recommend signing up for the patient portal called "MyChart".  Sign up information is provided on this After Visit Summary.  MyChart is used to connect with patients for Virtual Visits (Telemedicine).  Patients are able to view lab/test results, encounter notes, upcoming appointments, etc.  Non-urgent messages can be sent to your provider as well.   To learn more about what you can do with MyChart, go to [email or web address].    Your next appointment:   12 month(s)  The format for your next appointment:   In Person  Provider:   Jonathan Berry, MD  

## 2022-06-02 NOTE — Assessment & Plan Note (Signed)
History of carotid disease with Dopplers performed 05/05/2022 revealing moderate bilateral ICA stenosis.

## 2022-06-02 NOTE — Assessment & Plan Note (Signed)
Ongoing tobacco abuse of 46 cigarettes a day recalcitrant to respect modification.

## 2022-06-21 NOTE — Progress Notes (Unsigned)
Referring Provider: Lemmie Evens, MD Primary Care Physician:  Lemmie Evens, MD Primary GI Physician: Dr. Gala Romney  No chief complaint on file.   HPI:   [name] is a 81 y.o. female presenting today for follow-up of collagenous colitis.  Collagenous colitis was diagnosed in May 2021.  Prior stool studies including C. difficile and GI pathogen panel as well as celiac serologies and TSH were within normal limits.  She has been treated with budesonide and we have tapered off budesonide a few times, but after a few months, she has return of diarrhea with no improvement with imodium, requiring recurrent budesonide prescriptions, now maintaining on lowest effective dose of budesonide since February 2023.  Prior to collagenous colitis diagnosis, we tried several medications including Imodium, Colestid, dicyclomine, none of which improved her diarrhea.  Last seen in our office 03/11/2022.  She is doing very well at that time without diarrhea.  Had a single episode of diarrhea the week prior that resolved with Imodium.  Otherwise, she was taking budesonide 3 mg every other day which she started about 2 weeks ago.  No other significant GI symptoms.  Recommended continuing budesonide every other day.  For up-to-date, noted that it was recommended to continue lowest effective dose for 6-12 months for patient to have relapsing symptoms.  I did note that she had history of osteopenia with last bone density scan in 2019.  Recommended updating DEXA, continuing vitamin D supplementation and adding calcium supplementation.  Plan to follow-up in 3 months.  DEXA completed 03/15/2022 with a T score of -2.6, consistent with osteoporosis.  Recommended follow-up with PCP.  Today:    Past Medical History:  Diagnosis Date   Arthritis    Carotid artery disease ([place])    Collagenous colitis 01/2020   COPD (chronic obstructive pulmonary disease) ([facility])    Dysrhythmia    palpitations   GERD (gastroesophageal  reflux disease)    Hypercholesteremia    Hypertension    Hypothyroidism    Shortness of breath     Past Surgical History:  Procedure Laterality Date   ABDOMINAL HYSTERECTOMY     APPENDECTOMY     BACK SURGERY  2019   BIOPSY  01/23/2020   Procedure: BIOPSY;  Surgeon: Daneil Dolin, MD;  Location: [facility];  Service: Endoscopy;;  ascending, desending colon, sigmoid   BREAST BIOPSY Left 05/04/2017   Procedure: BREAST BIOPSY WITH NEEDLE LOCALIZATION;  Surgeon: Aviva Signs, MD;  Location: [facility];  Service: General;  Laterality: Left;  needle loc at 8:00   CATARACT EXTRACTION W/PHACO Left 09/30/2014   Procedure: CATARACT EXTRACTION PHACO AND INTRAOCULAR LENS PLACEMENT LEFT EYE;  Surgeon: Tonny Branch, MD;  Location: [facility];  Service: Ophthalmology;  Laterality: Left;  CDE:8.79   CATARACT EXTRACTION W/PHACO Right 10/17/2014   Procedure: CATARACT EXTRACTION PHACO AND INTRAOCULAR LENS PLACEMENT RIGHT EYE;  Surgeon: Tonny Branch, MD;  Location: [facility];  Service: Ophthalmology;  Laterality: Right;  CDE: 7.80   CHOLECYSTECTOMY N/A 04/30/2013   Procedure: Attempted LAPAROSCOPIC CHOLECYSTECTOMY ; converted to open procedure @ 1510;  Surgeon: Scherry Ran, MD;  Location: [facility];  Service: General;  Laterality: N/A;   CHOLECYSTECTOMY N/A 04/30/2013   Procedure: CHOLECYSTECTOMY;  Surgeon: Scherry Ran, MD;  Location: [facility];  Service: General;  Laterality: N/A;  procedure started @ 1520   COLONOSCOPY  06/11/2004   External hemorrhoids, few scattered left-sided diverticula, otherwise normal.  Repeat colonoscopy in 10 years.   COLONOSCOPY N/A  01/23/2020   Procedure: COLONOSCOPY;  Surgeon: Daneil Dolin, MD;  Multiple small largemouth diverticula in the sigmoid and descending colon, otherwise normal exam s/p segmental biopsies of right and left colon for histologic study.  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   ESOPHAGOGASTRODUODENOSCOPY  06/11/2004   Distal esophageal  erosions consistent with mild erosive reflux esophagitis, heaped up adenomatous appearing prepyloric antral mucosa biopsy, single antral prepyloric ulcer biopsied, normal D1 and D2.   ESOPHAGOGASTRODUODENOSCOPY  10/07/2004   Distal esophageal erosions consistent with erosive reflux esophagitis, otherwise normal esophagus, normal stomach, previously noted ulcer healed completely, normal D1 and D2.   LAMINECTOMY  12/26/2017   spine surgery Dr Carloyn Manner   TUBAL LIGATION      Current Outpatient Medications  Medication Sig Dispense Refill   acetaminophen (TYLENOL) 500 MG tablet Take 1,000 mg by mouth every 6 (six) hours as needed.     acyclovir (ZOVIRAX) 200 MG capsule Take 200 mg by mouth 5 (five) times daily as needed (fever blisters).     albuterol (PROVENTIL HFA;VENTOLIN HFA) 108 (90 BASE) MCG/ACT inhaler Inhale 2 puffs into the lungs every 6 (six) hours as needed for wheezing or shortness of breath.     alendronate (FOSAMAX) 70 MG tablet Take 70 mg by mouth once a week.     ALPRAZolam (XANAX) 0.5 MG tablet Take 0.25 mg by mouth daily as needed for anxiety or sleep.      Ascorbic Acid (VITAMIN C) 1000 MG tablet Take 1,000 mg by mouth daily.     aspirin EC 81 MG tablet Take 81 mg by mouth 2 (two) times a week. Wednesday and sundays     atorvastatin (LIPITOR) 40 MG tablet Take 40 mg by mouth daily at 6 PM.      benazepril (LOTENSIN) 10 MG tablet Take 10 mg by mouth at bedtime.      budesonide (ENTOCORT EC) 3 MG 24 hr capsule Take 3 capsules (9 mg total) by mouth daily. (Patient taking differently: Take 3 mg by mouth daily. Pt takes one every other day.) 90 capsule 1   cholecalciferol (VITAMIN D3) 25 MCG (1000 UNIT) tablet Take 1,000 Units by mouth daily.     hydrochlorothiazide (MICROZIDE) 12.5 MG capsule Take 12.5 mg by mouth daily.      HYDROcodone-acetaminophen (NORCO/VICODIN) 5-325 MG tablet One tablet every four hours as needed for acute pain.  Limit of five days per [place]. (Patient  taking differently: One tablet every four hours as needed for acute pain.  Limit of five days per [place].) 30 tablet 0   levothyroxine (SYNTHROID, LEVOTHROID) 88 MCG tablet Take 88 mcg by mouth daily before breakfast.     loperamide (IMODIUM) 2 MG capsule Take 2-4 mg by mouth as needed for diarrhea or loose stools.     MAGNESIUM PO Take 1 tablet by mouth daily.     MELATONIN PO Take 3 mg by mouth at bedtime.     omeprazole (PRILOSEC) 20 MG capsule Take 20 mg by mouth 2 (two) times a week. Wednesday and Sundays     UNABLE TO FIND Med Name: hair, skin, and nail supplement.     No current facility-administered medications for this visit.   Facility-Administered Medications Ordered in Other Visits  Medication Dose Route Frequency Provider Last Rate Last Admin   fentaNYL (SUBLIMAZE) injection 25-50 mcg  25-50 mcg Intravenous Q5 min PRN Lerry Liner, MD        Allergies as of 06/23/2022 -  Review Complete 06/02/2022  Allergen Reaction Noted   Codeine Nausea And Vomiting 04/26/2013    Family History  Problem Relation Age of Onset   Heart failure Mother    Cancer Father    Colon cancer Neg Hx    Inflammatory bowel disease Neg Hx     Social History   Socioeconomic History   Marital status: Married    Spouse name: Not on file   Number of children: Not on file   Years of education: Not on file   Highest education level: Not on file  Occupational History   Not on file  Tobacco Use   Smoking status: Every Day    Packs/day: 0.25    Years: 55.00    Total pack years: 13.75    Types: Cigarettes   Smokeless tobacco: Never   Tobacco comments:    5-6 cigs/day  Vaping Use   Vaping Use: Never used  Substance and Sexual Activity   Alcohol use: No   Drug use: No   Sexual activity: Yes    Birth control/protection: Surgical  Other Topics Concern   Not on file  Social History Narrative   Not on file   Social Determinants of Health   Financial Resource Strain: Not on file   Food Insecurity: Not on file  Transportation Needs: Not on file  Physical Activity: Not on file  Stress: Not on file  Social Connections: Not on file    Review of Systems: Gen: Denies fever, chills, cold or flulike symptoms, presyncope, syncope. CV: Denies chest pain, palpitations. Resp: Denies dyspnea, cough. GI: See HPI Heme: See HPI  Physical Exam: There were no vitals taken for this visit. General:   Alert and oriented. No distress noted. Pleasant and cooperative.  Head:  Normocephalic and atraumatic. Eyes:  Conjuctiva clear without scleral icterus. Heart:  S1, S2 present without murmurs appreciated. Lungs:  Clear to auscultation bilaterally. No wheezes, rales, or rhonchi. No distress.  Abdomen:  +BS, soft, non-tender and non-distended. No rebound or guarding. No HSM or masses noted. Msk:  Symmetrical without gross deformities. Normal posture. Extremities:  Without edema. Neurologic:  Alert and  oriented x4 Psych:  Normal mood and affect.    Assessment:     Plan:  ***   Aliene Altes, PA-C [facility] 06/23/2022

## 2022-06-23 ENCOUNTER — Encounter: Payer: Self-pay | Admitting: Gastroenterology

## 2022-06-23 ENCOUNTER — Ambulatory Visit (INDEPENDENT_AMBULATORY_CARE_PROVIDER_SITE_OTHER): Payer: Medicare Other | Admitting: Gastroenterology

## 2022-06-23 VITALS — BP 172/64 | HR 82 | Temp 98.0°F | Ht 65.0 in | Wt 140.2 lb

## 2022-06-23 DIAGNOSIS — R143 Flatulence: Secondary | ICD-10-CM

## 2022-06-23 DIAGNOSIS — K219 Gastro-esophageal reflux disease without esophagitis: Secondary | ICD-10-CM | POA: Insufficient documentation

## 2022-06-23 DIAGNOSIS — R634 Abnormal weight loss: Secondary | ICD-10-CM

## 2022-06-23 DIAGNOSIS — K52831 Collagenous colitis: Secondary | ICD-10-CM

## 2022-06-23 NOTE — Patient Instructions (Addendum)
Decrease budesonide to 3 mg every third day and monitor your bowel function. If you have any recurrent diarrhea or if constipation continues, please let me know.  I would like for you to stop omeprazole as you are not having any heartburn symptoms.  If you develop occasional heartburn 1-2 times a week, you can take famotidine 20 mg as needed.  I would also like for you to stop aspirin, but recommend that you discuss this with your primary care doctor to make sure there agreeable with this.  This medication can increase your risk of having a flare of collagenous colitis.  Avoid all other NSAID products including ibuprofen, Aleve, Advil, BC powders, Goody powders, and anything that says "NSAID" on the package.  Work towards smoking cessation as tobacco use also increases your risk of having a collagenous colitis flare.  Try increasing your daily protein intake.  Recommend drinking 2 protein shakes daily and also adding and 2 snacks daily.  Monitor your weight and let me know if you lose an additional 5 pounds.  To help limit flatulence/gas production, limit common gas producing items including broccoli, cabbage, cauliflower, beans, Brussels sprouts, artificial sweeteners, hard candies, chewing gum, drinking through a straw.  You may also try Beano before meals or use Gas-X as needed.  You can also try starting a daily probiotic.  You can purchase this over-the-counter.  It was great to see you again today!  We will plan to follow-up in 3 months or sooner if needed.   Aliene Altes, PA-C [facility]

## 2022-07-02 ENCOUNTER — Other Ambulatory Visit (HOSPITAL_COMMUNITY): Payer: Self-pay | Admitting: Family Medicine

## 2022-07-02 DIAGNOSIS — Z1231 Encounter for screening mammogram for malignant neoplasm of breast: Secondary | ICD-10-CM

## 2022-07-14 ENCOUNTER — Ambulatory Visit (HOSPITAL_COMMUNITY)
Admission: RE | Admit: 2022-07-14 | Discharge: 2022-07-14 | Disposition: A | Discharge status: Home / Self Care | Payer: Medicare Other | Source: Ambulatory Visit | Attending: Family Medicine | Admitting: Family Medicine

## 2022-07-14 DIAGNOSIS — Z1231 Encounter for screening mammogram for malignant neoplasm of breast: Secondary | ICD-10-CM | POA: Insufficient documentation

## 2022-08-16 ENCOUNTER — Telehealth: Payer: Self-pay

## 2022-08-16 DIAGNOSIS — K52831 Collagenous colitis: Secondary | ICD-10-CM

## 2022-08-16 NOTE — Telephone Encounter (Signed)
Refill request for budesonide 3 mg qty: 54 from [facility] Pt was last seen by Kristen on 06/23/2022.

## 2022-08-17 ENCOUNTER — Telehealth: Payer: Self-pay | Admitting: *Deleted

## 2022-08-17 MED ORDER — BUDESONIDE 3 MG PO CPEP: 3.0000 mg | ORAL_CAPSULE | ORAL | 1 refills | Status: DC

## 2022-08-17 NOTE — Telephone Encounter (Signed)
FYI routing to you in absence of [place], [place]. Pt called and states she is out of Budesonide '3mg'$  and would like a prescription sent to [facility].

## 2022-08-17 NOTE — Telephone Encounter (Signed)
Completed.

## 2022-08-17 NOTE — Addendum Note (Signed)
Addended by: Annitta Needs on: 08/17/2022 12:23 PM   Modules accepted: Orders

## 2022-08-18 NOTE — Telephone Encounter (Signed)
Noted  

## 2022-08-26 ENCOUNTER — Other Ambulatory Visit: Payer: Self-pay | Admitting: Gastroenterology

## 2022-09-09 ENCOUNTER — Encounter: Payer: Self-pay | Admitting: Neurology

## 2022-09-09 ENCOUNTER — Ambulatory Visit (INDEPENDENT_AMBULATORY_CARE_PROVIDER_SITE_OTHER): Payer: Medicare Other | Admitting: Neurology

## 2022-09-09 VITALS — BP 174/80 | HR 78 | Ht 65.0 in | Wt 142.0 lb

## 2022-09-09 DIAGNOSIS — G6289 Other specified polyneuropathies: Secondary | ICD-10-CM

## 2022-09-09 NOTE — Progress Notes (Signed)
[facility]  PATIENT: [name] DOB: [birth year]  REQUESTING CLINICIAN: Lemmie Evens, MD HISTORY FROM: Patient and family  REASON FOR VISIT: Generalized weakness    HISTORICAL  CHIEF COMPLAINT:  Chief Complaint  Patient presents with   Follow-up    RM 13 with spouse Richard  Pt is well and stable, weakness and gait has improved since last visit     INTERVAL HISTORY 09/09/2022 Patient presents today for follow-up, she is accompanied by her husband.  Since last visit in June, she reports that she is doing much better.  She did complete home physical therapy and also she self discontinued the Xanax and the hydrocodone.  Currently she is only taking the Lyrica.  Patient reports that she feels much better while off those medications, Xanax and hydrocodone.  Her gait is better and her mind is clearer and she is happy that she is off those medications and has also cut down her cigarettes to less than 10/day.  No other complaints.   HISTORY OF PRESENT ILLNESS:  This is a 81 year old woman past medical history of chronic pain, hypertension, hypothyroidism, COPD, GERD and gait instability who is presenting after being seen in the hospital for generalized weakness and fall.  Patient reports on June 12, she woke up was feeling pain and took her typical medication which include hydrocodone, pregabalin and Xanax at the same time.  Hours later she was trying to walk to the floor and her legs felt extremely weak and she fell on the floor.  She reported she could not get up and a husband could not help her.  EMS was called and patient presented to the ED.  In the ED initial work-up including head CT was all negative.  Patient episode was attributed to her combination of pain medication with benzodiazepine.  Daughter reported back in 2018 and in 2019 she had 2 back surgeries and since then she has not had a lot of improvement in her gait and in her chronic pain.  She has not been  back to her normal self.  She does complain of leg pain mostly from the knee down, describing as aching pain, sometimes numbness for which she takes pregabalin and hydrocodone.  She had tried Gabapentin in  the past. The family reports multiple falls, in the bathroom, in the kitchen causing multiple bruises.  Denies any head severe head injury.  She has not done physical therapy lately due to transportation issues.    OTHER MEDICAL CONDITIONS: Chronic pain, Hypertension, COPD, Hypothyroid, GERD, Gait abnormality, history of lumbar surgery    REVIEW OF SYSTEMS: Full 14 system review of systems performed and negative with exception of: as noted in the HPI   ALLERGIES: Allergies  Allergen Reactions   Codeine Nausea And Vomiting    HOME MEDICATIONS: Outpatient Medications Prior to Visit  Medication Sig Dispense Refill   acetaminophen (TYLENOL) 500 MG tablet Take 1,000 mg by mouth every 6 (six) hours as needed.     acyclovir (ZOVIRAX) 200 MG capsule Take 200 mg by mouth 5 (five) times daily as needed (fever blisters).     albuterol (PROVENTIL HFA;VENTOLIN HFA) 108 (90 BASE) MCG/ACT inhaler Inhale 2 puffs into the lungs every 6 (six) hours as needed for wheezing or shortness of breath.     alendronate (FOSAMAX) 70 MG tablet Take 70 mg by mouth once a week.     ALPRAZolam (XANAX) 0.5 MG tablet Take 0.25 mg by mouth daily as needed for anxiety  or sleep.      Ascorbic Acid (VITAMIN C) 1000 MG tablet Take 1,000 mg by mouth daily.     aspirin EC 81 MG tablet Take 81 mg by mouth 2 (two) times a week. Wednesday and sundays     benazepril (LOTENSIN) 10 MG tablet Take 10 mg by mouth at bedtime.      budesonide (ENTOCORT EC) 3 MG 24 hr capsule Take 1 capsule (3 mg total) by mouth every other day. Every other day 60 capsule 1   cholecalciferol (VITAMIN D3) 25 MCG (1000 UNIT) tablet Take 1,000 Units by mouth daily.     DULoxetine (CYMBALTA) 30 MG capsule Take 30 mg by mouth daily.     hydrochlorothiazide  (MICROZIDE) 12.5 MG capsule Take 12.5 mg by mouth daily.      HYDROcodone-acetaminophen (NORCO/VICODIN) 5-325 MG tablet One tablet every four hours as needed for acute pain.  Limit of five days per [place] statue. (Patient taking differently: One tablet every four hours as needed for acute pain.  Limit of five days per [employer] statue.) 30 tablet 0   levothyroxine (SYNTHROID, LEVOTHROID) 88 MCG tablet Take 88 mcg by mouth daily before breakfast.     loperamide (IMODIUM) 2 MG capsule Take 2-4 mg by mouth as needed for diarrhea or loose stools.     MAGNESIUM PO Take 1 tablet by mouth daily.     MELATONIN PO Take 3 mg by mouth at bedtime.     Multiple Vitamins-Minerals (PRESERVISION AREDS 2) CAPS Take by mouth.     UNABLE TO FIND Med Name: hair, skin, and nail supplement.     atorvastatin (LIPITOR) 40 MG tablet Take 40 mg by mouth daily at 6 PM.  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   COPD (chronic obstructive pulmonary disease) ([facility])    Dysrhythmia    palpitations   GERD (gastroesophageal reflux disease)    Hypercholesteremia    Hypertension    Hypothyroidism    Shortness of breath     PAST SURGICAL HISTORY: Past Surgical History:  Procedure Laterality Date   ABDOMINAL HYSTERECTOMY     APPENDECTOMY     BACK SURGERY  2019   BIOPSY  01/23/2020   Procedure: BIOPSY;  Surgeon: Daneil Dolin, MD;  Location: [facility];  Service: Endoscopy;;  ascending, desending colon, sigmoid   BREAST BIOPSY Left 05/04/2017   Procedure: BREAST BIOPSY WITH NEEDLE LOCALIZATION;  Surgeon: Aviva Signs, MD;  Location: [facility];  Service: General;   Laterality: Left;  needle loc at 8:00   CATARACT EXTRACTION W/PHACO Left 09/30/2014   Procedure: CATARACT EXTRACTION PHACO AND INTRAOCULAR LENS PLACEMENT LEFT EYE;  Surgeon: Tonny Branch, MD;  Location: [facility];  Service: Ophthalmology;  Laterality: Left;  CDE:8.79   CATARACT EXTRACTION W/PHACO Right 10/17/2014   Procedure: CATARACT EXTRACTION PHACO AND INTRAOCULAR LENS PLACEMENT RIGHT EYE;  Surgeon: Tonny Branch, MD;  Location: [facility];  Service: Ophthalmology;  Laterality: Right;  CDE: 7.80   CHOLECYSTECTOMY N/A 04/30/2013   Procedure: Attempted LAPAROSCOPIC CHOLECYSTECTOMY ; converted to open procedure @ [facility];  Surgeon: Grace Bushy  Romona Curls, MD;  Location: [facility];  Service: General;  Laterality: N/A;   CHOLECYSTECTOMY N/A 04/30/2013   Procedure: CHOLECYSTECTOMY;  Surgeon: Scherry Ran, MD;  Location: [facility];  Service: General;  Laterality: N/A;  procedure started @ 1520   COLONOSCOPY  06/11/2004   External hemorrhoids, few scattered left-sided diverticula, otherwise normal.  Repeat colonoscopy in 10 years.   COLONOSCOPY N/A 01/23/2020   Procedure: COLONOSCOPY;  Surgeon: Daneil Dolin, MD;  Multiple small [place] diverticula in the sigmoid and descending colon, otherwise normal exam s/p segmental biopsies of right and left colon for histologic study.  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   ESOPHAGOGASTRODUODENOSCOPY  06/11/2004   Distal esophageal erosions consistent with mild erosive reflux esophagitis, heaped up adenomatous appearing prepyloric antral mucosa biopsy, single antral prepyloric ulcer biopsied, normal D1 and D2.   ESOPHAGOGASTRODUODENOSCOPY  10/07/2004   Distal esophageal erosions consistent with erosive reflux esophagitis, otherwise normal esophagus, normal stomach, previously noted ulcer healed completely, normal D1 and D2.   LAMINECTOMY  12/26/2017   spine surgery Dr Carloyn Manner   TUBAL LIGATION      FAMILY HISTORY: Family History  Problem Relation Age of Onset    Heart failure Mother    Cancer Father    Colon cancer Neg Hx    Inflammatory bowel disease Neg Hx     SOCIAL HISTORY: Social History   Socioeconomic History   Marital status: Married    Spouse name: Not on file   Number of children: Not on file   Years of education: Not on file   Highest education level: Not on file  Occupational History   Not on file  Tobacco Use   Smoking status: Every Day    Packs/day: 0.25    Years: 55.00    Total pack years: 13.75    Types: Cigarettes   Smokeless tobacco: Never   Tobacco comments:    5-6 cigs/day  Vaping Use   Vaping Use: Never used  Substance and Sexual Activity   Alcohol use: No   Drug use: No   Sexual activity: Yes    Birth control/protection: Surgical  Other Topics Concern   Not on file  Social History Narrative   Not on file   Social Determinants of Health   Financial Resource Strain: Not on file  Food Insecurity: Not on file  Transportation Needs: Not on file  Physical Activity: Not on file  Stress: Not on file  Social Connections: Not on file  Intimate Partner Violence: Not on file    PHYSICAL EXAM  GENERAL EXAM/CONSTITUTIONAL: Vitals:  Vitals:   09/09/22 1431  BP: (!) 174/80  Pulse: 78  Weight: 142 lb (64.4 kg)  Height: '5\' 5"'$  (1.651 m)   Body mass index is 23.63 kg/m. Wt Readings from Last 3 Encounters:  09/09/22 142 lb (64.4 kg)  06/23/22 140 lb 3.2 oz (63.6 kg)  06/02/22 142 lb 9.6 oz (64.7 kg)   Patient is in no distress; well developed, nourished and groomed; neck is supple  EYES: Visual fields full to confrontation, Extraocular movements intacts,   MUSCULOSKELETAL: Gait, strength, tone, movements noted in Neurologic exam below  NEUROLOGIC: MENTAL STATUS:      No data to display         awake, alert, oriented to person, place and time recent and remote memory intact normal attention and concentration language fluent, comprehension intact, naming intact fund of knowledge  appropriate  CRANIAL NERVE:  2nd, 3rd, 4th, 6th - visual  fields full to confrontation, extraocular muscles intact, no nystagmus 5th - facial sensation symmetric 7th - facial strength symmetric 8th - hearing intact 9th - palate elevates symmetrically, uvula midline 11th - shoulder shrug symmetric 12th - tongue protrusion midline  MOTOR:  normal bulk and tone, full strength in the BUE, BLE  SENSORY:  normal and symmetric to light touch  COORDINATION:  finger-nose-finger, fine finger movements normal  REFLEXES:  deep tendon reflexes present and symmetric, 2+ in the BUE and 1+ knees bilaterally   GAIT/STATION:  Able to stand unassisted and her gait is normal. (Previous gait exam: Uses a wheelchair but able to stand and walk unassisted. Gait is hesitant and wide base)   DIAGNOSTIC DATA (LABS, IMAGING, TESTING) - I reviewed patient records, labs, notes, testing and imaging myself where available.  Lab Results  Component Value Date   WBC 5.9 03/01/2022   HGB 10.6 (L) 03/01/2022   HCT 33.6 (L) 03/01/2022   MCV 88.9 03/01/2022   PLT 194 03/01/2022      Component Value Date/Time   NA 134 (L) 03/01/2022 2119   NA 138 06/13/2020 1209   K 4.9 03/01/2022 2119   CL 105 03/01/2022 2119   CO2 25 03/01/2022 2119   GLUCOSE 142 (H) 03/01/2022 2119   BUN 26 (H) 03/01/2022 2119   BUN 19 06/13/2020 1209   CREATININE 1.21 (H) 03/01/2022 2119   CREATININE 1.06 (H) 01/18/2020 1215   CALCIUM 8.4 (L) 03/01/2022 2119   PROT 6.2 (L) 03/01/2022 2119   PROT 7.1 12/23/2020 0937   ALBUMIN 3.6 03/01/2022 2119   ALBUMIN 4.8 (H) 12/23/2020 0937   AST 31 03/01/2022 2119   ALT 24 03/01/2022 2119   ALKPHOS 98 03/01/2022 2119   BILITOT 0.6 03/01/2022 2119   BILITOT 0.4 12/23/2020 0937   GFRNONAA 45 (L) 03/01/2022 2119   GFRNONAA 41 (L) 12/03/2019 1308   GFRAA 60 06/13/2020 1209   GFRAA 47 (L) 12/03/2019 1308   Lab Results  Component Value Date   CHOL 155 12/23/2020   HDL 63 12/23/2020    LDLCALC 69 12/23/2020   TRIG 130 12/23/2020   CHOLHDL 2.5 12/23/2020   No results found for: "HGBA1C" Lab Results  Component Value Date   VITAMINB12 1,119 03/10/2022   Lab Results  Component Value Date   TSH 2.360 03/10/2022    Head CT 03/01/22 1. No acute intracranial pathology. 2. Mild age-related atrophy and chronic microvascular ischemic changes.    ASSESSMENT AND PLAN  81 y.o. year old female with past medical history including chronic pain, lumbar surgery, peripheral neuropathy, hypertension, hypothyroidism, vitamin B12 deficiency who is presenting for follow-up for generalized weakness, gait abnormality and polyneuropathy.  She reports that she is feeling much better after completion of physical therapy and discontinuing the Xanax and the hydrocodone.  Today on exam she was able to stand unassisted and had a normal gait.  Overall she is doing very well.  I have congratulated her on discontinuing the Xanax and Hydrocodone again, congratulated her on decreasing her cigarette smoking and have encouraged her to stop smoking.  Advised her to continue to follow with her PCP and I will see her as needed.    1. Other polyneuropathy      Patient Instructions  Continue to follow with PCP Continue with physical activity Return as needed  No orders of the defined types were placed in this encounter.   No orders of the defined types were placed in this encounter.  Return if symptoms worsen or fail to improve.  I have spent a total of 65 minutes dedicated to this patient today, preparing to see patient, performing a medically appropriate examination and evaluation, ordering tests and/or medications and procedures, and counseling and educating the patient/family/caregiver; independently interpreting result and communicating results to the family/patient/caregiver; and documenting clinical information in the electronic medical record.   Alric Ran, MD 09/09/2022, 2:58  PM  [facility] [address] [place], [place] [address] [phone]

## 2022-09-09 NOTE — Patient Instructions (Signed)
Continue to follow with PCP Continue with physical activity Return as needed

## 2022-09-23 ENCOUNTER — Encounter: Payer: Self-pay | Admitting: Gastroenterology

## 2022-09-23 ENCOUNTER — Ambulatory Visit (INDEPENDENT_AMBULATORY_CARE_PROVIDER_SITE_OTHER): Payer: Medicare Other | Admitting: Gastroenterology

## 2022-09-23 VITALS — BP 139/68 | HR 75 | Ht 65.0 in | Wt 146.2 lb

## 2022-09-23 DIAGNOSIS — Z79899 Other long term (current) drug therapy: Secondary | ICD-10-CM | POA: Diagnosis not present

## 2022-09-23 DIAGNOSIS — K219 Gastro-esophageal reflux disease without esophagitis: Secondary | ICD-10-CM | POA: Diagnosis not present

## 2022-09-23 DIAGNOSIS — K52831 Collagenous colitis: Secondary | ICD-10-CM

## 2022-09-23 DIAGNOSIS — R634 Abnormal weight loss: Secondary | ICD-10-CM

## 2022-09-23 NOTE — Patient Instructions (Addendum)
Decrease budesonide to 3 mg every third day and monitor bowel function.  Do this for 1 month then stop altogether.  Please let us know if you have any recurrent diarrhea.   Avoid all NSAID products (ibuprofen, Aleve, Advil, BC or Goody powders) as well as aspirin and anything else this is NSAID on the package.  If you begin to have any reflux type symptoms you may use famotidine as needed.  Continue to work toward smoking cessation.  Continue daily probiotic.  Will plan to see you in 4 to 5 months, or sooner if needed if you have any recurrent diarrhea.  Happy new year!  It was a pleasure to see you today. I want to create trusting relationships with patients. If you receive a survey regarding your visit,  I greatly appreciate you taking time to fill this out on paper or through your MyChart. I value your feedback.  Venetia Night, MSN, FNP-BC, AGACNP-BC [facility]

## 2022-09-23 NOTE — Progress Notes (Signed)
GI Office Note    Referring Provider: Lemmie Evens, MD Primary Care Physician:  Lemmie Evens, MD Primary Gastroenterologist: Cristopher Estimable.Rourk, MD  Date:  09/23/2022  ID:  [name], DOB 26-Oct-1940, MRN [id number]   Chief Complaint   Chief Complaint  Patient presents with   Follow-up   History of Present Illness  [name] is a 82 y.o. female with a history of collagenous colitis diagnosed in May 2021 with previous difficulty of tapering off budesonide, CAD, COPD, GERD, HLD, HTN, and hypothyroidism presenting today for follow-up of collagenous colitis.  Previous history of diarrhea.  Treated with Imodium, Colestid, dicyclomine which did not improve her diarrhea.  She was diagnosed with collagenous colitis in May 2021 via colonoscopy.  Prior to diagnosis stool studies negative as well as celiac serologies and TSH within normal limits.  She was initially treated with budesonide taper off however diarrhea returned after a few months.  Has continued on budesonide at lowest effective dose consistently since February 2023.  Previously taking budesonide 3 mg every other day.  DEXA scan completed in June 2023 with T-score of -2.6, consistent with osteoporosis.  She is to follow with her PCP regarding this.  Last office visit 06/23/22.  Reportedly having a little bit of constipation, good productive bowel movement for couple days no skip a day or so.  Denied melena, BRBPR, or abdominal pain.  Taking aspirin for heart palpitations, twice weekly for a long time.  Denied chest pain, shortness of breath, or intent of stroke.  Taking omeprazole twice weekly without significant heartburn symptoms.  Noted to have 6 pound weight loss over 4 months but reported good appetite.  No other alarm symptoms.  He reports some bloating/flatulence in the afternoons.  Avoiding dairy.  Advised to decrease budesonide to 3 mg every third day.  Notify for any recurrent diarrhea.  Advised to stop omeprazole together  monitor for heartburn symptoms.  May use famotidine 20 mg as needed.  Recommended stopping aspirin please to be agreeable and avoid all other NSAIDs.  Smoking cessation recommended.  Weight loss is recommended to drink 2 protein shakes daily and 2 snacks in addition to her regular diet.  May try Beano before meals or Gas-X as needed or daily probiotic.  Advised to follow-up in 3 months or sooner if needed.  Recommended attempt at discontinuing budesonide.  Today: Collagenous colitis/diarrhea: Found out her cholesterol medication was causing her diarrhea. Also was having leg pain and was taken off her zetia. Has been taking budesonide every other day. Had went to every 3 days but given the diarrhea she started back to every other day but had diarrhea secondary to her cholesterol medication. No issues since stopping her cholesterol medication. Denies constipation, diarrhea, nausea, vomiting, melena, brbpr. Denies bloating or gassiness.   Stopped taking aspirin.   GERD: None.  Eats peanut butter crackers and bananas for breakfast. Eats that almost every morning.   Wt Readings from Last 3 Encounters:  09/23/22 146 lb 3.2 oz (66.3 kg)  09/09/22 142 lb (64.4 kg)  06/23/22 140 lb 3.2 oz (63.6 kg)     Current Outpatient Medications  Medication Sig Dispense Refill   acetaminophen (TYLENOL) 500 MG tablet Take 1,000 mg by mouth every 6 (six) hours as needed.     albuterol (PROVENTIL HFA;VENTOLIN HFA) 108 (90 BASE) MCG/ACT inhaler Inhale 2 puffs into the lungs every 6 (six) hours as needed for wheezing or shortness of breath.     alendronate (FOSAMAX)  70 MG tablet Take 70 mg by mouth once a week.     Ascorbic Acid (VITAMIN C) 1000 MG tablet Take 1,000 mg by mouth daily.     benazepril (LOTENSIN) 10 MG tablet Take 10 mg by mouth at bedtime.      budesonide (ENTOCORT EC) 3 MG 24 hr capsule Take 1 capsule (3 mg total) by mouth every other day. Every other day 60 capsule 1   cholecalciferol (VITAMIN D3) 25  MCG (1000 UNIT) tablet Take 1,000 Units by mouth daily.     DULoxetine (CYMBALTA) 30 MG capsule Take 30 mg by mouth daily.     hydrochlorothiazide (MICROZIDE) 12.5 MG capsule Take 12.5 mg by mouth daily.      levothyroxine (SYNTHROID, LEVOTHROID) 88 MCG tablet Take 88 mcg by mouth daily before breakfast.     MAGNESIUM PO Take 1 tablet by mouth daily.     MELATONIN PO Take 3 mg by mouth at bedtime.     Multiple Vitamins-Minerals (PRESERVISION AREDS 2) CAPS Take by mouth.     pregabalin (LYRICA) 50 MG capsule Take 50 mg by mouth 3 (three) times daily.     UNABLE TO FIND Med Name: hair, skin, and nail supplement.     ALPRAZolam (XANAX) 0.5 MG tablet Take 0.25 mg by mouth daily as needed for anxiety or sleep.  (Patient not taking: Reported on 09/23/2022)     loperamide (IMODIUM) 2 MG capsule Take 2-4 mg by mouth as needed for diarrhea or loose stools. (Patient not taking: Reported on 09/23/2022)     No current facility-administered medications for this visit.   Facility-Administered Medications Ordered in Other Visits  Medication Dose Route Frequency Provider Last Rate Last Admin   fentaNYL (SUBLIMAZE) injection 25-50 mcg  25-50 mcg Intravenous Q5 min PRN Lerry Liner, MD        Past Medical History:  Diagnosis Date   Arthritis    Carotid artery disease ([place])    Collagenous colitis 01/2020   COPD (chronic obstructive pulmonary disease) ([facility])    Dysrhythmia    palpitations   GERD (gastroesophageal reflux disease)    Hypercholesteremia    Hypertension    Hypothyroidism    Shortness of breath     Past Surgical History:  Procedure Laterality Date   ABDOMINAL HYSTERECTOMY     APPENDECTOMY     BACK SURGERY  2019   BIOPSY  01/23/2020   Procedure: BIOPSY;  Surgeon: Daneil Dolin, MD;  [facility];  Service: Endoscopy;;  ascending, desending colon, sigmoid   BREAST BIOPSY Left 05/04/2017   Procedure: BREAST BIOPSY WITH NEEDLE LOCALIZATION;  Surgeon: Aviva Signs, MD;  Location:  [facility];  Service: General;  Laterality: Left;  needle loc at 8:00   CATARACT EXTRACTION W/PHACO Left 09/30/2014   Procedure: CATARACT EXTRACTION PHACO AND INTRAOCULAR LENS PLACEMENT LEFT EYE;  Surgeon: Tonny Branch, MD;  Location: [facility];  Service: Ophthalmology;  Laterality: Left;  CDE:8.79   CATARACT EXTRACTION W/PHACO Right 10/17/2014   Procedure: CATARACT EXTRACTION PHACO AND INTRAOCULAR LENS PLACEMENT RIGHT EYE;  Surgeon: Tonny Branch, MD;  Location: [facility];  Service: Ophthalmology;  Laterality: Right;  CDE: 7.80   CHOLECYSTECTOMY N/A 04/30/2013   Procedure: Attempted LAPAROSCOPIC CHOLECYSTECTOMY ; converted to open procedure @ 1510;  Surgeon: Scherry Ran, MD;  Location: [facility];  Service: General;  Laterality: N/A;   CHOLECYSTECTOMY N/A 04/30/2013   Procedure: CHOLECYSTECTOMY;  Surgeon: Scherry Ran, MD;  Location: [facility];  Service: General;  Laterality: N/A;  procedure started @ 1520   COLONOSCOPY  06/11/2004   External hemorrhoids, few scattered left-sided diverticula, otherwise normal.  Repeat colonoscopy in 10 years.   COLONOSCOPY N/A 01/23/2020   Procedure: COLONOSCOPY;  Surgeon: Daneil Dolin, MD;  Multiple small largemouth diverticula in the sigmoid and descending colon, otherwise normal exam s/p segmental biopsies of right and left colon for histologic study.  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   ESOPHAGOGASTRODUODENOSCOPY  06/11/2004   Distal esophageal erosions consistent with mild erosive reflux esophagitis, heaped up adenomatous appearing prepyloric antral mucosa biopsy, single antral prepyloric ulcer biopsied, normal D1 and D2.   ESOPHAGOGASTRODUODENOSCOPY  10/07/2004   Distal esophageal erosions consistent with erosive reflux esophagitis, otherwise normal esophagus, normal stomach, previously noted ulcer healed completely, normal D1 and D2.   LAMINECTOMY  12/26/2017   spine surgery Dr Carloyn Manner   TUBAL LIGATION      Family History  Problem Relation  Age of Onset   Heart failure Mother    Cancer Father    Colon cancer Neg Hx    Inflammatory bowel disease Neg Hx     Allergies as of 09/23/2022 - Review Complete 09/23/2022  Allergen Reaction Noted   Codeine Nausea And Vomiting 04/26/2013    Social History   Socioeconomic History   Marital status: Married    Spouse name: Not on file   Number of children: Not on file   Years of education: Not on file   Highest education level: Not on file  Occupational History   Not on file  Tobacco Use   Smoking status: Every Day    Packs/day: 0.25    Years: 55.00    Total pack years: 13.75    Types: Cigarettes   Smokeless tobacco: Never   Tobacco comments:    5-6 cigs/day  Vaping Use   Vaping Use: Never used  Substance and Sexual Activity   Alcohol use: No   Drug use: No   Sexual activity: Yes    Birth control/protection: Surgical  Other Topics Concern   Not on file  Social History Narrative   Not on file   Social Determinants of Health   Financial Resource Strain: Not on file  Food Insecurity: Not on file  Transportation Needs: Not on file  Physical Activity: Not on file  Stress: Not on file  Social Connections: Not on file     Review of Systems   Gen: Denies fever, chills, anorexia. Denies fatigue, weakness, weight loss.  CV: Denies chest pain, palpitations, syncope, peripheral edema, and claudication. Resp: Denies dyspnea at rest, cough, wheezing, coughing up blood, and pleurisy. GI: See HPI Derm: Denies rash, itching, dry skin Psych: Denies depression, anxiety, memory loss, confusion. No homicidal or suicidal ideation.  Heme: Denies bruising, bleeding, and enlarged lymph nodes.   Physical Exam   BP 139/68   Pulse 75   Ht '5\' 5"'$  (1.651 m)   Wt 146 lb 3.2 oz (66.3 kg)   SpO2 96%   BMI 24.33 kg/m   General:   Alert and oriented. No distress noted. Pleasant and cooperative.  Head:  Normocephalic and atraumatic. Eyes:  Conjuctiva clear without scleral  icterus. Mouth:  Oral mucosa pink and moist. Good dentition. No lesions. Lungs:  Clear to auscultation bilaterally. No wheezes, rales, or rhonchi. No distress.  Heart:  S1, S2 present without murmurs appreciated.  Abdomen:  +BS, soft, non-tender and non-distended. No rebound or guarding. No HSM or masses noted. Rectal: deferred Msk:  Symmetrical without gross deformities. Normal posture. Extremities:  Without edema. Neurologic:  Alert and  oriented x4 Psych:  Alert and cooperative. Normal mood and affect.   Assessment  Emberlee DONOVAN GATCHEL is a 82 y.o. female with a history of collagenous colitis diagnosed in May 2021 with previous difficulty of tapering off budesonide, CAD, COPD, GERD, HLD, HTN, and hypothyroidism presenting today for follow-up of collagenous colitis.  Collagenous colitis: Diagnosed in May 2021.  Many brief treatments with budesonide with taper in the past however always have return of significant diarrhea.  Symptoms previously not able to be controlled with Imodium.  Has been maintained on low-dose budesonide since February of last year.  Plan was to decrease budesonide to every third day at last visit in October however given some recurrent diarrhea likely secondary to her cholesterol medication she went back to every other day dosing.  Currently without any diarrhea or constipation as previously stated at last visit.  We will plan to go to every third day dosing starting now continue for about 1 month and then attempt at stopping medication and monitoring for recurrence of diarrhea.  GERD: Resolved.  Stopped omeprazole after last visit.  Has been avoiding all NSAIDs including aspirin.  Denies any current issues.  Weight loss: Previously with concern for 6 pound weight loss in 4 months at last visit.  Has a good appetite.  She has actually gained 4 pounds since her last visit according to today's weight.   PLAN   Reduce budesonide to 3 mg every 3 days for 1 month and then stop and  monitor for recurrence.  Continue probiotic daily.  Continue to avoid aspirin and NSAIDs Work toward tobacco cessation. Follow up in 4-5 months or sooner if needed.    Venetia Night, MSN, FNP-BC, AGACNP-BC [facility]

## 2022-11-18 ENCOUNTER — Encounter: Payer: Self-pay | Admitting: Radiology

## 2022-12-09 ENCOUNTER — Telehealth: Payer: Self-pay | Admitting: *Deleted

## 2022-12-09 NOTE — Telephone Encounter (Signed)
Pt called and states she has had diarrhea for the last couple weeks. She states she has been taking up to 4 imodium a day and still having diarrhea.

## 2022-12-10 NOTE — Telephone Encounter (Signed)
Spoke to pt, she informed me that she stopped budesonide in February and she is taking a probiotic. No fever, chills or abdominal pain. She is will to try cholestyramine.

## 2022-12-12 ENCOUNTER — Other Ambulatory Visit: Payer: Self-pay | Admitting: Gastroenterology

## 2022-12-12 DIAGNOSIS — K52831 Collagenous colitis: Secondary | ICD-10-CM

## 2022-12-12 MED ORDER — CHOLESTYRAMINE POWD: 4.0000 g | Freq: Every day | 1 refills | Status: DC

## 2022-12-13 NOTE — Telephone Encounter (Signed)
Spoke to pt, informed her of recommendations. Pt voiced understanding. Informed pt to call in a couple weeks with progress report.

## 2022-12-13 NOTE — Telephone Encounter (Signed)
LMOM for pt to call office  

## 2023-01-06 ENCOUNTER — Encounter: Payer: Self-pay | Admitting: Internal Medicine

## 2023-01-06 ENCOUNTER — Telehealth: Payer: Self-pay | Admitting: *Deleted

## 2023-01-06 NOTE — Telephone Encounter (Signed)
Pt called and states she is still having diarrhea every now and than. It is better than it was.

## 2023-01-10 NOTE — Telephone Encounter (Signed)
Noted. Informed pt.  

## 2023-04-24 NOTE — Progress Notes (Unsigned)
Referring Provider: Gareth Morgan, MD Primary Care Physician:  Gareth Morgan, MD Primary GI Physician: Dr. Jena Gauss  Chief Complaint  Patient presents with   Follow-up    Follow up. Still having diarrhea     HPI:   [name] is a 82 y.o. female presenting today for follow-up of collagenous colitis with chief complaint of recurrent diarrhea.   Collagenous colitis was diagnosed in May 2021.  Prior stool studies including C. difficile and GI pathogen panel as well as celiac serologies and TSH were within normal limits.  She has been treated with budesonide and we have tapered off budesonide a few times, but after a few months, she has return of diarrhea with no improvement with imodium, requiring recurrent budesonide prescriptions, now maintaining on lowest effective dose of budesonide since February 2023.   Prior to collagenous colitis diagnosis, we tried several medications including Imodium, Colestid, dicyclomine, none of which improved her diarrhea.   Aspirin and omeprazole were discontinued in October 2023 and efforts to decrease risk of flare as she was only taking them twice weekly.   Budesonide every third day in October 2023.  Last seen in the office 09/23/2022.  Reported she found out that her cholesterol medication was causing her diarrhea.  She had increase budesonide back to every other day in light of recurrent diarrhea, but since discontinuing cholesterol medication, she had not had any recurrent diarrhea.  No recurrent GERD symptoms.  She had gained 6 pounds back.  Recommended going back to budesonide every third day for 1 month, then attempt stopping the medication and monitoring for recurrent diarrhea.  Today: Has been having trouble with diarrhea since she come off of budesonide.  It has been getting worse with time.  She is having more than 6 watery bowel movements per day.  No nocturnal stools.  She had to take 4 Imodium daily Friday through Sunday which finally  slowed her diarrhea down.  No BRBPR, melena, abdominal pain, nausea, vomiting.  Denies any recent antibiotics, travel, sick contacts.  Recent trial of cholestyramine was not helpful.  NSAIDs/aspirin: None.  PPI: None.   Weight is stable.   Past Medical History:  Diagnosis Date   Arthritis    Carotid artery disease ([facility])    Collagenous colitis 01/2020   COPD (chronic obstructive pulmonary disease) ([facility])    Dysrhythmia    palpitations   GERD (gastroesophageal reflux disease)    Hypercholesteremia    Hypertension    Hypothyroidism    Shortness of breath     Past Surgical History:  Procedure Laterality Date   ABDOMINAL HYSTERECTOMY     APPENDECTOMY     BACK SURGERY  2019   BIOPSY  01/23/2020   Procedure: BIOPSY;  Surgeon: Corbin Ade, MD;  [facility];  Service: Endoscopy;;  ascending, desending colon, sigmoid   BREAST BIOPSY Left 05/04/2017   Procedure: BREAST BIOPSY WITH NEEDLE LOCALIZATION;  Surgeon: Franky Macho, MD;  Location: [facility];  Service: General;  Laterality: Left;  needle loc at 8:00   CATARACT EXTRACTION W/PHACO Left 09/30/2014   Procedure: CATARACT EXTRACTION PHACO AND INTRAOCULAR LENS PLACEMENT LEFT EYE;  Surgeon: Gemma Payor, MD;  Location: [facility];  Service: Ophthalmology;  Laterality: Left;  CDE:8.79   CATARACT EXTRACTION W/PHACO Right 10/17/2014   Procedure: CATARACT EXTRACTION PHACO AND INTRAOCULAR LENS PLACEMENT RIGHT EYE;  Surgeon: Gemma Payor, MD;  Location: [facility];  Service: Ophthalmology;  Laterality: Right;  CDE: 7.80   CHOLECYSTECTOMY N/A  04/30/2013   Procedure: Attempted LAPAROSCOPIC CHOLECYSTECTOMY ; converted to open procedure @ 1510;  Surgeon: Marlane Hatcher, MD;  Location: [facility];  Service: General;  Laterality: N/A;   CHOLECYSTECTOMY N/A 04/30/2013   Procedure: CHOLECYSTECTOMY;  Surgeon: Marlane Hatcher, MD;  Location: [facility];  Service: General;  Laterality: N/A;  procedure started @ 1520   COLONOSCOPY  06/11/2004   External  hemorrhoids, few scattered left-sided diverticula, otherwise normal.  Repeat colonoscopy in 10 years.   COLONOSCOPY N/A 01/23/2020   Procedure: COLONOSCOPY;  Surgeon: Corbin Ade, MD;  Multiple small largemouth diverticula in the sigmoid and descending colon, otherwise normal exam s/p segmental biopsies of right and left colon for histologic study.  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   ESOPHAGOGASTRODUODENOSCOPY  06/11/2004   Distal esophageal erosions consistent with mild erosive reflux esophagitis, heaped up adenomatous appearing prepyloric antral mucosa biopsy, single antral prepyloric ulcer biopsied, normal D1 and D2.   ESOPHAGOGASTRODUODENOSCOPY  10/07/2004   Distal esophageal erosions consistent with erosive reflux esophagitis, otherwise normal esophagus, normal stomach, previously noted ulcer healed completely, normal D1 and D2.   LAMINECTOMY  12/26/2017   spine surgery Dr Channing Mutters   TUBAL LIGATION      Current Outpatient Medications  Medication Sig Dispense Refill   acetaminophen (TYLENOL) 500 MG tablet Take 1,000 mg by mouth every 6 (six) hours as needed.     albuterol (PROVENTIL HFA;VENTOLIN HFA) 108 (90 BASE) MCG/ACT inhaler Inhale 2 puffs into the lungs every 6 (six) hours as needed for wheezing or shortness of breath.     alendronate (FOSAMAX) 70 MG tablet Take 70 mg by mouth once a week.     Ascorbic Acid (VITAMIN C) 1000 MG tablet Take 1,000 mg by mouth daily.     benazepril (LOTENSIN) 10 MG tablet Take 10 mg by mouth at bedtime.      cholecalciferol (VITAMIN D3) 25 MCG (1000 UNIT) tablet Take 1,000 Units by mouth daily.     DULoxetine (CYMBALTA) 30 MG capsule Take 30 mg by mouth daily.     hydrochlorothiazide (MICROZIDE) 12.5 MG capsule Take 12.5 mg by mouth daily.      HYDROcodone-acetaminophen (NORCO) 10-325 MG tablet TAKE 1 TABLET UP TO 4HTIMES DAILY.     levothyroxine (SYNTHROID, LEVOTHROID) 88 MCG tablet Take 88 mcg by mouth daily before breakfast.      loperamide (IMODIUM) 2 MG capsule Take 2-4 mg by mouth as needed for diarrhea or loose stools.     MAGNESIUM PO Take 1 tablet by mouth daily.     MELATONIN PO Take 3 mg by mouth at bedtime.     Multiple Vitamins-Minerals (PRESERVISION AREDS 2) CAPS Take by mouth.     potassium chloride SA (KLOR-CON M) 20 MEQ tablet Take by mouth.     pregabalin (LYRICA) 50 MG capsule Take 50 mg by mouth 3 (three) times daily.     UNABLE TO FIND Med Name: hair, skin, and nail supplement.     ALPRAZolam (XANAX) 0.5 MG tablet Take 0.25 mg by mouth daily as needed for anxiety or sleep.  (Patient not taking: Reported on 09/23/2022)     budesonide (ENTOCORT EC) 3 MG 24 hr capsule Take 3 capsules (9 mg total) by mouth daily for 8 weeks, then decrease to 2 capsules (6 mg total) by mouth daily x 2 weeks, then decrease to 1 capsule (3 mg total) by mouth daily. 180 capsule 0   No current facility-administered medications for this visit.  Facility-Administered Medications Ordered in Other Visits  Medication Dose Route Frequency Provider Last Rate Last Admin   fentaNYL (SUBLIMAZE) injection 25-50 mcg  25-50 mcg Intravenous Q5 min PRN Laurene Footman, MD        Allergies as of 04/25/2023 - Review Complete 04/25/2023  Allergen Reaction Noted   Codeine Nausea And Vomiting 04/26/2013    Family History  Problem Relation Age of Onset   Heart failure Mother    Cancer Father    Colon cancer Neg Hx    Inflammatory bowel disease Neg Hx     Social History   Socioeconomic History   Marital status: Married    Spouse name: Not on file   Number of children: Not on file   Years of education: Not on file   Highest education level: Not on file  Occupational History   Not on file  Tobacco Use   Smoking status: Every Day    Current packs/day: 0.25    Average packs/day: 0.3 packs/day for 55.0 years (13.8 ttl pk-yrs)    Types: Cigarettes   Smokeless tobacco: Never   Tobacco comments:    5-6 cigs/day  Vaping Use    Vaping status: Never Used  Substance and Sexual Activity   Alcohol use: No   Drug use: No   Sexual activity: Yes    Birth control/protection: Surgical  Other Topics Concern   Not on file  Social History Narrative   Not on file   Social Determinants of Health   Financial Resource Strain: Not on file  Food Insecurity: Not on file  Transportation Needs: Not on file  Physical Activity: Not on file  Stress: Not on file  Social Connections: Not on file    Review of Systems: Gen: Denies fever, chills, cold or flulike symptoms, presyncope, syncope. CV: Denies chest pain, palpitations. Resp: Denies dyspnea, cough.  GI: See HPI Heme: See HPI  Physical Exam: BP 135/63 (BP Location: Left Arm, Patient Position: Sitting, Cuff Size: Normal)   Pulse 71   Temp 97.7 F (36.5 C) (Temporal)   Ht 5\' 5"  (1.651 m)   Wt 145 lb 6.4 oz (66 kg)   SpO2 95%   BMI 24.20 kg/m  General:   Alert and oriented. No distress noted. Pleasant and cooperative.  Head:  Normocephalic and atraumatic. Eyes:  Conjuctiva clear without scleral icterus. Heart:  S1, S2 present without murmurs appreciated. Lungs:  Clear to auscultation bilaterally. No wheezes, rales, or rhonchi. No distress.  Abdomen:  +BS, soft, non-tender and non-distended. No rebound or guarding. No HSM or masses noted. Msk:  Symmetrical without gross deformities. Normal posture. Extremities:  Without edema. Neurologic:  Alert and  oriented x4 Psych:  Normal mood and affect.    Assessment:  82 y.o. female presenting today for follow-up of collagenous colitis.   Collagenous colitis was diagnosed in May 2021.  She has been treated with budesonide, but has never been able to taper off completely without return of diarrhea after a few months. Unfortunately, symptoms are not controlled well with imodium. We had tried several medications including Imodium, Colestid, dicyclomine prior to diagnosis of collagenous colitis without improvement.  Ultimately, we had been maintaining on the lowest effective dose of budesonide since February 2023, and she had been doing well with budesonide 3 mg every 3 days.  However, she discontinued budesonide in February 2024 and has had recurrent diarrhea since March 2024 that has been slowly progressing, most recently with more than 6 watery bowel movements daily.  No nocturnal stools, unintentional weight loss, BRBPR, melena, recent antibiotics. Recent trial of cholestyramine unhelpful. She is not taking any NSAIDs or PPI.   I suspect recurrent diarrhea is secondary to refract collagenous colitis.  We did discuss the role of Biologics versus immunomodulators, but ultimately decided to proceed with resuming budesonide.   Plan:  Budesonide 9 mg daily x 8 weeks, then decrease to 6 mg daily x 2 weeks, then 3 mg daily.  Follow-up in 8 weeks or sooner if needed. Patient will let me know if she doesn't notice improvement after 1 week with budesonide.    Ermalinda Memos, PA-C [facility] 04/25/2023

## 2023-04-25 ENCOUNTER — Ambulatory Visit: Payer: Medicare Other | Admitting: Gastroenterology

## 2023-04-25 ENCOUNTER — Encounter: Payer: Self-pay | Admitting: Gastroenterology

## 2023-04-25 VITALS — BP 135/63 | HR 71 | Temp 97.7°F | Ht 65.0 in | Wt 145.4 lb

## 2023-04-25 DIAGNOSIS — K52831 Collagenous colitis: Secondary | ICD-10-CM | POA: Diagnosis not present

## 2023-04-25 MED ORDER — BUDESONIDE 3 MG PO CPEP: ORAL_CAPSULE | ORAL | 0 refills | Status: DC

## 2023-04-25 NOTE — Patient Instructions (Signed)
Start budesonide 9 mg daily x 8 weeks, then decrease to 6 mg daily x 2 weeks, then decrease to 3 mg daily.  Will plan to see back in 8 weeks or sooner if needed.  It was great to see you again today!   Ermalinda Memos, PA-C [facility]

## 2023-06-03 ENCOUNTER — Other Ambulatory Visit (HOSPITAL_COMMUNITY): Payer: Self-pay | Admitting: Cardiovascular Disease

## 2023-06-03 DIAGNOSIS — I6523 Occlusion and stenosis of bilateral carotid arteries: Secondary | ICD-10-CM

## 2023-06-05 NOTE — Progress Notes (Unsigned)
Cardiology Clinic Note   Patient Name: [name] Date of Encounter: 06/06/2023  Primary Care Provider:  Gareth Morgan, MD Primary Cardiologist:  Nanetta Batty, MD  Patient Profile    82 year old female carotid artery disease (moderate left internal carotid artery stenosis), hypertension and hyperlipidemia and ongoing tobacco abuse.   She is now being followed by Dr. Dwana Melena as primary care provider as Dr. [name] has retired.  They drew blood last week and she is uncertain the results.  She denies any new symptoms.  Past Medical History    Past Medical History:  Diagnosis Date   Arthritis    Carotid artery disease (HCC)    Collagenous colitis 01/2020   COPD (chronic obstructive pulmonary disease) (HCC)    Dysrhythmia    palpitations   GERD (gastroesophageal reflux disease)    Hypercholesteremia    Hypertension    Hypothyroidism    Shortness of breath    Past Surgical History:  Procedure Laterality Date   ABDOMINAL HYSTERECTOMY     APPENDECTOMY     BACK SURGERY  2019   BIOPSY  01/23/2020   Procedure: BIOPSY;  Surgeon: Corbin Ade, MD;  Location: [facility];  Service: Endoscopy;;  ascending, desending colon, sigmoid   BREAST BIOPSY Left 05/04/2017   Procedure: BREAST BIOPSY WITH NEEDLE LOCALIZATION;  Surgeon: Franky Macho, MD;  Location: [facility];  Service: General;  Laterality: Left;  needle loc at 8:00   CATARACT EXTRACTION W/PHACO Left 09/30/2014   Procedure: CATARACT EXTRACTION PHACO AND INTRAOCULAR LENS PLACEMENT LEFT EYE;  Surgeon: Gemma Payor, MD;  Location: [facility];  Service: Ophthalmology;  Laterality: Left;  CDE:8.79   CATARACT EXTRACTION W/PHACO Right 10/17/2014   Procedure: CATARACT EXTRACTION PHACO AND INTRAOCULAR LENS PLACEMENT RIGHT EYE;  Surgeon: Gemma Payor, MD;  Location: [facility];  Service: Ophthalmology;  Laterality: Right;  CDE: 7.80   CHOLECYSTECTOMY N/A 04/30/2013   Procedure: Attempted LAPAROSCOPIC CHOLECYSTECTOMY ; converted to open procedure @  1510;  Surgeon: Marlane Hatcher, MD;  Location: [facility];  Service: General;  Laterality: N/A;   CHOLECYSTECTOMY N/A 04/30/2013   Procedure: CHOLECYSTECTOMY;  Surgeon: Marlane Hatcher, MD;  Location: [facility];  Service: General;  Laterality: N/A;  procedure started @ 1520   COLONOSCOPY  06/11/2004   External hemorrhoids, few scattered left-sided diverticula, otherwise normal.  Repeat colonoscopy in 10 years.   COLONOSCOPY N/A 01/23/2020   Procedure: COLONOSCOPY;  Surgeon: Corbin Ade, MD;  Multiple small largemouth diverticula in the sigmoid and descending colon, otherwise normal exam s/p segmental biopsies of right and left colon for histologic study.  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   ESOPHAGOGASTRODUODENOSCOPY  06/11/2004   Distal esophageal erosions consistent with mild erosive reflux esophagitis, heaped up adenomatous appearing prepyloric antral mucosa biopsy, single antral prepyloric ulcer biopsied, normal D1 and D2.   ESOPHAGOGASTRODUODENOSCOPY  10/07/2004   Distal esophageal erosions consistent with erosive reflux esophagitis, otherwise normal esophagus, normal stomach, previously noted ulcer healed completely, normal D1 and D2.   LAMINECTOMY  12/26/2017   spine surgery Dr Channing Mutters   TUBAL LIGATION      Allergies  Allergies  Allergen Reactions   Codeine Nausea And Vomiting    History of Present Illness    Mrs. [name] comes today without any cardiac complaints.  She remains active, she does have some arthritic pain in her legs which does slow her down some.  She denies chest pain, she does have some mild dyspnea on  exertion which she uses inhalers for first thing in the morning along with coughing.  Unfortunately she continues to smoke.  She denies any dizziness, near-syncope, or intermittent claudication symptoms.  She is medically compliant.  Home Medications    Current Outpatient Medications  Medication Sig Dispense Refill   acetaminophen (TYLENOL)  500 MG tablet Take 1,000 mg by mouth every 6 (six) hours as needed.     albuterol (PROVENTIL HFA;VENTOLIN HFA) 108 (90 BASE) MCG/ACT inhaler Inhale 2 puffs into the lungs every 6 (six) hours as needed for wheezing or shortness of breath.     alendronate (FOSAMAX) 70 MG tablet Take 70 mg by mouth once a week.     ALPRAZolam (XANAX) 0.5 MG tablet Take 0.25 mg by mouth daily as needed for anxiety or sleep.     Ascorbic Acid (VITAMIN C) 1000 MG tablet Take 1,000 mg by mouth daily.     benazepril (LOTENSIN) 10 MG tablet Take 10 mg by mouth at bedtime.      budesonide (ENTOCORT EC) 3 MG 24 hr capsule Take 3 capsules (9 mg total) by mouth daily for 8 weeks, then decrease to 2 capsules (6 mg total) by mouth daily x 2 weeks, then decrease to 1 capsule (3 mg total) by mouth daily. 180 capsule 0   cholecalciferol (VITAMIN D3) 25 MCG (1000 UNIT) tablet Take 1,000 Units by mouth daily.     DULoxetine (CYMBALTA) 30 MG capsule Take 30 mg by mouth daily.     ezetimibe (ZETIA) 10 MG tablet Take 1 tablet (10 mg total) by mouth daily. 90 tablet 3   hydrochlorothiazide (MICROZIDE) 12.5 MG capsule Take 12.5 mg by mouth daily.      HYDROcodone-acetaminophen (NORCO) 10-325 MG tablet TAKE 1 TABLET UP TO 4HTIMES DAILY.     levothyroxine (SYNTHROID, LEVOTHROID) 88 MCG tablet Take 88 mcg by mouth daily before breakfast.     loperamide (IMODIUM) 2 MG capsule Take 2-4 mg by mouth as needed for diarrhea or loose stools.     MAGNESIUM PO Take 1 tablet by mouth daily.     MELATONIN PO Take 3 mg by mouth at bedtime.     Multiple Vitamins-Minerals (PRESERVISION AREDS 2) CAPS Take by mouth.     potassium chloride SA (KLOR-CON M) 20 MEQ tablet Take by mouth.     pregabalin (LYRICA) 50 MG capsule Take 50 mg by mouth 3 (three) times daily.     UNABLE TO FIND Med Name: hair, skin, and nail supplement.     No current facility-administered medications for this visit.   Facility-Administered Medications Ordered in Other Visits   Medication Dose Route Frequency Provider Last Rate Last Admin   fentaNYL (SUBLIMAZE) injection 25-50 mcg  25-50 mcg Intravenous Q5 min PRN Laurene Footman, MD         Family History    Family History  Problem Relation Age of Onset   Heart failure Mother    Cancer Father    Colon cancer Neg Hx    Inflammatory bowel disease Neg Hx    She indicated that her mother is deceased. She indicated that her father is deceased. She indicated that her sister is alive. She indicated that her child is alive. She indicated that the status of her neg hx is unknown. She indicated that her other is alive.  Social History    Social History   Socioeconomic History   Marital status: Married    Spouse name: Not on file   Number of  children: Not on file   Years of education: Not on file   Highest education level: Not on file  Occupational History   Not on file  Tobacco Use   Smoking status: Every Day    Current packs/day: 0.25    Average packs/day: 0.3 packs/day for 55.0 years (13.8 ttl pk-yrs)    Types: Cigarettes   Smokeless tobacco: Never   Tobacco comments:    5-6 cigs/day  Vaping Use   Vaping status: Never Used  Substance and Sexual Activity   Alcohol use: No   Drug use: No   Sexual activity: Yes    Birth control/protection: Surgical  Other Topics Concern   Not on file  Social History Narrative   Not on file   Social Determinants of Health   Financial Resource Strain: Not on file  Food Insecurity: Not on file  Transportation Needs: Not on file  Physical Activity: Not on file  Stress: Not on file  Social Connections: Not on file  Intimate Partner Violence: Not on file     Review of Systems    General:  No chills, fever, night sweats or weight changes.  Cardiovascular:  No chest pain, dyspnea on exertion, edema, orthopnea, palpitations, paroxysmal nocturnal dyspnea. Dermatological: No rash, lesions/masses Respiratory: No cough, dyspnea Urologic: No hematuria,  dysuria Abdominal:   No nausea, vomiting, diarrhea, bright red blood per rectum, melena, or hematemesis Neurologic:  No visual changes, wkns, changes in mental status. All other systems reviewed and are otherwise negative except as noted above.       Physical Exam    VS:  BP (!) 146/50 (BP Location: Left Arm, Patient Position: Sitting, Cuff Size: Normal)   Pulse (!) 59   Ht 5\' 5"  (1.651 m)   Wt 143 lb (64.9 kg)   BMI 23.80 kg/m  , BMI Body mass index is 23.8 kg/m.     GEN: Well nourished, well developed, in no acute distress. HEENT: normal. Neck: Supple, no JVD, right carotid bruit, none on the left, no masses. Cardiac: RRR, no murmurs, rubs, or gallops. No clubbing, cyanosis, edema.  Radials/DP/PT 2+ and equal bilaterally.  Respiratory:  Respirations regular and unlabored, clear to auscultation bilaterally. GI: Soft, nontender, nondistended, BS + x 4. MS: no deformity or atrophy. Skin: warm and dry, no rash. Neuro:  Strength and sensation are intact. Psych: Normal affect.      Lab Results  Component Value Date   WBC 5.9 03/01/2022   HGB 10.6 (L) 03/01/2022   HCT 33.6 (L) 03/01/2022   MCV 88.9 03/01/2022   PLT 194 03/01/2022   Lab Results  Component Value Date   CREATININE 1.21 (H) 03/01/2022   BUN 26 (H) 03/01/2022   NA 134 (L) 03/01/2022   K 4.9 03/01/2022   CL 105 03/01/2022   CO2 25 03/01/2022   Lab Results  Component Value Date   ALT 24 03/01/2022   AST 31 03/01/2022   ALKPHOS 98 03/01/2022   BILITOT 0.6 03/01/2022   Lab Results  Component Value Date   CHOL 155 12/23/2020   HDL 63 12/23/2020   LDLCALC 69 12/23/2020   TRIG 130 12/23/2020   CHOLHDL 2.5 12/23/2020    No results found for: "HGBA1C"   Review of Prior Studies  Carotid Artery Ultrasound: 06/06/2023  Right Carotid: Velocities in the right ICA are consistent with a 40-59% stenosis. Non-hemodynamically significant plaque <50% noted  In the CCA. The ECA appears >50% stenosed.   Left  Carotid: Velocities  in the left ICA are consistent with a 60-79%  stenosis. Non-hemodynamically significant plaque <50% noted in the  CCA.   Assessment & Plan   1.  Carotid artery disease: Had carotid artery ultrasound done today prior to office visit and was read by Dr. Gery Pray.  Compared to prior ultrasound the right ICA remains stable with 40 to 59% stenosis noted, but has increased on the left from 40 to 59% now to 60 to 79% stenosis.     2.  Hypertension: Blood pressure currently slightly elevated but stable.  She is to continue her current medication regimen for now which includes benazepril 10 mg daily.   3.  Hypercholesterolemia: She refuses statin therapy.  I will start her on Zetia 10 mg daily especially in the setting of progression of carotid artery disease.  She will not tolerate aspirin as she has chronic colitis.  4.  Ongoing tobacco abuse: The patient refuses smoking cessation.  She says she has tried for years and does not wish to stop.  She is cut down to 10 cigarettes a day.  She states she uses it for nerves.  I have explained to her the dangers of continuing to smoke especially with her progression of her carotid artery disease.  She verbalizes understanding and is willing to take the risk despite our warnings.         Signed, Bettey Mare. Liborio Nixon, ANP, AACC   06/06/2023 4:39 PM      Office [phone] Fax [phone]  Notice: This dictation was prepared with Dragon dictation along with smaller phrase technology. Any transcriptional errors that result from this process are unintentional and may not be corrected upon review.

## 2023-06-06 ENCOUNTER — Ambulatory Visit (INDEPENDENT_AMBULATORY_CARE_PROVIDER_SITE_OTHER): Payer: Medicare Other | Admitting: Adult Health

## 2023-06-06 ENCOUNTER — Ambulatory Visit
Admission: RE | Admit: 2023-06-06 | Discharge: 2023-06-06 | Disposition: A | Discharge status: Home / Self Care | Payer: Medicare Other | Source: Ambulatory Visit | Attending: Cardiology | Admitting: Cardiology

## 2023-06-06 ENCOUNTER — Encounter: Payer: Self-pay | Admitting: Adult Health

## 2023-06-06 VITALS — BP 146/50 | HR 59 | Ht 65.0 in | Wt 143.0 lb

## 2023-06-06 DIAGNOSIS — I6523 Occlusion and stenosis of bilateral carotid arteries: Secondary | ICD-10-CM | POA: Diagnosis present

## 2023-06-06 DIAGNOSIS — I10 Essential (primary) hypertension: Secondary | ICD-10-CM

## 2023-06-06 DIAGNOSIS — E7800 Pure hypercholesterolemia, unspecified: Secondary | ICD-10-CM

## 2023-06-06 DIAGNOSIS — Z720 Tobacco use: Secondary | ICD-10-CM | POA: Diagnosis present

## 2023-06-06 MED ORDER — EZETIMIBE 10 MG PO TABS: 10.0000 mg | ORAL_TABLET | Freq: Every day | ORAL | 3 refills | Status: DC

## 2023-06-06 NOTE — Patient Instructions (Signed)
Medication Instructions:  Start Zetia 10 mg ( Take 1 Tablet Daily) *If you need a refill on your cardiac medications before your next appointment, please call your pharmacy*   Lab Work: No labs If you have labs (blood work) drawn today and your tests are completely normal, you will receive your results only by: MyChart Message (if you have MyChart) OR A paper copy in the mail If you have any lab test that is abnormal or we need to change your treatment, we will call you to review the results.   Testing/Procedures: No Testing   Follow-Up: At [facility], you and your health needs are our priority.  As part of our continuing mission to provide you with exceptional heart care, we have created designated Provider Care Teams.  These Care Teams include your primary Cardiologist (physician) and Advanced Practice Providers (APPs -  Physician Assistants and Nurse Practitioners) who all work together to provide you with the care you need, when you need it.  We recommend signing up for the patient portal called "MyChart".  Sign up information is provided on this After Visit Summary.  MyChart is used to connect with patients for Virtual Visits (Telemedicine).  Patients are able to view lab/test results, encounter notes, upcoming appointments, etc.  Non-urgent messages can be sent to your provider as well.   To learn more about what you can do with MyChart, go to [email or web address].    Your next appointment:   1 year(s)  Provider:   Nanetta Batty, MD   Other Instructions Please Call Office to Let us know if Zetia is working.

## 2023-06-17 ENCOUNTER — Telehealth: Payer: Self-pay | Admitting: Cardiovascular Disease

## 2023-06-17 DIAGNOSIS — E782 Mixed hyperlipidemia: Secondary | ICD-10-CM

## 2023-06-17 DIAGNOSIS — E7800 Pure hypercholesterolemia, unspecified: Secondary | ICD-10-CM

## 2023-06-17 NOTE — Telephone Encounter (Signed)
Left message for pt to call back  °

## 2023-06-17 NOTE — Telephone Encounter (Signed)
Pt c/o medication issue:  1. Name of Medication: ezetimibe (ZETIA) 10 MG tablet   2. How are you currently taking this medication (dosage and times per day)?    3. Are you having a reaction (difficulty breathing--STAT)? no  4. What is your medication issue? Patient has diarrhea with the medication. Calling to see if there something else she can take.

## 2023-06-20 ENCOUNTER — Encounter (INDEPENDENT_AMBULATORY_CARE_PROVIDER_SITE_OTHER): Payer: Self-pay

## 2023-06-20 ENCOUNTER — Ambulatory Visit: Payer: Medicare Other | Admitting: Gastroenterology

## 2023-06-20 NOTE — Telephone Encounter (Signed)
Continue to hold zetia. Can offer referral to lipid clinic. Med added to allergy list and d/c from med list.

## 2023-06-20 NOTE — Telephone Encounter (Signed)
Patient states Zetia is causing diarrhea.  She stopped the medication on Thursday/Friday last week.  She continued with the GI issues through yesterday.  She states it started when she started the Zetia. She has had GI issues for several years and had been doing well until she started the medications.  She states she had tried this medication before and had issues with it then the same as now. She will continue to hold the medication.  Please advise

## 2023-06-20 NOTE — Telephone Encounter (Signed)
Called patient and LM to call office.

## 2023-06-20 NOTE — Telephone Encounter (Signed)
Call to patient to discuss recommendations.   Zetia added to allergies.  DC from meds.  Referral sent.

## 2023-06-21 ENCOUNTER — Other Ambulatory Visit (HOSPITAL_COMMUNITY): Payer: Self-pay | Admitting: Internal Medicine

## 2023-06-21 DIAGNOSIS — Z1231 Encounter for screening mammogram for malignant neoplasm of breast: Secondary | ICD-10-CM

## 2023-06-21 NOTE — Telephone Encounter (Signed)
Call to patient and advised of recommendations.  She states understanding

## 2023-06-23 ENCOUNTER — Ambulatory Visit: Payer: Medicare Other | Admitting: Gastroenterology

## 2023-06-26 NOTE — Progress Notes (Signed)
Referring Provider: Gareth Morgan, MD Primary Care Physician:  Gareth Morgan, MD Primary GI Physician: Dr. Jena Gauss  Chief Complaint  Patient presents with   Follow-up    Follow up. Still having some diarrhea    HPI:   [name] is a 82 y.o. female presenting today for follow-up of collagenous colitis.    Collagenous colitis was diagnosed in May 2021.  She has been treated with budesonide, but has never been able to taper off completely without return of diarrhea after a few months. Unfortunately, symptoms are not controlled well with imodium. We had tried several medications including Imodium, Colestid, dicyclomine prior to diagnosis of collagenous colitis without improvement. Ultimately, we had been maintaining on the lowest effective dose of budesonide since February 2023, and she had been doing well with budesonide 3 mg every 3 days. Attempted discontinuing budesonide after 1 year of therapy in February 2024, but she had recurrent diarrhea in March.   Last seen in our office 04/25/2023 reporting progressively worsening diarrhea since March 2024, now having 6 watery bowel movements daily.  Denies nocturnal stools, unintentional weight loss, BRBPR, melena, recent antibiotics.  Recent trial of cholestyramine not helpful.  She was not taking any NSAIDs or PPI.  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  We discussed the role of Biologics versus immunomodulators, but ultimately decided to proceed with resuming budesonide.  Plan for budesonide 9 mg daily x 8 weeks, then decrease to 6 mg daily for 2 weeks, then 3 mg daily thereafter.  Plan to follow-up in 8 weeks.  Requested she let me know if she was not noticing improvement after 1 week of budesonide.  Today: Reports she was doing well with about 2 normal Bms daily until starting Zetia on 9/17. After a couple doses, she developed recurrent diarrhea with 5-6 Bms daily. Stopped Zetia and diarrhea eventially  tapered off. Having about 2 Bms daily again at this point though she did have 3 this morning. Not watery.   She is still taking budesonide. Decreased to 6 mg per day a few days ago, but can't tell me the exact day.   Had a tooth pulled Monday and started an antibiotic for a few days. Thinks she just has 1 more pill. Doesn't know the name of the antibiotic.   No brbpr, melena, or abdominal pain.     Past Medical History:  Diagnosis Date   Arthritis    Carotid artery disease (HCC)    Collagenous colitis 01/2020   COPD (chronic obstructive pulmonary disease) (HCC)    Dysrhythmia    palpitations   GERD (gastroesophageal reflux disease)    Hypercholesteremia    Hypertension    Hypothyroidism    Shortness of breath     Past Surgical History:  Procedure Laterality Date   ABDOMINAL HYSTERECTOMY     APPENDECTOMY     BACK SURGERY  2019   BIOPSY  01/23/2020   Procedure: BIOPSY;  Surgeon: Corbin Ade, MD;  [facility];  Service: Endoscopy;;  ascending, desending colon, sigmoid   BREAST BIOPSY Left 05/04/2017   Procedure: BREAST BIOPSY WITH NEEDLE LOCALIZATION;  Surgeon: Franky Macho, MD;  Location: [facility];  Service: General;  Laterality: Left;  needle loc at 8:00   CATARACT EXTRACTION W/PHACO Left 09/30/2014   Procedure: CATARACT EXTRACTION PHACO AND INTRAOCULAR LENS PLACEMENT LEFT EYE;  Surgeon: Gemma Payor, MD;  Location: [facility];  Service: Ophthalmology;  Laterality: Left;  CDE:8.79   CATARACT  EXTRACTION W/PHACO Right 10/17/2014   Procedure: CATARACT EXTRACTION PHACO AND INTRAOCULAR LENS PLACEMENT RIGHT EYE;  Surgeon: Gemma Payor, MD;  Location: [facility];  Service: Ophthalmology;  Laterality: Right;  CDE: 7.80   CHOLECYSTECTOMY N/A 04/30/2013   Procedure: Attempted LAPAROSCOPIC CHOLECYSTECTOMY ; converted to open procedure @ 1510;  Surgeon: Marlane Hatcher, MD;  Location: [facility];  Service: General;  Laterality: N/A;   CHOLECYSTECTOMY N/A 04/30/2013   Procedure:  CHOLECYSTECTOMY;  Surgeon: Marlane Hatcher, MD;  Location: [facility];  Service: General;  Laterality: N/A;  procedure started @ 1520   COLONOSCOPY  06/11/2004   External hemorrhoids, few scattered left-sided diverticula, otherwise normal.  Repeat colonoscopy in 10 years.   COLONOSCOPY N/A 01/23/2020   Procedure: COLONOSCOPY;  Surgeon: Corbin Ade, MD;  Multiple small largemouth diverticula in the sigmoid and descending colon, otherwise normal exam s/p segmental biopsies of right and left colon for histologic study.  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   ESOPHAGOGASTRODUODENOSCOPY  06/11/2004   Distal esophageal erosions consistent with mild erosive reflux esophagitis, heaped up adenomatous appearing prepyloric antral mucosa biopsy, single antral prepyloric ulcer biopsied, normal D1 and D2.   ESOPHAGOGASTRODUODENOSCOPY  10/07/2004   Distal esophageal erosions consistent with erosive reflux esophagitis, otherwise normal esophagus, normal stomach, previously noted ulcer healed completely, normal D1 and D2.   LAMINECTOMY  12/26/2017   spine surgery Dr Channing Mutters   TUBAL LIGATION      Current Outpatient Medications  Medication Sig Dispense Refill   acetaminophen (TYLENOL) 500 MG tablet Take 1,000 mg by mouth every 6 (six) hours as needed.     albuterol (PROVENTIL HFA;VENTOLIN HFA) 108 (90 BASE) MCG/ACT inhaler Inhale 2 puffs into the lungs every 6 (six) hours as needed for wheezing or shortness of breath.     alendronate (FOSAMAX) 70 MG tablet Take 70 mg by mouth once a week.     ALPRAZolam (XANAX) 0.5 MG tablet Take 0.25 mg by mouth daily as needed for anxiety or sleep.     Ascorbic Acid (VITAMIN C) 1000 MG tablet Take 1,000 mg by mouth daily.     benazepril (LOTENSIN) 10 MG tablet Take 10 mg by mouth at bedtime.      budesonide (ENTOCORT EC) 3 MG 24 hr capsule Take 3 capsules (9 mg total) by mouth daily for 8 weeks, then decrease to 2 capsules (6 mg total) by mouth daily x 2  weeks, then decrease to 1 capsule (3 mg total) by mouth daily. 180 capsule 0   cholecalciferol (VITAMIN D3) 25 MCG (1000 UNIT) tablet Take 1,000 Units by mouth daily.     DULoxetine (CYMBALTA) 30 MG capsule Take 30 mg by mouth daily.     hydrochlorothiazide (MICROZIDE) 12.5 MG capsule Take 12.5 mg by mouth daily.      HYDROcodone-acetaminophen (NORCO) 10-325 MG tablet TAKE 1 TABLET UP TO 4HTIMES DAILY.     levothyroxine (SYNTHROID, LEVOTHROID) 88 MCG tablet Take 88 mcg by mouth daily before breakfast.     loperamide (IMODIUM) 2 MG capsule Take 2-4 mg by mouth as needed for diarrhea or loose stools.     MAGNESIUM PO Take 1 tablet by mouth daily.     MELATONIN PO Take 3 mg by mouth at bedtime.     Multiple Vitamins-Minerals (PRESERVISION AREDS 2) CAPS Take by mouth.     potassium chloride SA (KLOR-CON M) 20 MEQ tablet Take by mouth.     pregabalin (LYRICA) 50 MG capsule Take 50 mg by  mouth 3 (three) times daily.     UNABLE TO FIND Med Name: hair, skin, and nail supplement.     No current facility-administered medications for this visit.   Facility-Administered Medications Ordered in Other Visits  Medication Dose Route Frequency Provider Last Rate Last Admin   fentaNYL (SUBLIMAZE) injection 25-50 mcg  25-50 mcg Intravenous Q5 min PRN Laurene Footman, MD        Allergies as of 06/29/2023 - Review Complete 06/29/2023  Allergen Reaction Noted   Codeine Nausea And Vomiting 04/26/2013   Zetia [ezetimibe] Diarrhea and Itching 06/20/2023    Family History  Problem Relation Age of Onset   Heart failure Mother    Cancer Father    Colon cancer Neg Hx    Inflammatory bowel disease Neg Hx     Social History   Socioeconomic History   Marital status: Married    Spouse name: Not on file   Number of children: Not on file   Years of education: Not on file   Highest education level: Not on file  Occupational History   Not on file  Tobacco Use   Smoking status: Every Day    Current packs/day:  0.25    Average packs/day: 0.3 packs/day for 55.0 years (13.8 ttl pk-yrs)    Types: Cigarettes   Smokeless tobacco: Never   Tobacco comments:    5-6 cigs/day  Vaping Use   Vaping status: Never Used  Substance and Sexual Activity   Alcohol use: No   Drug use: No   Sexual activity: Yes    Birth control/protection: Surgical  Other Topics Concern   Not on file  Social History Narrative   Not on file   Social Determinants of Health   Financial Resource Strain: Not on file  Food Insecurity: Not on file  Transportation Needs: Not on file  Physical Activity: Not on file  Stress: Not on file  Social Connections: Not on file    Review of Systems: Gen: Denies fever, chills, cold or flu like symptoms, pre-syncope, or syncope.  GI: See HPI Heme: See HPI  Physical Exam: BP 134/63 (BP Location: Right Arm, Patient Position: Sitting, Cuff Size: Normal)   Pulse 74   Temp 97.8 F (36.6 C) (Temporal)   Ht 5\' 5"  (1.651 m)   Wt 142 lb 12.8 oz (64.8 kg)   SpO2 96%   BMI 23.76 kg/m  General:   Alert and oriented. No distress noted. Pleasant and cooperative.  Head:  Normocephalic and atraumatic. Eyes:  Conjuctiva clear without scleral icterus. Heart:  S1, S2 present without murmurs appreciated. Lungs:  Clear to auscultation bilaterally. No wheezes, rales, or rhonchi. No distress.  Abdomen:  +BS, soft, non-tender and non-distended. No rebound or guarding. No HSM or masses noted. Msk:  Symmetrical without gross deformities. Normal posture. Extremities:  Without edema. Neurologic:  Alert and  oriented x4 Psych:  Normal mood and affect.    Assessment:  82 y.o. female presenting today for follow-up of collagenous colitis.    Collagenous colitis was diagnosed in May 2021.  She has been treated with budesonide, but has never been able to taper off completely without return of diarrhea after a few months. We had tried several medications including Imodium, Colestid, dicyclomine prior to  diagnosis of collagenous colitis without improvement. Ultimately, we had been maintaining on the lowest effective dose of budesonide since February 2023, and she had been doing well with budesonide 3 mg every 3 days.  Tried discontinuing budesonide after  1 year of therapy in February 2024, but she had recurrent diarrhea and was restarted on dicyclomine 9 mg daily on 04/25/23 with plans to treat for 8 weeks followed by a taper of 6 mg daily x 2 weeks then 3 mg daily thereafter.   She was doing well until starting Zetia on 9/17 and developed recurrent diarrhea, but Zetia was discontinued, and diarrhea has tapered off once again. She recently decreased to budesonide 6 mg daily which we will continue for now with plans to continue the taper as long as she continues to do well.    Plan:  Continue budesonide 6 mg daily for a total of 2 weeks followed by budesonide 3 mg daily as long as she continues to do well.  Requested progress report from patient next week or sooner if diarrhea returns.  Follow-up in 4 weeks.    Ermalinda Memos, PA-C [facility] 06/29/2023

## 2023-06-29 ENCOUNTER — Encounter: Payer: Self-pay | Admitting: Gastroenterology

## 2023-06-29 ENCOUNTER — Ambulatory Visit: Payer: Medicare Other | Admitting: Gastroenterology

## 2023-06-29 VITALS — BP 134/63 | HR 74 | Temp 97.8°F | Ht 65.0 in | Wt 142.8 lb

## 2023-06-29 DIAGNOSIS — K52831 Collagenous colitis: Secondary | ICD-10-CM | POA: Diagnosis not present

## 2023-06-29 NOTE — Patient Instructions (Signed)
Continue budesonide 6 mg daily for now.  Please call next week to let me know how you are doing.  I will plan to see back in the office in 4 weeks or sooner if needed.  Ermalinda Memos, PA-C [facility]

## 2023-07-01 ENCOUNTER — Encounter: Payer: Self-pay | Admitting: Gastroenterology

## 2023-07-06 ENCOUNTER — Telehealth: Payer: Self-pay | Admitting: *Deleted

## 2023-07-06 NOTE — Telephone Encounter (Signed)
Fantastic! Continue Budesonide 2 pills (6 mg total) daily for 2 weeks, then decrease to 1 pill daily. We will see her back in the office as scheduled. Have her call back if worsening symptoms before I see her back in the office.

## 2023-07-06 NOTE — Telephone Encounter (Signed)
Pt called and states diarrhea is much better.

## 2023-07-07 NOTE — Telephone Encounter (Signed)
Spoke to pt, informed her of recommendations. She voiced understanding.

## 2023-07-18 ENCOUNTER — Encounter (HOSPITAL_COMMUNITY): Payer: Self-pay

## 2023-07-18 ENCOUNTER — Ambulatory Visit (HOSPITAL_COMMUNITY)
Admission: RE | Admit: 2023-07-18 | Discharge: 2023-07-18 | Disposition: A | Discharge status: Home / Self Care | Payer: Medicare Other | Source: Ambulatory Visit | Attending: Internal Medicine | Admitting: Internal Medicine

## 2023-07-18 DIAGNOSIS — Z1231 Encounter for screening mammogram for malignant neoplasm of breast: Secondary | ICD-10-CM | POA: Diagnosis present

## 2023-07-25 NOTE — Progress Notes (Unsigned)
Referring Provider: Gareth Morgan, MD Primary Care Physician:  Gareth Morgan, MD Primary GI Physician: Dr. Jena Gauss  Chief Complaint  Patient presents with   Follow-up    Follow up. No problems     HPI:   [name] is a 82 y.o. female presenting today for follow-up of collagenous colitis.  Collagenous colitis was diagnosed in May 2021.  She has been treated with budesonide, but has never been able to taper off completely without return of diarrhea after a few months. We had tried several medications including Imodium, Colestid, dicyclomine prior to diagnosis of collagenous colitis without improvement. Ultimately, we had been maintaining on the lowest effective dose of budesonide since February 2023, and she had been doing well with budesonide 3 mg every 3 days.  Tried discontinuing budesonide after 1 year of therapy in February 2024, but she had recurrent diarrhea and was restarted on dicyclomine 9 mg daily on 04/25/23 with plans to treat for 8 weeks followed by a taper of 6 mg daily x 2 weeks then 3 mg daily thereafter.   Last seen in our office 06/29/2023.  Reported she had been doing well until starting Zetia on 9/17 and developed recurrent diarrhea.  Zetia had then been discontinued and diarrhea was tapering off once again.  She had recently decreased budesonide to 6 mg daily.  Recommended continuing budesonide 6 mg for 2 weeks with plans to continue the taper as long as she continues to do well.  Recommended 4-week follow-up.  Patient called 10/16 reporting diarrhea was doing much better.  Today: Reports she is doing well. Having 1 BM a day. No further diarrhea. Last diarrhea was Sunday. Thinks this was secondary to nerves as her husband ended up in the hospital. She had 4-5 BMs Sunday, but back to 1 BM per days since then.  No abdominal pain, nausea, vomiting, brbpr, or melena.  Weight is stable.   Past Medical History:  Diagnosis Date   Arthritis    Carotid artery disease (HCC)     Collagenous colitis 01/2020   COPD (chronic obstructive pulmonary disease) (HCC)    Dysrhythmia    palpitations   GERD (gastroesophageal reflux disease)    Hypercholesteremia    Hypertension    Hypothyroidism    Shortness of breath     Past Surgical History:  Procedure Laterality Date   ABDOMINAL HYSTERECTOMY     APPENDECTOMY     BACK SURGERY  2019   BIOPSY  01/23/2020   Procedure: BIOPSY;  Surgeon: Corbin Ade, MD;  [facility];  Service: Endoscopy;;  ascending, desending colon, sigmoid   BREAST BIOPSY Left 05/04/2017   Procedure: BREAST BIOPSY WITH NEEDLE LOCALIZATION;  Surgeon: Franky Macho, MD;  Location: [facility];  Service: General;  Laterality: Left;  needle loc at 8:00   BREAST EXCISIONAL BIOPSY Left 2018   FIBROCYSTIC CHANGES   CATARACT EXTRACTION W/PHACO Left 09/30/2014   Procedure: CATARACT EXTRACTION PHACO AND INTRAOCULAR LENS PLACEMENT LEFT EYE;  Surgeon: Gemma Payor, MD;  Location: [facility];  Service: Ophthalmology;  Laterality: Left;  CDE:8.79   CATARACT EXTRACTION W/PHACO Right 10/17/2014   Procedure: CATARACT EXTRACTION PHACO AND INTRAOCULAR LENS PLACEMENT RIGHT EYE;  Surgeon: Gemma Payor, MD;  Location: [facility];  Service: Ophthalmology;  Laterality: Right;  CDE: 7.80   CHOLECYSTECTOMY N/A 04/30/2013   Procedure: Attempted LAPAROSCOPIC CHOLECYSTECTOMY ; converted to open procedure @ 1510;  Surgeon: Marlane Hatcher, MD;  Location: [facility];  Service: General;  Laterality: N/A;   CHOLECYSTECTOMY N/A 04/30/2013   Procedure: CHOLECYSTECTOMY;  Surgeon: Marlane Hatcher, MD;  Location: [facility];  Service: General;  Laterality: N/A;  procedure started @ 1520   COLONOSCOPY  06/11/2004   External hemorrhoids, few scattered left-sided diverticula, otherwise normal.  Repeat colonoscopy in 10 years.   COLONOSCOPY N/A 01/23/2020   Procedure: COLONOSCOPY;  Surgeon: Corbin Ade, MD;  Multiple small largemouth diverticula in the sigmoid and descending colon,  otherwise normal exam s/p segmental biopsies of right and left colon for histologic study.  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   ESOPHAGOGASTRODUODENOSCOPY  06/11/2004   Distal esophageal erosions consistent with mild erosive reflux esophagitis, heaped up adenomatous appearing prepyloric antral mucosa biopsy, single antral prepyloric ulcer biopsied, normal D1 and D2.   ESOPHAGOGASTRODUODENOSCOPY  10/07/2004   Distal esophageal erosions consistent with erosive reflux esophagitis, otherwise normal esophagus, normal stomach, previously noted ulcer healed completely, normal D1 and D2.   LAMINECTOMY  12/26/2017   spine surgery Dr Channing Mutters   TUBAL LIGATION      Current Outpatient Medications  Medication Sig Dispense Refill   acetaminophen (TYLENOL) 500 MG tablet Take 1,000 mg by mouth every 6 (six) hours as needed.     albuterol (PROVENTIL HFA;VENTOLIN HFA) 108 (90 BASE) MCG/ACT inhaler Inhale 2 puffs into the lungs every 6 (six) hours as needed for wheezing or shortness of breath.     alendronate (FOSAMAX) 70 MG tablet Take 70 mg by mouth once a week.     ALPRAZolam (XANAX) 0.5 MG tablet Take 0.25 mg by mouth daily as needed for anxiety or sleep.     Ascorbic Acid (VITAMIN C) 1000 MG tablet Take 1,000 mg by mouth daily.     benazepril (LOTENSIN) 10 MG tablet Take 10 mg by mouth at bedtime.      budesonide (ENTOCORT EC) 3 MG 24 hr capsule Take 3 capsules (9 mg total) by mouth daily for 8 weeks, then decrease to 2 capsules (6 mg total) by mouth daily x 2 weeks, then decrease to 1 capsule (3 mg total) by mouth daily. 180 capsule 0   cholecalciferol (VITAMIN D3) 25 MCG (1000 UNIT) tablet Take 1,000 Units by mouth daily.     DULoxetine (CYMBALTA) 30 MG capsule Take 30 mg by mouth daily.     hydrochlorothiazide (MICROZIDE) 12.5 MG capsule Take 12.5 mg by mouth daily.      HYDROcodone-acetaminophen (NORCO) 10-325 MG tablet TAKE 1 TABLET UP TO 4HTIMES DAILY.     levothyroxine  (SYNTHROID, LEVOTHROID) 88 MCG tablet Take 88 mcg by mouth daily before breakfast.     loperamide (IMODIUM) 2 MG capsule Take 2-4 mg by mouth as needed for diarrhea or loose stools.     MAGNESIUM PO Take 1 tablet by mouth daily.     MELATONIN PO Take 3 mg by mouth at bedtime.     Multiple Vitamins-Minerals (PRESERVISION AREDS 2) CAPS Take by mouth.     potassium chloride SA (KLOR-CON M) 20 MEQ tablet Take by mouth.     pregabalin (LYRICA) 50 MG capsule Take 50 mg by mouth 3 (three) times daily.     UNABLE TO FIND Med Name: hair, skin, and nail supplement.     No current facility-administered medications for this visit.   Facility-Administered Medications Ordered in Other Visits  Medication Dose Route Frequency Provider Last Rate Last Admin   fentaNYL (SUBLIMAZE) injection 25-50 mcg  25-50 mcg Intravenous Q5 min PRN Laurene Footman, MD  Allergies as of 07/27/2023 - Review Complete 07/27/2023  Allergen Reaction Noted   Codeine Nausea And Vomiting 04/26/2013   Zetia [ezetimibe] Diarrhea and Itching 06/20/2023    Family History  Problem Relation Age of Onset   Heart failure Mother    Cancer Father    Colon cancer Neg Hx    Inflammatory bowel disease Neg Hx     Social History   Socioeconomic History   Marital status: Married    Spouse name: Not on file   Number of children: Not on file   Years of education: Not on file   Highest education level: Not on file  Occupational History   Not on file  Tobacco Use   Smoking status: Every Day    Current packs/day: 0.25    Average packs/day: 0.3 packs/day for 55.0 years (13.8 ttl pk-yrs)    Types: Cigarettes   Smokeless tobacco: Never   Tobacco comments:    5-6 cigs/day  Vaping Use   Vaping status: Never Used  Substance and Sexual Activity   Alcohol use: No   Drug use: No   Sexual activity: Yes    Birth control/protection: Surgical  Other Topics Concern   Not on file  Social History Narrative   Not on file   Social  Determinants of Health   Financial Resource Strain: Not on file  Food Insecurity: Not on file  Transportation Needs: Not on file  Physical Activity: Not on file  Stress: Not on file  Social Connections: Not on file    Review of Systems: Gen: Denies fever, chills, cold or flulike symptoms, presyncope, syncope. GI: See HPI Heme: See HPI  Physical Exam: BP 136/74 (BP Location: Right Arm, Patient Position: Sitting, Cuff Size: Normal)   Pulse 76   Temp 97.8 F (36.6 C) (Temporal)   Ht 5\' 5"  (1.651 m)   Wt 142 lb 9.6 oz (64.7 kg)   BMI 23.73 kg/m  General:   Alert and oriented. No distress noted. Pleasant and cooperative.  Head:  Normocephalic and atraumatic. Eyes:  Conjuctiva clear without scleral icterus. Heart:  S1, S2 present without murmurs appreciated. Lungs:  Clear to auscultation bilaterally. No wheezes, rales, or rhonchi. No distress.  Abdomen:  +BS, soft, non-tender and non-distended. No rebound or guarding. No HSM or masses noted. Msk:  Symmetrical without gross deformities. Normal posture. Extremities:  Without edema. Neurologic:  Alert and  oriented x4 Psych:  Normal mood and affect.    Assessment:  82 year old female presenting today for follow-up of collagenous colitis, initially diagnosed in May 2021.  She responds very well to budesonide, but has never been able to taper off completely without return of diarrhea after a few months.  We have tried several medications including Imodium, Colestid, dicyclomine prior to diagnosis of collagenous colitis without improvement.  Ultimately, she had been maintaining on lowest effective dose of budesonide since February 2023 with budesonide 3 mg every 3 days.  Attempted discontinuing budesonide after 1 year of therapy in February 2024, but had recurrent diarrhea.  She was restarted on dicyclomine 9 mg daily on 04/25/2023.  She has tapered down to 3 mg daily which was started within the last week and continues to do well.  Currently  having 1 bowel movement daily.  No alarm symptoms.  Advised to continue budesonide 3 mg daily for the next 2 weeks.  If she continues to do well, decrease budesonide to every second day for 2 weeks.  If no recurrent diarrhea, may try  decreasing budesonide to every third day.   Plan:  Continue budesonide 3 mg daily x 2 weeks.  If she continues to do well, decrease to 3 mg every 2 days x 2 weeks, then decrease to 3 mg every 3 days. Requested she let me know if she develops any recurrent diarrhea. Follow-up in 3 months.   Ermalinda Memos, PA-C [facility] 07/27/2023

## 2023-07-27 ENCOUNTER — Encounter: Payer: Self-pay | Admitting: Gastroenterology

## 2023-07-27 ENCOUNTER — Ambulatory Visit (INDEPENDENT_AMBULATORY_CARE_PROVIDER_SITE_OTHER): Payer: Medicare Other | Admitting: Gastroenterology

## 2023-07-27 VITALS — BP 136/74 | HR 76 | Temp 97.8°F | Ht 65.0 in | Wt 142.6 lb

## 2023-07-27 DIAGNOSIS — K52831 Collagenous colitis: Secondary | ICD-10-CM

## 2023-07-27 NOTE — Patient Instructions (Signed)
Continue taking budesonide 3 mg daily for the next 2 weeks.  If you continue to do well, decrease budesonide to every other day.  If you are still doing well with budesonide 3 mg every other day after 2 weeks, you can decrease further to every third day.   I will send in a refill of budesonide to [facility].  I will plan to see back in the office in 3 months, but do not hesitate to call prior to your next office visit if you have any recurrent diarrhea.  It was great to see you again today!  Ermalinda Memos, PA-C [facility]

## 2023-10-26 ENCOUNTER — Ambulatory Visit (INDEPENDENT_AMBULATORY_CARE_PROVIDER_SITE_OTHER): Payer: Medicare Other | Admitting: Gastroenterology

## 2023-10-26 ENCOUNTER — Encounter: Payer: Self-pay | Admitting: Gastroenterology

## 2023-10-26 VITALS — BP 135/55 | HR 68 | Temp 97.6°F | Ht 65.0 in | Wt 141.2 lb

## 2023-10-26 DIAGNOSIS — K52831 Collagenous colitis: Secondary | ICD-10-CM

## 2023-10-26 NOTE — Progress Notes (Signed)
 Referring Provider: Nemiah Kemps, MD Primary Care Physician:  Nemiah Kemps, MD Primary GI Physician: Dr. Shaaron  Chief Complaint  Patient presents with   Follow-up    Follow up. Still having problems with diarrhea.     HPI:   [name] is a 83 y.o. female presenting today for follow-up of collagenous colitis.  Collagenous colitis was diagnosed in May 2021.  She has been treated with budesonide , but has never been able to taper off completely without return of diarrhea after a few months. We had tried several medications including Imodium, Colestid , dicyclomine  prior to diagnosis of collagenous colitis without improvement. Ultimately, we had been maintaining on the lowest effective dose of budesonide  since February 2023, and she had been doing well with budesonide  3 mg every 3 days.  Tried discontinuing budesonide  after 1 year of therapy in February 2024, but she had recurrent diarrhea and was restarted on dicyclomine  9 mg daily on 04/25/23 with plans to treat for 8 weeks followed by a taper of 6 mg daily x 2 weeks then 3 mg daily thereafter.   Budesonide  taper was prolonged due to some recurrent diarrhea with starting Zetia  in October.  Last seen in the office 07/27/2023.  She was doing well.  She is having 1 bowel movement daily taking budesonide  3 mg daily.  She is advised to continue her current dose of budesonide  for 2 weeks, then decrease to every 2 days for 2 weeks, then every 3 days.  Today: Was doing well after I saw her.  Decrease budesonide  to 3 mg every other day.  Unfortunately, diarrhea resolved returned in the latter part of December or early January.  Denies antibiotics prior to onset.  She did start atorvastatin  in December.  When diarrhea return, she increase budesonide  back to 3 mg every day.  Last Wednesday, she had terrible diarrhea.  She did take some Imodium, but this did not seem to help much.  Ultimately, diarrhea tapered back off to 2 watery bowel movements  per day which is where she had been since resuming budesonide  3 mg daily.  No nocturnal stools, brbpr, melena, abdominal pain.  Eating well. Weight stable.   Past Medical History:  Diagnosis Date   Arthritis    Carotid artery disease (HCC)    Collagenous colitis 01/2020   COPD (chronic obstructive pulmonary disease) (HCC)    Dysrhythmia    palpitations   GERD (gastroesophageal reflux disease)    Hypercholesteremia    Hypertension    Hypothyroidism    Shortness of breath     Past Surgical History:  Procedure Laterality Date   ABDOMINAL HYSTERECTOMY     APPENDECTOMY     BACK SURGERY  2019   BIOPSY  01/23/2020   Procedure: BIOPSY;  Surgeon: Shaaron Lamar CHRISTELLA, MD;  [facility];  Service: Endoscopy;;  ascending, desending colon, sigmoid   BREAST BIOPSY Left 05/04/2017   Procedure: BREAST BIOPSY WITH NEEDLE LOCALIZATION;  Surgeon: Mavis Anes, MD;  Location: [facility];  Service: General;  Laterality: Left;  needle loc at 8:00   BREAST EXCISIONAL BIOPSY Left 2018   FIBROCYSTIC CHANGES   CATARACT EXTRACTION W/PHACO Left 09/30/2014   Procedure: CATARACT EXTRACTION PHACO AND INTRAOCULAR LENS PLACEMENT LEFT EYE;  Surgeon: Cherene Mania, MD;  Location: [facility];  Service: Ophthalmology;  Laterality: Left;  CDE:8.79   CATARACT EXTRACTION W/PHACO Right 10/17/2014   Procedure: CATARACT EXTRACTION PHACO AND INTRAOCULAR LENS PLACEMENT RIGHT EYE;  Surgeon: Cherene Mania, MD;  Location: [facility];  Service: Ophthalmology;  Laterality: Right;  CDE: 7.80   CHOLECYSTECTOMY N/A 04/30/2013   Procedure: Attempted LAPAROSCOPIC CHOLECYSTECTOMY ; converted to open procedure @ 1510;  Surgeon: Elsie GORMAN Holland, MD;  Location: [facility];  Service: General;  Laterality: N/A;   CHOLECYSTECTOMY N/A 04/30/2013   Procedure: CHOLECYSTECTOMY;  Surgeon: Elsie GORMAN Holland, MD;  Location: [facility];  Service: General;  Laterality: N/A;  procedure started @ 1520   COLONOSCOPY  06/11/2004   External hemorrhoids, few  scattered left-sided diverticula, otherwise normal.  Repeat colonoscopy in 10 years.   COLONOSCOPY N/A 01/23/2020   Procedure: COLONOSCOPY;  Surgeon: Shaaron Lamar HERO, MD;  Multiple small largemouth diverticula in the sigmoid and descending colon, otherwise normal exam s/p segmental biopsies of right and left colon for histologic study.  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   ESOPHAGOGASTRODUODENOSCOPY  06/11/2004   Distal esophageal erosions consistent with mild erosive reflux esophagitis, heaped up adenomatous appearing prepyloric antral mucosa biopsy, single antral prepyloric ulcer biopsied, normal D1 and D2.   ESOPHAGOGASTRODUODENOSCOPY  10/07/2004   Distal esophageal erosions consistent with erosive reflux esophagitis, otherwise normal esophagus, normal stomach, previously noted ulcer healed completely, normal D1 and D2.   LAMINECTOMY  12/26/2017   spine surgery Dr Gaither   TUBAL LIGATION      Current Outpatient Medications  Medication Sig Dispense Refill   acetaminophen  (TYLENOL ) 500 MG tablet Take 1,000 mg by mouth every 6 (six) hours as needed.     albuterol  (PROVENTIL  HFA;VENTOLIN  HFA) 108 (90 BASE) MCG/ACT inhaler Inhale 2 puffs into the lungs every 6 (six) hours as needed for wheezing or shortness of breath.     alendronate (FOSAMAX) 70 MG tablet Take 70 mg by mouth once a week.     ALPRAZolam (XANAX) 0.5 MG tablet Take 0.25 mg by mouth daily as needed for anxiety or sleep.     Ascorbic Acid (VITAMIN C) 1000 MG tablet Take 1,000 mg by mouth daily.     atorvastatin  (LIPITOR) 10 MG tablet Take 10 mg by mouth daily.     benazepril  (LOTENSIN ) 10 MG tablet Take 10 mg by mouth at bedtime.      budesonide  (ENTOCORT EC ) 3 MG 24 hr capsule Take 3 capsules (9 mg total) by mouth daily for 8 weeks, then decrease to 2 capsules (6 mg total) by mouth daily x 2 weeks, then decrease to 1 capsule (3 mg total) by mouth daily. 180 capsule 0   cholecalciferol (VITAMIN D3) 25 MCG (1000  UNIT) tablet Take 1,000 Units by mouth daily.     DULoxetine (CYMBALTA) 30 MG capsule Take 30 mg by mouth daily.     hydrochlorothiazide (MICROZIDE) 12.5 MG capsule Take 12.5 mg by mouth daily.      HYDROcodone -acetaminophen  (NORCO) 10-325 MG tablet TAKE 1 TABLET UP TO 4HTIMES DAILY.     levothyroxine  (SYNTHROID , LEVOTHROID) 88 MCG tablet Take 88 mcg by mouth daily before breakfast.     loperamide (IMODIUM) 2 MG capsule Take 2-4 mg by mouth as needed for diarrhea or loose stools.     MAGNESIUM PO Take 1 tablet by mouth daily.     MELATONIN PO Take 3 mg by mouth at bedtime.     Multiple Vitamins-Minerals (PRESERVISION AREDS 2) CAPS Take by mouth.     potassium chloride  SA (KLOR-CON  M) 20 MEQ tablet Take by mouth.     pregabalin (LYRICA) 50 MG capsule Take 50 mg by mouth 3 (three) times daily.  UNABLE TO FIND Med Name: hair, skin, and nail supplement.     No current facility-administered medications for this visit.   Facility-Administered Medications Ordered in Other Visits  Medication Dose Route Frequency Provider Last Rate Last Admin   fentaNYL  (SUBLIMAZE ) injection 25-50 mcg  25-50 mcg Intravenous Q5 min PRN Tamea Ford, MD        Allergies as of 10/26/2023 - Review Complete 10/26/2023  Allergen Reaction Noted   Codeine Nausea And Vomiting 04/26/2013   Zetia  [ezetimibe ] Diarrhea and Itching 06/20/2023    Family History  Problem Relation Age of Onset   Heart failure Mother    Cancer Father    Colon cancer Neg Hx    Inflammatory bowel disease Neg Hx     Social History   Socioeconomic History   Marital status: Married    Spouse name: Not on file   Number of children: Not on file   Years of education: Not on file   Highest education level: Not on file  Occupational History   Not on file  Tobacco Use   Smoking status: Every Day    Current packs/day: 0.25    Average packs/day: 0.3 packs/day for 55.0 years (13.8 ttl pk-yrs)    Types: Cigarettes   Smokeless tobacco:  Never   Tobacco comments:    5-6 cigs/day  Vaping Use   Vaping status: Never Used  Substance and Sexual Activity   Alcohol use: No   Drug use: No   Sexual activity: Yes    Birth control/protection: Surgical  Other Topics Concern   Not on file  Social History Narrative   Not on file   Social Drivers of Health   Financial Resource Strain: Not on file  Food Insecurity: Not on file  Transportation Needs: Not on file  Physical Activity: Not on file  Stress: Not on file  Social Connections: Not on file    Review of Systems: Gen: Denies fever, chills, cold or flu like symptoms, pre-syncope, or syncope.   GI: See HPI Heme: See HPI  Physical Exam: BP (!) 135/55 (BP Location: Right Arm, Patient Position: Sitting, Cuff Size: Large)   Pulse 68   Temp 97.6 F (36.4 C) (Temporal)   Ht 5' 5 (1.651 m)   Wt 141 lb 3.2 oz (64 kg)   BMI 23.50 kg/m  General:   Alert and oriented. No distress noted. Pleasant and cooperative.  Head:  Normocephalic and atraumatic. Eyes:  Conjuctiva clear without scleral icterus. Abdomen:  +BS, soft, non-tender and non-distended. No rebound or guarding. No HSM or masses noted. Msk:  Symmetrical without gross deformities. Normal posture. Neurologic:  Alert and  oriented x4 Psych:  Normal mood and affect.    Assessment:  89- year-old female presenting today for follow-up of collagenous colitis, initially diagnosed in May 2021. She responds very well to budesonide , but has never been able to taper off completely without return of diarrhea after a few months. We have tried several medications including Imodium, Colestid , dicyclomine  prior to diagnosis of collagenous colitis without improvement. Ultimately, she had been maintaining on lowest effective dose of budesonide  since February 2023 with budesonide  3 mg every 3 days. Attempted discontinuing budesonide  after 1 year of therapy in February 2024, but had recurrent diarrhea. She was restarted on dicyclomine  9  mg daily on 04/25/2023 and tapered down to 3 mg every other day and was doing well until late December with diarrhea resumed. Notably, atorvastatin  was started in December. Denies recent antibiotics and has  no alarm symptoms. She resumed budesonide  3 mg daily but is still having 2 watery BMs daily.    Plan:  Crease budesonide  to 6 mg daily. Follow-up in 4 weeks.   Josette Centers, PA-C [facility] 10/26/2023

## 2023-10-26 NOTE — Patient Instructions (Signed)
 Increase budesonide  to 6 mg daily.  I will plan to see back in the office in 4 weeks or sooner if needed.  Please let me know if your diarrhea worsens between now and your next appointment.   Shana Daring, PA-C [facility]

## 2023-11-20 NOTE — Progress Notes (Unsigned)
 Referring Provider: Gareth Morgan, MD Primary Care Physician:  Gareth Morgan, MD Primary GI Physician: Dr. Jena Gauss  Chief Complaint  Patient presents with   Follow-up    Patient here today for a follow up visit on Collagenous Colitis. Patient denies any current issues. She is still taking Budesonide 6 mg daily. She feels this is working for her. Patient has not had any recent issues with diarrhea, has not seen in bright red blood in stools, nor dark stools, no abdominal pain.     HPI:   [name] is a 83 y.o. female presenting today for follow-up of collagenous colitis.   Collagenous colitis was diagnosed in May 2021.  She has been treated with budesonide, but has never been able to taper off completely without return of diarrhea after a few months. We had tried several medications including Imodium, Colestid, dicyclomine prior to diagnosis of collagenous colitis without improvement. Ultimately, we had been maintaining on the lowest effective dose of budesonide since February 2023, and she had been doing well with budesonide 3 mg every 3 days.  Tried discontinuing budesonide after 1 year of therapy in February 2024, but she had recurrent diarrhea and was restarted on dicyclomine 9 mg daily on 04/25/23 with plans to treat for 8 weeks followed by a taper of 6 mg daily x 2 weeks then 3 mg daily thereafter.    Budesonide taper was prolonged due to some recurrent diarrhea with starting Zetia in October 2024.   On 07/27/2023, she was doing well. Having 1 bowel movement daily taking budesonide 3 mg daily.  She is advised to continue her current dose of budesonide for 2 weeks, then decrease to every 2 days for 2 weeks, then every 3 days.  Last office visit 10/26/2023.  Reported doing well after I saw her in November, but when decreasing budesonide to 3 mg every other day, diarrhea returned.  This was the latter part of December/early January.  Denied recent antibiotics, but did start atorvastatin in  December.  She resumed taking budesonide 3 mg daily and was having about 2 watery bowel movements per day.  Weight was stable.  Denied nocturnal bowel movements, BRBPR, melena, abdominal pain.  Recommended increasing budesonide to 6 mg daily and following up in 4 weeks.  Today: Took a couple weeks before diarrhea resolved, but now having 1, normal bowel movement daily.  She is still taking budesonide 6 mg daily.  No abdominal pain, BRBPR, melena.  Weight is stable.    Past Medical History:  Diagnosis Date   Arthritis    Carotid artery disease (HCC)    Collagenous colitis 01/2020   COPD (chronic obstructive pulmonary disease) (HCC)    Dysrhythmia    palpitations   GERD (gastroesophageal reflux disease)    Hypercholesteremia    Hypertension    Hypothyroidism    Shortness of breath     Past Surgical History:  Procedure Laterality Date   ABDOMINAL HYSTERECTOMY     APPENDECTOMY     BACK SURGERY  2019   BIOPSY  01/23/2020   Procedure: BIOPSY;  Surgeon: Corbin Ade, MD;  [facility];  Service: Endoscopy;;  ascending, desending colon, sigmoid   BREAST BIOPSY Left 05/04/2017   Procedure: BREAST BIOPSY WITH NEEDLE LOCALIZATION;  Surgeon: Franky Macho, MD;  Location: [facility];  Service: General;  Laterality: Left;  needle loc at 8:00   BREAST EXCISIONAL BIOPSY Left 2018   FIBROCYSTIC CHANGES   CATARACT EXTRACTION W/PHACO Left  09/30/2014   Procedure: CATARACT EXTRACTION PHACO AND INTRAOCULAR LENS PLACEMENT LEFT EYE;  Surgeon: Gemma Payor, MD;  Location: [facility];  Service: Ophthalmology;  Laterality: Left;  CDE:8.79   CATARACT EXTRACTION W/PHACO Right 10/17/2014   Procedure: CATARACT EXTRACTION PHACO AND INTRAOCULAR LENS PLACEMENT RIGHT EYE;  Surgeon: Gemma Payor, MD;  Location: [facility];  Service: Ophthalmology;  Laterality: Right;  CDE: 7.80   CHOLECYSTECTOMY N/A 04/30/2013   Procedure: Attempted LAPAROSCOPIC CHOLECYSTECTOMY ; converted to open procedure @ 1510;  Surgeon: Marlane Hatcher, MD;  Location: [facility];  Service: General;  Laterality: N/A;   CHOLECYSTECTOMY N/A 04/30/2013   Procedure: CHOLECYSTECTOMY;  Surgeon: Marlane Hatcher, MD;  Location: [facility];  Service: General;  Laterality: N/A;  procedure started @ 1520   COLONOSCOPY  06/11/2004   External hemorrhoids, few scattered left-sided diverticula, otherwise normal.  Repeat colonoscopy in 10 years.   COLONOSCOPY N/A 01/23/2020   Procedure: COLONOSCOPY;  Surgeon: Corbin Ade, MD;  Multiple small largemouth diverticula in the sigmoid and descending colon, otherwise normal exam s/p segmental biopsies of right and left colon for histologic study.  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   ESOPHAGOGASTRODUODENOSCOPY  06/11/2004   Distal esophageal erosions consistent with mild erosive reflux esophagitis, heaped up adenomatous appearing prepyloric antral mucosa biopsy, single antral prepyloric ulcer biopsied, normal D1 and D2.   ESOPHAGOGASTRODUODENOSCOPY  10/07/2004   Distal esophageal erosions consistent with erosive reflux esophagitis, otherwise normal esophagus, normal stomach, previously noted ulcer healed completely, normal D1 and D2.   LAMINECTOMY  12/26/2017   spine surgery Dr Channing Mutters   TUBAL LIGATION      Current Outpatient Medications  Medication Sig Dispense Refill   acetaminophen (TYLENOL) 500 MG tablet Take 1,000 mg by mouth every 6 (six) hours as needed.     albuterol (PROVENTIL HFA;VENTOLIN HFA) 108 (90 BASE) MCG/ACT inhaler Inhale 2 puffs into the lungs every 6 (six) hours as needed for wheezing or shortness of breath.     alendronate (FOSAMAX) 70 MG tablet Take 70 mg by mouth once a week.     ALPRAZolam (XANAX) 0.5 MG tablet Take 0.25 mg by mouth daily as needed for anxiety or sleep.     Ascorbic Acid (VITAMIN C) 1000 MG tablet Take 1,000 mg by mouth daily.     atorvastatin (LIPITOR) 10 MG tablet Take 10 mg by mouth daily.     benazepril (LOTENSIN) 10 MG tablet Take 10 mg  by mouth at bedtime.      budesonide (ENTOCORT EC) 3 MG 24 hr capsule Take 3 capsules (9 mg total) by mouth daily for 8 weeks, then decrease to 2 capsules (6 mg total) by mouth daily x 2 weeks, then decrease to 1 capsule (3 mg total) by mouth daily. 180 capsule 0   cholecalciferol (VITAMIN D3) 25 MCG (1000 UNIT) tablet Take 1,000 Units by mouth daily.     DULoxetine (CYMBALTA) 30 MG capsule Take 30 mg by mouth daily.     hydrochlorothiazide (MICROZIDE) 12.5 MG capsule Take 12.5 mg by mouth daily.      HYDROcodone-acetaminophen (NORCO) 10-325 MG tablet TAKE 1 TABLET UP TO 4HTIMES DAILY.     levothyroxine (SYNTHROID, LEVOTHROID) 88 MCG tablet Take 88 mcg by mouth daily before breakfast.     loperamide (IMODIUM) 2 MG capsule Take 2-4 mg by mouth as needed for diarrhea or loose stools.     MAGNESIUM PO Take 1 tablet by mouth daily.     MELATONIN PO  Take 3 mg by mouth at bedtime.     Multiple Vitamins-Minerals (PRESERVISION AREDS 2) CAPS Take by mouth. 2 daily.     potassium chloride SA (KLOR-CON M) 20 MEQ tablet Take by mouth daily.     pregabalin (LYRICA) 50 MG capsule Take 50 mg by mouth 3 (three) times daily.     UNABLE TO FIND daily at 6 (six) AM. Med Name: hair, skin, and nail supplement.     No current facility-administered medications for this visit.   Facility-Administered Medications Ordered in Other Visits  Medication Dose Route Frequency Provider Last Rate Last Admin   fentaNYL (SUBLIMAZE) injection 25-50 mcg  25-50 mcg Intravenous Q5 min PRN Laurene Footman, MD        Allergies as of 11/23/2023 - Review Complete 11/23/2023  Allergen Reaction Noted   Codeine Nausea And Vomiting 04/26/2013   Zetia [ezetimibe] Diarrhea and Itching 06/20/2023    Family History  Problem Relation Age of Onset   Heart failure Mother    Cancer Father    Colon cancer Neg Hx    Inflammatory bowel disease Neg Hx     Social History   Socioeconomic History   Marital status: Married    Spouse name:  Not on file   Number of children: Not on file   Years of education: Not on file   Highest education level: Not on file  Occupational History   Not on file  Tobacco Use   Smoking status: Every Day    Current packs/day: 0.25    Average packs/day: 0.3 packs/day for 55.0 years (13.8 ttl pk-yrs)    Types: Cigarettes   Smokeless tobacco: Never   Tobacco comments:    5-6 cigs/day  Vaping Use   Vaping status: Never Used  Substance and Sexual Activity   Alcohol use: No   Drug use: No   Sexual activity: Yes    Birth control/protection: Surgical  Other Topics Concern   Not on file  Social History Narrative   Not on file   Social Drivers of Health   Financial Resource Strain: Not on file  Food Insecurity: Not on file  Transportation Needs: Not on file  Physical Activity: Not on file  Stress: Not on file  Social Connections: Not on file    Review of Systems: Gen: Denies fever, chills, cold or flulike symptoms, presyncope, syncope. GI: See HPI Heme: See HPI  Physical Exam: BP (!) 147/62 (BP Location: Left Arm, Patient Position: Sitting, Cuff Size: Small)   Pulse 65   Temp (!) 97.4 F (36.3 C) (Temporal)   Ht 5\' 5"  (1.651 m)   Wt 142 lb 9.6 oz (64.7 kg)   BMI 23.73 kg/m  General:   Alert and oriented. No distress noted. Pleasant and cooperative.  Head:  Normocephalic and atraumatic. Eyes:  Conjuctiva clear without scleral icterus. Abdomen:  +BS, soft, non-tender and non-distended. No rebound or guarding. No HSM or masses noted. Msk:  Symmetrical without gross deformities. Normal posture. Extremities:  Without edema. Neurologic:  Alert and  oriented x4 Psych:  Normal mood and affect.    Assessment:  83 year old female with history of collagenous colitis, presenting today for follow-up.  Initially diagnosed in May 2021.  She has been treated with budesonide, but never able to completely taper off without recurrent symptoms after a few months.  She has tried and failed  Imodium, Colestid, dicyclomine previously.  We have been trying to maintain on the lowest effective dose of budesonide since February 2023.  Did well for a while on budesonide 3 mg every 3 days.  After 1 year, try discontinuing budesonide completely, but had recurrent diarrhea and resumed budesonide in August 2024. Had done well with taper until reaching 3 mg every other day and developed recurrent diarrhea.  Currently on budesonide 6 mg daily for the last 4 weeks and diarrhea has resolved, having 1 normal bowel movement per day.  She will continue this for 4 additional weeks, then decrease to 3 mg daily.  Unfortunately, patient does continue to smoke daily.  She has been smoking for over 50 years and this is unlikely to change despite my counseling on several occasions.  I suspect this may be contributing to her inability to completely taper off of budesonide.  Plan:  Continue budesonide 6 mg daily for 4 weeks, then decrease to 3 mg daily. Follow-up in 8 weeks.  Patient will notify me of any recurrent diarrhea prior to her next follow-up.   Ermalinda Memos, PA-C [facility] 11/23/2023

## 2023-11-23 ENCOUNTER — Encounter: Payer: Self-pay | Admitting: Gastroenterology

## 2023-11-23 ENCOUNTER — Ambulatory Visit (INDEPENDENT_AMBULATORY_CARE_PROVIDER_SITE_OTHER): Payer: Medicare Other | Admitting: Gastroenterology

## 2023-11-23 VITALS — BP 147/62 | HR 65 | Temp 97.4°F | Ht 65.0 in | Wt 142.6 lb

## 2023-11-23 DIAGNOSIS — K52831 Collagenous colitis: Secondary | ICD-10-CM | POA: Diagnosis not present

## 2023-11-23 DIAGNOSIS — F17210 Nicotine dependence, cigarettes, uncomplicated: Secondary | ICD-10-CM

## 2023-11-23 NOTE — Patient Instructions (Addendum)
 Continue budesonide 6 mg daily for 4 more weeks, then decrease to 3 mg daily.  Please let me know if you have any recurrent diarrhea after decreasing to 3 mg daily.  I will plan to see you back in the office in 8 weeks or sooner if needed.    Ermalinda Memos, PA-C [facility]

## 2023-12-25 ENCOUNTER — Encounter: Payer: Self-pay | Admitting: Cardiovascular Disease

## 2024-01-17 NOTE — Progress Notes (Unsigned)
 Referring Provider: Darliss Ek, MD Primary Care Physician:  Darliss Ek, MD Primary GI Physician: Dr. Riley Cheadle  No chief complaint on file.   HPI:   [name] is a 84 y.o. female presenting today for follow-up of collagenous colitis.   Collagenous colitis was diagnosed in May 2021.  She has been treated with budesonide , but has never been able to taper off completely without return of diarrhea after a few months. We had tried several medications including Imodium, Colestid , dicyclomine  prior to diagnosis of collagenous colitis without improvement. Ultimately, we had been maintaining on the lowest effective dose of budesonide  since February 2023, and she had been doing well with budesonide  3 mg every 3 days.  Tried discontinuing budesonide  after 1 year of therapy in February 2024, but she had recurrent diarrhea and was restarted on dicyclomine  9 mg daily on 04/25/23 with plans to treat for 8 weeks followed by a taper of 6 mg daily x 2 weeks then 3 mg daily thereafter.    Budesonide  taper was prolonged due to some recurrent diarrhea with starting Zetia  in October 2024.   On 07/27/2023, she was doing well. Having 1 bowel movement daily taking budesonide  3 mg daily.  She is advised to continue her current dose of budesonide  for 2 weeks, then decrease to every 2 days for 2 weeks, then every 3 days.  Had recurrent diarrhea after decreasing budesonide  to 3 mg evbery other day in December 2024, but didn't present to the office until 10/26/23 and was resumed on budesonide  6 mg daily.   Last seen in the office 11/23/2023.  She is having 1 bowel movement daily with budesonide  6 mg daily.  No alarm symptoms.  She is advised to continue budesonide  6 mg daily for 4 weeks, then decrease to 3 mg daily.  Plan to follow-up in 8 weeks.   Today:   Past Medical History:  Diagnosis Date   Arthritis    Carotid artery disease (HCC)    Collagenous colitis 01/2020   COPD (chronic obstructive pulmonary  disease) (HCC)    Dysrhythmia    palpitations   GERD (gastroesophageal reflux disease)    Hypercholesteremia    Hypertension    Hypothyroidism    Shortness of breath     Past Surgical History:  Procedure Laterality Date   ABDOMINAL HYSTERECTOMY     APPENDECTOMY     BACK SURGERY  2019   BIOPSY  01/23/2020   Procedure: BIOPSY;  Surgeon: Suzette Espy, MD;  [facility];  Service: Endoscopy;;  ascending, desending colon, sigmoid   BREAST BIOPSY Left 05/04/2017   Procedure: BREAST BIOPSY WITH NEEDLE LOCALIZATION;  Surgeon: Alanda Allegra, MD;  Location: [facility];  Service: General;  Laterality: Left;  needle loc at 8:00   BREAST EXCISIONAL BIOPSY Left 2018   FIBROCYSTIC CHANGES   CATARACT EXTRACTION W/PHACO Left 09/30/2014   Procedure: CATARACT EXTRACTION PHACO AND INTRAOCULAR LENS PLACEMENT LEFT EYE;  Surgeon: Anner Kill, MD;  Location: [facility];  Service: Ophthalmology;  Laterality: Left;  CDE:8.79   CATARACT EXTRACTION W/PHACO Right 10/17/2014   Procedure: CATARACT EXTRACTION PHACO AND INTRAOCULAR LENS PLACEMENT RIGHT EYE;  Surgeon: Anner Kill, MD;  Location: [facility];  Service: Ophthalmology;  Laterality: Right;  CDE: 7.80   CHOLECYSTECTOMY N/A 04/30/2013   Procedure: Attempted LAPAROSCOPIC CHOLECYSTECTOMY ; converted to open procedure @ 1510;  Surgeon: Myrl Askew, MD;  Location: [facility];  Service: General;  Laterality: N/A;   CHOLECYSTECTOMY N/A 04/30/2013  Procedure: CHOLECYSTECTOMY;  Surgeon: Myrl Askew, MD;  Location: [facility];  Service: General;  Laterality: N/A;  procedure started @ 1520   COLONOSCOPY  06/11/2004   External hemorrhoids, few scattered left-sided diverticula, otherwise normal.  Repeat colonoscopy in 10 years.   COLONOSCOPY N/A 01/23/2020   Procedure: COLONOSCOPY;  Surgeon: Suzette Espy, MD;  Multiple small largemouth diverticula in the sigmoid and descending colon, otherwise normal exam s/p segmental biopsies of right and left colon for  histologic study.  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   ESOPHAGOGASTRODUODENOSCOPY  06/11/2004   Distal esophageal erosions consistent with mild erosive reflux esophagitis, heaped up adenomatous appearing prepyloric antral mucosa biopsy, single antral prepyloric ulcer biopsied, normal D1 and D2.   ESOPHAGOGASTRODUODENOSCOPY  10/07/2004   Distal esophageal erosions consistent with erosive reflux esophagitis, otherwise normal esophagus, normal stomach, previously noted ulcer healed completely, normal D1 and D2.   LAMINECTOMY  12/26/2017   spine surgery Dr Joanette Moynahan   TUBAL LIGATION      Current Outpatient Medications  Medication Sig Dispense Refill   acetaminophen  (TYLENOL ) 500 MG tablet Take 1,000 mg by mouth every 6 (six) hours as needed.     albuterol  (PROVENTIL  HFA;VENTOLIN  HFA) 108 (90 BASE) MCG/ACT inhaler Inhale 2 puffs into the lungs every 6 (six) hours as needed for wheezing or shortness of breath.     alendronate (FOSAMAX) 70 MG tablet Take 70 mg by mouth once a week.     ALPRAZolam (XANAX) 0.5 MG tablet Take 0.25 mg by mouth daily as needed for anxiety or sleep.     Ascorbic Acid (VITAMIN C) 1000 MG tablet Take 1,000 mg by mouth daily.     atorvastatin  (LIPITOR) 10 MG tablet Take 10 mg by mouth daily.     benazepril  (LOTENSIN ) 10 MG tablet Take 10 mg by mouth at bedtime.      budesonide  (ENTOCORT EC ) 3 MG 24 hr capsule Take 3 capsules (9 mg total) by mouth daily for 8 weeks, then decrease to 2 capsules (6 mg total) by mouth daily x 2 weeks, then decrease to 1 capsule (3 mg total) by mouth daily. 180 capsule 0   cholecalciferol (VITAMIN D3) 25 MCG (1000 UNIT) tablet Take 1,000 Units by mouth daily.     DULoxetine (CYMBALTA) 30 MG capsule Take 30 mg by mouth daily.     hydrochlorothiazide (MICROZIDE) 12.5 MG capsule Take 12.5 mg by mouth daily.      HYDROcodone -acetaminophen  (NORCO) 10-325 MG tablet TAKE 1 TABLET UP TO 4HTIMES DAILY.     levothyroxine  (SYNTHROID ,  LEVOTHROID) 88 MCG tablet Take 88 mcg by mouth daily before breakfast.     loperamide (IMODIUM) 2 MG capsule Take 2-4 mg by mouth as needed for diarrhea or loose stools.     MAGNESIUM PO Take 1 tablet by mouth daily.     MELATONIN PO Take 3 mg by mouth at bedtime.     Multiple Vitamins-Minerals (PRESERVISION AREDS 2) CAPS Take by mouth. 2 daily.     potassium chloride  SA (KLOR-CON  M) 20 MEQ tablet Take by mouth daily.     pregabalin (LYRICA) 50 MG capsule Take 50 mg by mouth 3 (three) times daily.     UNABLE TO FIND daily at 6 (six) AM. Med Name: hair, skin, and nail supplement.     No current facility-administered medications for this visit.   Facility-Administered Medications Ordered in Other Visits  Medication Dose Route Frequency Provider Last Rate Last Admin   fentaNYL  (SUBLIMAZE )  injection 25-50 mcg  25-50 mcg Intravenous Q5 min PRN Alita Apt, MD        Allergies as of 01/18/2024 - Review Complete 11/23/2023  Allergen Reaction Noted   Codeine Nausea And Vomiting 04/26/2013   Zetia  [ezetimibe ] Diarrhea and Itching 06/20/2023    Family History  Problem Relation Age of Onset   Heart failure Mother    Cancer Father    Colon cancer Neg Hx    Inflammatory bowel disease Neg Hx     Social History   Socioeconomic History   Marital status: Married    Spouse name: Not on file   Number of children: Not on file   Years of education: Not on file   Highest education level: Not on file  Occupational History   Not on file  Tobacco Use   Smoking status: Every Day    Current packs/day: 0.25    Average packs/day: 0.3 packs/day for 55.0 years (13.8 ttl pk-yrs)    Types: Cigarettes   Smokeless tobacco: Never   Tobacco comments:    5-6 cigs/day  Vaping Use   Vaping status: Never Used  Substance and Sexual Activity   Alcohol use: No   Drug use: No   Sexual activity: Yes    Birth control/protection: Surgical  Other Topics Concern   Not on file  Social History Narrative    Not on file   Social Drivers of Health   Financial Resource Strain: Not on file  Food Insecurity: Not on file  Transportation Needs: Not on file  Physical Activity: Not on file  Stress: Not on file  Social Connections: Not on file    Review of Systems: Gen: Denies fever, chills, anorexia. Denies fatigue, weakness, weight loss.  CV: Denies chest pain, palpitations, syncope, peripheral edema, and claudication. Resp: Denies dyspnea at rest, cough, wheezing, coughing up blood, and pleurisy. GI: Denies vomiting blood, jaundice, and fecal incontinence.   Denies dysphagia or odynophagia. Derm: Denies rash, itching, dry skin Psych: Denies depression, anxiety, memory loss, confusion. No homicidal or suicidal ideation.  Heme: Denies bruising, bleeding, and enlarged lymph nodes.  Physical Exam: There were no vitals taken for this visit. General:   Alert and oriented. No distress noted. Pleasant and cooperative.  Head:  Normocephalic and atraumatic. Eyes:  Conjuctiva clear without scleral icterus. Heart:  S1, S2 present without murmurs appreciated. Lungs:  Clear to auscultation bilaterally. No wheezes, rales, or rhonchi. No distress.  Abdomen:  +BS, soft, non-tender and non-distended. No rebound or guarding. No HSM or masses noted. Msk:  Symmetrical without gross deformities. Normal posture. Extremities:  Without edema. Neurologic:  Alert and  oriented x4 Psych:  Normal mood and affect.    Assessment:     Plan:  ***   Shana Daring, PA-C [facility] 01/18/2024

## 2024-01-18 ENCOUNTER — Encounter: Payer: Self-pay | Admitting: Gastroenterology

## 2024-01-18 ENCOUNTER — Ambulatory Visit (INDEPENDENT_AMBULATORY_CARE_PROVIDER_SITE_OTHER): Admitting: Gastroenterology

## 2024-01-18 VITALS — BP 137/61 | HR 85 | Temp 97.8°F | Ht 65.0 in | Wt 141.8 lb

## 2024-01-18 DIAGNOSIS — K52831 Collagenous colitis: Secondary | ICD-10-CM | POA: Diagnosis not present

## 2024-01-18 NOTE — Patient Instructions (Signed)
 Continue budesonide  3 mg daily.  I will plan to see back in 3 months or sooner if needed.  It was great to see you again today!  Shana Daring, PA-C [facility]

## 2024-03-12 ENCOUNTER — Other Ambulatory Visit (HOSPITAL_COMMUNITY): Payer: Self-pay | Admitting: Family Medicine

## 2024-03-12 DIAGNOSIS — M79604 Pain in right leg: Secondary | ICD-10-CM

## 2024-03-21 ENCOUNTER — Ambulatory Visit (HOSPITAL_COMMUNITY)
Admission: RE | Admit: 2024-03-21 | Discharge: 2024-03-21 | Disposition: A | Discharge status: Home / Self Care | Source: Ambulatory Visit | Attending: Family Medicine | Admitting: Family Medicine

## 2024-03-21 DIAGNOSIS — M79604 Pain in right leg: Secondary | ICD-10-CM | POA: Insufficient documentation

## 2024-03-21 DIAGNOSIS — M79605 Pain in left leg: Secondary | ICD-10-CM | POA: Diagnosis present

## 2024-04-09 NOTE — Progress Notes (Unsigned)
 [facility] [place]  ASSESSMENT / PLAN: [name] is a 83 y.o. female with atherosclerosis of native arteries of radius, left worse than right causing atypical symptoms.  Recommend:  Abstinence from all tobacco products. Blood glucose control with goal A1c < 7%. Blood pressure control with goal blood pressure < 130/80 mmHg. Lipid reduction therapy with goal LDL-C < 55 mg/dL. Aspirin  81mg  by mouth daily. Atorvastatin  40-80mg  PO QD (or other high intensity statin therapy).  Clinically, her pain seems more radicular to me than vascular.  She does have significant vascular disease in the right lower extremity, however.  Recommend trial of medical therapy and short interval follow-up to evaluate symptoms.  Encouraged her to discuss her symptoms with her primary care physician regarding radiculopathy.  CHIEF COMPLAINT: Leg pain  HISTORY OF PRESENT ILLNESS: [name] is a 83 y.o. female referred to clinic for evaluation of radiating discomfort in the bilateral legs.  The patient describes inner thigh discomfort radiating to the calves.  This occurs with activity and with rest.  No real provoking or alleviating features.  Symptoms are not typical of intermittent claudication. She has no pain at rest in the distal feet or metatarsals. She has no ulcers about her feet. She has a strong personal history of lower back degenerative disease and history of surgery on the lumbar spine.  Past Medical History:  Diagnosis Date   Arthritis    Carotid artery disease (HCC)    Collagenous colitis 01/2020   COPD (chronic obstructive pulmonary disease) (HCC)    Dysrhythmia    palpitations   GERD (gastroesophageal reflux disease)    Hypercholesteremia    Hypertension    Hypothyroidism    Shortness of breath     Past Surgical History:  Procedure Laterality Date   ABDOMINAL HYSTERECTOMY     APPENDECTOMY     BACK SURGERY  2019   BIOPSY  01/23/2020   Procedure: BIOPSY;   Surgeon: Shaaron Lamar CHRISTELLA, MD;  [facility];  Service: Endoscopy;;  ascending, desending colon, sigmoid   BREAST BIOPSY Left 05/04/2017   Procedure: BREAST BIOPSY WITH NEEDLE LOCALIZATION;  Surgeon: Mavis Anes, MD;  Location: [facility];  Service: General;  Laterality: Left;  needle loc at 8:00   BREAST EXCISIONAL BIOPSY Left 2018   FIBROCYSTIC CHANGES   CATARACT EXTRACTION W/PHACO Left 09/30/2014   Procedure: CATARACT EXTRACTION PHACO AND INTRAOCULAR LENS PLACEMENT LEFT EYE;  Surgeon: Cherene Mania, MD;  Location: [facility];  Service: Ophthalmology;  Laterality: Left;  CDE:8.79   CATARACT EXTRACTION W/PHACO Right 10/17/2014   Procedure: CATARACT EXTRACTION PHACO AND INTRAOCULAR LENS PLACEMENT RIGHT EYE;  Surgeon: Cherene Mania, MD;  Location: [facility];  Service: Ophthalmology;  Laterality: Right;  CDE: 7.80   CHOLECYSTECTOMY N/A 04/30/2013   Procedure: Attempted LAPAROSCOPIC CHOLECYSTECTOMY ; converted to open procedure @ 1510;  Surgeon: Elsie GORMAN Holland, MD;  Location: [facility];  Service: General;  Laterality: N/A;   CHOLECYSTECTOMY N/A 04/30/2013   Procedure: CHOLECYSTECTOMY;  Surgeon: Elsie GORMAN Holland, MD;  Location: [facility];  Service: General;  Laterality: N/A;  procedure started @ 1520   COLONOSCOPY  06/11/2004   External hemorrhoids, few scattered left-sided diverticula, otherwise normal.  Repeat colonoscopy in 10 years.   COLONOSCOPY N/A 01/23/2020   Procedure: COLONOSCOPY;  Surgeon: Shaaron Lamar CHRISTELLA, MD;  Multiple small largemouth diverticula in the sigmoid and descending colon, otherwise normal exam s/p segmental biopsies of right and left colon for histologic study.  Pathology  with findings consistent with collagenous colitis.   ESOPHAGOGASTRODUODENOSCOPY  06/11/2004   Distal esophageal erosions consistent with mild erosive reflux esophagitis, heaped up adenomatous appearing prepyloric antral mucosa biopsy, single antral prepyloric ulcer biopsied, normal D1 and D2.    ESOPHAGOGASTRODUODENOSCOPY  10/07/2004   Distal esophageal erosions consistent with erosive reflux esophagitis, otherwise normal esophagus, normal stomach, previously noted ulcer healed completely, normal D1 and D2.   LAMINECTOMY  12/26/2017   spine surgery Dr Gaither   TUBAL LIGATION      Family History  Problem Relation Age of Onset   Heart failure Mother    Cancer Father    Colon cancer Neg Hx    Inflammatory bowel disease Neg Hx     Social History   Socioeconomic History   Marital status: Married    Spouse name: Not on file   Number of children: Not on file   Years of education: Not on file   Highest education level: Not on file  Occupational History   Not on file  Tobacco Use   Smoking status: Every Day    Current packs/day: 0.25    Average packs/day: 0.3 packs/day for 55.0 years (13.8 ttl pk-yrs)    Types: Cigarettes   Smokeless tobacco: Never   Tobacco comments:    5-6 cigs/day  Vaping Use   Vaping status: Never Used  Substance and Sexual Activity   Alcohol use: No   Drug use: No   Sexual activity: Yes    Birth control/protection: Surgical  Other Topics Concern   Not on file  Social History Narrative   Not on file   Social Drivers of Health   Financial Resource Strain: Not on file  Food Insecurity: Not on file  Transportation Needs: Not on file  Physical Activity: Not on file  Stress: Not on file  Social Connections: Not on file  Intimate Partner Violence: Not on file    Allergies  Allergen Reactions   Codeine Nausea And Vomiting   Zetia  [Ezetimibe ] Diarrhea and Itching    Current Outpatient Medications  Medication Sig Dispense Refill   acetaminophen  (TYLENOL ) 500 MG tablet Take 1,000 mg by mouth every 6 (six) hours as needed.     albuterol  (PROVENTIL  HFA;VENTOLIN  HFA) 108 (90 BASE) MCG/ACT inhaler Inhale 2 puffs into the lungs every 6 (six) hours as needed for wheezing or shortness of breath.     alendronate (FOSAMAX) 70 MG tablet Take 70 mg by  mouth once a week.     ALPRAZolam (XANAX) 0.5 MG tablet Take 0.25 mg by mouth daily as needed for anxiety or sleep.     Ascorbic Acid (VITAMIN C) 1000 MG tablet Take 1,000 mg by mouth daily.     atorvastatin  (LIPITOR) 10 MG tablet Take 10 mg by mouth daily.     benazepril  (LOTENSIN ) 10 MG tablet Take 10 mg by mouth at bedtime.      budesonide  (ENTOCORT EC ) 3 MG 24 hr capsule Take 3 capsules (9 mg total) by mouth daily for 8 weeks, then decrease to 2 capsules (6 mg total) by mouth daily x 2 weeks, then decrease to 1 capsule (3 mg total) by mouth daily. 180 capsule 0   cholecalciferol (VITAMIN D3) 25 MCG (1000 UNIT) tablet Take 1,000 Units by mouth daily.     DULoxetine (CYMBALTA) 30 MG capsule Take 30 mg by mouth daily.     hydrochlorothiazide (MICROZIDE) 12.5 MG capsule Take 12.5 mg by mouth daily.      HYDROcodone -acetaminophen  (NORCO)  10-325 MG tablet TAKE 1 TABLET UP TO 4HTIMES DAILY.     levothyroxine  (SYNTHROID , LEVOTHROID) 88 MCG tablet Take 88 mcg by mouth daily before breakfast.     loperamide (IMODIUM) 2 MG capsule Take 2-4 mg by mouth as needed for diarrhea or loose stools. (Patient not taking: Reported on 01/18/2024)     MAGNESIUM PO Take 1 tablet by mouth daily.     MELATONIN PO Take 3 mg by mouth at bedtime.     Multiple Vitamins-Minerals (PRESERVISION AREDS 2) CAPS Take by mouth. 2 daily.     potassium chloride  SA (KLOR-CON  M) 20 MEQ tablet Take by mouth daily.     pregabalin (LYRICA) 50 MG capsule Take 50 mg by mouth 3 (three) times daily.     UNABLE TO FIND daily at 6 (six) AM. Med Name: hair, skin, and nail supplement.     No current facility-administered medications for this visit.   Facility-Administered Medications Ordered in Other Visits  Medication Dose Route Frequency Provider Last Rate Last Admin   fentaNYL  (SUBLIMAZE ) injection 25-50 mcg  25-50 mcg Intravenous Q5 min PRN Tamea Ford, MD        PHYSICAL EXAM There were no vitals filed for this visit.  Elderly  woman in no distress Regular rate and rhythm Unlabored breathing No palpable pedal pulses bilaterally  PERTINENT LABORATORY AND RADIOLOGIC DATA  Most recent CBC    Latest Ref Rng & Units 03/01/2022    9:19 PM 04/08/2020   11:42 AM 11/27/2019    9:26 AM  CBC  WBC 4.0 - 10.5 K/uL 5.9  9.0  7.6   Hemoglobin 12.0 - 15.0 g/dL 89.3  87.5  88.1   Hematocrit 36.0 - 46.0 % 33.6  38.3  37.4   Platelets 150 - 400 K/uL 194  345  347      Most recent CMP    Latest Ref Rng & Units 03/01/2022    9:19 PM 12/23/2020    9:37 AM 06/13/2020   12:09 PM  CMP  Glucose 70 - 99 mg/dL 857   847   BUN 8 - 23 mg/dL 26   19   Creatinine 9.55 - 1.00 mg/dL 8.78   8.96   Sodium 864 - 145 mmol/L 134   138   Potassium 3.5 - 5.1 mmol/L 4.9   5.2   Chloride 98 - 111 mmol/L 105   98   CO2 22 - 32 mmol/L 25   28   Calcium  8.9 - 10.3 mg/dL 8.4   89.7   Total Protein 6.5 - 8.1 g/dL 6.2  7.1    Total Bilirubin 0.3 - 1.2 mg/dL 0.6  0.4    Alkaline Phos 38 - 126 U/L 98  114    AST 15 - 41 U/L 31  18    ALT 0 - 44 U/L 24  20      Renal function CrCl cannot be calculated (Patient's most recent lab result is older than the maximum 21 days allowed.).  No results found for: HGBA1C  LDL Chol Calc (NIH)  Date Value Ref Range Status  12/23/2020 69 0 - 99 mg/dL Final    Carotid Duplex 06/08/23   Right Carotid: Velocities in the right ICA are consistent with a 40-59%                 stenosis. Non-hemodynamically significant plaque <50% noted  in  the CCA. The ECA appears >50% stenosed.   Left Carotid: Velocities in the left ICA are consistent with a 60-79%  stenosis.               Non-hemodynamically significant plaque <50% noted in the  CCA.   Vertebrals: Bilateral vertebral arteries demonstrate antegrade flow.  Subclavians: Bilateral subclavian artery flow was disturbed.   CLINICAL DATA:  BILATERAL lower extremity pain for 1 year with rest and walking. Current smoker. Hypertension.  Hyperlipidemia.   EXAM: NONINVASIVE PHYSIOLOGIC VASCULAR STUDY OF BILATERAL LOWER EXTREMITIES   TECHNIQUE: Evaluation of both lower extremities were performed at rest, including calculation of ankle-brachial indices with single level pressure measurements and doppler recording.   COMPARISON:  None available.   FINDINGS: Right ABI:  0.43   Left ABI:  0.94   Right Lower Extremity: Posterior tibial and dorsalis pedis artery waveforms are monophasic.   Left Lower Extremity:  Normal arterial waveforms at the ankle.   < 0.5 Severe PAD   IMPRESSION: Severe RIGHT and borderline LEFT lower extremity peripheral arterial disease.     Electronically Signed   By: Aliene Lloyd M.D.   On: 03/21/2024 15:16  Debby SAILOR. Magda, MD FACS [facility] Phone Number: [phone] 04/09/2024 4:57 PM   Total time spent on preparing this encounter including chart review, data review, collecting history, examining the patient, and coordinating care: 40 minutes.   Portions of this report may have been transcribed using voice recognition software.  Every effort has been made to ensure accuracy; however, inadvertent computerized transcription errors may still be present.

## 2024-04-10 ENCOUNTER — Ambulatory Visit (INDEPENDENT_AMBULATORY_CARE_PROVIDER_SITE_OTHER): Admitting: Vascular Surgery

## 2024-04-10 ENCOUNTER — Encounter: Payer: Self-pay | Admitting: Vascular Surgery

## 2024-04-10 VITALS — BP 182/64 | HR 66 | Ht 65.0 in | Wt 140.0 lb

## 2024-04-10 DIAGNOSIS — I739 Peripheral vascular disease, unspecified: Secondary | ICD-10-CM | POA: Diagnosis not present

## 2024-04-18 NOTE — Progress Notes (Unsigned)
 Referring Provider: Nemiah Kemps, MD Primary Care Physician:  Nemiah Kemps, MD Primary GI Physician: Dr. Shaaron  Chief Complaint  Patient presents with   Follow-up    Follow up. No problems     HPI:   [name] is a 83 y.o. female presenting today for follow-up of collagenous colitis.    Collagenous colitis was diagnosed in May 2021.  She has been treated with budesonide , but has never been able to taper off completely without return of diarrhea after a few months. We had tried several medications including Imodium, Colestid , dicyclomine  prior to diagnosis of collagenous colitis without improvement. Ultimately, we had been maintaining on the lowest effective dose of budesonide  since February 2023, and she had been doing well with budesonide  3 mg every 3 days.  Tried discontinuing budesonide  after 1 year of therapy in February 2024, but she had recurrent diarrhea and was restarted on dicyclomine  9 mg daily on 04/25/23 with plans to treat for 8 weeks followed by a taper of 6 mg daily x 2 weeks then 3 mg daily thereafter.    Budesonide  taper was prolonged due to some recurrent diarrhea with starting Zetia  in October 2024.   On 07/27/2023, she was doing well. Having 1 bowel movement daily taking budesonide  3 mg daily.  She is advised to continue her current dose of budesonide  for 2 weeks, then decrease to every 2 days for 2 weeks, then every 3 days.   Had recurrent diarrhea after decreasing budesonide  to 3 mg every other day in December 2024, but didn't present to the office until 10/26/23 and was resumed on budesonide  6 mg daily.   Last seen in the office 01/18/2024.  She was doing very well on budesonide  3 mg daily at that point.  Recommended continuing current dose and could consider tapering at next office visit.  Plan for 74-month follow-up.   Today:  Started taking budesonide  6 mg daily about 1 week ago. Was having diarrhea for about 1 month prior to this. Too many Bms to count.   Now having 1 loose BM per day. No abdominal pain, brbpr, or melena.  No weight loss.  Has not changed her diet.  Had not started any new medications, antibiotics prior to onset.  No recent travel or sick contacts.  No NSAIDs at this time.    Past Medical History:  Diagnosis Date   Arthritis    Carotid artery disease (HCC)    Collagenous colitis 01/2020   COPD (chronic obstructive pulmonary disease) (HCC)    Dysrhythmia    palpitations   GERD (gastroesophageal reflux disease)    Hypercholesteremia    Hypertension    Hypothyroidism    Shortness of breath     Past Surgical History:  Procedure Laterality Date   ABDOMINAL HYSTERECTOMY     APPENDECTOMY     BACK SURGERY  2019   BIOPSY  01/23/2020   Procedure: BIOPSY;  Surgeon: Shaaron Lamar CHRISTELLA, MD;  [facility];  Service: Endoscopy;;  ascending, desending colon, sigmoid   BREAST BIOPSY Left 05/04/2017   Procedure: BREAST BIOPSY WITH NEEDLE LOCALIZATION;  Surgeon: Mavis Anes, MD;  Location: [facility];  Service: General;  Laterality: Left;  needle loc at 8:00   BREAST EXCISIONAL BIOPSY Left 2018   FIBROCYSTIC CHANGES   CATARACT EXTRACTION W/PHACO Left 09/30/2014   Procedure: CATARACT EXTRACTION PHACO AND INTRAOCULAR LENS PLACEMENT LEFT EYE;  Surgeon: Cherene Mania, MD;  Location: [facility];  Service: Ophthalmology;  Laterality:  Left;  CDE:8.79   CATARACT EXTRACTION W/PHACO Right 10/17/2014   Procedure: CATARACT EXTRACTION PHACO AND INTRAOCULAR LENS PLACEMENT RIGHT EYE;  Surgeon: Cherene Mania, MD;  Location: [facility];  Service: Ophthalmology;  Laterality: Right;  CDE: 7.80   CHOLECYSTECTOMY N/A 04/30/2013   Procedure: Attempted LAPAROSCOPIC CHOLECYSTECTOMY ; converted to open procedure @ 1510;  Surgeon: Elsie GORMAN Holland, MD;  Location: [facility];  Service: General;  Laterality: N/A;   CHOLECYSTECTOMY N/A 04/30/2013   Procedure: CHOLECYSTECTOMY;  Surgeon: Elsie GORMAN Holland, MD;  Location: [facility];  Service: General;  Laterality: N/A;   procedure started @ 1520   COLONOSCOPY  06/11/2004   External hemorrhoids, few scattered left-sided diverticula, otherwise normal.  Repeat colonoscopy in 10 years.   COLONOSCOPY N/A 01/23/2020   Procedure: COLONOSCOPY;  Surgeon: Shaaron Lamar HERO, MD;  Multiple small largemouth diverticula in the sigmoid and descending colon, otherwise normal exam s/p segmental biopsies of right and left colon for histologic study.  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   ESOPHAGOGASTRODUODENOSCOPY  06/11/2004   Distal esophageal erosions consistent with mild erosive reflux esophagitis, heaped up adenomatous appearing prepyloric antral mucosa biopsy, single antral prepyloric ulcer biopsied, normal D1 and D2.   ESOPHAGOGASTRODUODENOSCOPY  10/07/2004   Distal esophageal erosions consistent with erosive reflux esophagitis, otherwise normal esophagus, normal stomach, previously noted ulcer healed completely, normal D1 and D2.   LAMINECTOMY  12/26/2017   spine surgery Dr Gaither   TUBAL LIGATION      Current Outpatient Medications  Medication Sig Dispense Refill   acetaminophen  (TYLENOL ) 500 MG tablet Take 1,000 mg by mouth every 6 (six) hours as needed.     albuterol  (PROVENTIL  HFA;VENTOLIN  HFA) 108 (90 BASE) MCG/ACT inhaler Inhale 2 puffs into the lungs every 6 (six) hours as needed for wheezing or shortness of breath.     alendronate (FOSAMAX) 70 MG tablet Take 70 mg by mouth once a week.     ALPRAZolam (XANAX) 0.5 MG tablet Take 0.25 mg by mouth daily as needed for anxiety or sleep.     Ascorbic Acid (VITAMIN C) 1000 MG tablet Take 1,000 mg by mouth daily.     atorvastatin  (LIPITOR) 10 MG tablet Take 10 mg by mouth daily.     benazepril  (LOTENSIN ) 10 MG tablet Take 10 mg by mouth at bedtime.      budesonide  (ENTOCORT EC ) 3 MG 24 hr capsule Take 3 capsules (9 mg total) by mouth daily for 8 weeks, then decrease to 2 capsules (6 mg total) by mouth daily x 2 weeks, then decrease to 1 capsule (3 mg  total) by mouth daily. (Patient taking differently: Take 6 mg by mouth daily. Take 3 capsules (9 mg total) by mouth daily for 8 weeks, then decrease to 2 capsules (6 mg total) by mouth daily x 2 weeks, then decrease to 1 capsule (3 mg total) by mouth daily.) 180 capsule 0   cholecalciferol (VITAMIN D3) 25 MCG (1000 UNIT) tablet Take 1,000 Units by mouth daily.     DULoxetine (CYMBALTA) 30 MG capsule Take 30 mg by mouth daily.     hydrochlorothiazide (MICROZIDE) 12.5 MG capsule Take 12.5 mg by mouth daily.      HYDROcodone -acetaminophen  (NORCO) 10-325 MG tablet TAKE 1 TABLET UP TO 4HTIMES DAILY.     levothyroxine  (SYNTHROID , LEVOTHROID) 88 MCG tablet Take 88 mcg by mouth daily before breakfast.     loperamide (IMODIUM) 2 MG capsule Take 2-4 mg by mouth as needed for diarrhea or loose  stools.     MAGNESIUM PO Take 1 tablet by mouth daily.     MELATONIN PO Take 3 mg by mouth at bedtime.     Multiple Vitamins-Minerals (PRESERVISION AREDS 2) CAPS Take by mouth. 2 daily.     potassium chloride  SA (KLOR-CON  M) 20 MEQ tablet Take by mouth daily.     pregabalin (LYRICA) 50 MG capsule Take 50 mg by mouth 3 (three) times daily.     UNABLE TO FIND daily at 6 (six) AM. Med Name: hair, skin, and nail supplement.     No current facility-administered medications for this visit.   Facility-Administered Medications Ordered in Other Visits  Medication Dose Route Frequency Provider Last Rate Last Admin   fentaNYL  (SUBLIMAZE ) injection 25-50 mcg  25-50 mcg Intravenous Q5 min PRN Tamea Ford, MD        Allergies as of 04/19/2024 - Review Complete 04/19/2024  Allergen Reaction Noted   Codeine Nausea And Vomiting 04/26/2013   Zetia  [ezetimibe ] Diarrhea and Itching 06/20/2023    Family History  Problem Relation Age of Onset   Heart failure Mother    Cancer Father    Colon cancer Neg Hx    Inflammatory bowel disease Neg Hx     Social History   Socioeconomic History   Marital status: Married    Spouse  name: Not on file   Number of children: Not on file   Years of education: Not on file   Highest education level: Not on file  Occupational History   Not on file  Tobacco Use   Smoking status: Every Day    Current packs/day: 0.25    Average packs/day: 0.3 packs/day for 55.0 years (13.8 ttl pk-yrs)    Types: Cigarettes   Smokeless tobacco: Never   Tobacco comments:    5-6 cigs/day  Vaping Use   Vaping status: Never Used  Substance and Sexual Activity   Alcohol use: No   Drug use: No   Sexual activity: Yes    Birth control/protection: Surgical  Other Topics Concern   Not on file  Social History Narrative   Not on file   Social Drivers of Health   Financial Resource Strain: Not on file  Food Insecurity: Not on file  Transportation Needs: Not on file  Physical Activity: Not on file  Stress: Not on file  Social Connections: Not on file    Review of Systems: Gen: Denies fever, chills, cold or flulike symptoms, presyncope, syncope. GI: See HPI Heme: See HPI  Physical Exam: BP (!) 139/55 (BP Location: Right Arm, Patient Position: Sitting, Cuff Size: Normal)   Pulse 74   Temp 97.8 F (36.6 C) (Temporal)   Ht 5' 5 (1.651 m)   Wt 143 lb 12.8 oz (65.2 kg)   BMI 23.93 kg/m  General:   Alert and oriented. No distress noted. Pleasant and cooperative.  Head:  Normocephalic and atraumatic. Eyes:  Conjuctiva clear without scleral icterus. Abdomen:  +BS, soft, non-tender and non-distended. No rebound or guarding. No HSM or masses noted. Msk:  Symmetrical without gross deformities. Normal posture. Extremities:  Without edema. Neurologic:  Alert and  oriented x4 Psych:  Normal mood and affect.    Assessment:  83 year old female with history of collagenous colitis, presenting today for follow-up. Initially diagnosed in May 2021. She has been treated with budesonide , but never able to completely taper off without recurrent symptoms after a few months. She has tried and failed  Imodium, Colestid , dicyclomine  previously. We have  been trying to maintain on the lowest effective dose of budesonide  since February 2023. Did well for a while on budesonide  3 mg every 3 days. After 1 year, try discontinuing budesonide  completely, but had recurrent diarrhea and resumed budesonide  in August 2024. Had done well with taper until reaching 3 mg every other day and developed recurrent diarrhea. Resumed budesonide  6 mg daily in February 2025. Was able to taper down to 3 mg, but has been back on 6 mg for the last week due to recurrent diarrhea which now again improved.    Plan:  Continue budesonide  6 mg daily for 4 weeks, then decrease to 3 mg.  Follow-up in 6 weeks or sooner if needed.    Josette Centers, PA-C [facility] 04/19/2024

## 2024-04-19 ENCOUNTER — Encounter: Payer: Self-pay | Admitting: Gastroenterology

## 2024-04-19 ENCOUNTER — Ambulatory Visit (INDEPENDENT_AMBULATORY_CARE_PROVIDER_SITE_OTHER): Admitting: Gastroenterology

## 2024-04-19 VITALS — BP 139/55 | HR 74 | Temp 97.8°F | Ht 65.0 in | Wt 143.8 lb

## 2024-04-19 DIAGNOSIS — K52831 Collagenous colitis: Secondary | ICD-10-CM

## 2024-04-19 NOTE — Patient Instructions (Addendum)
 Continue taking budesonide  6 mg daily for a full 4 weeks, then decrease to 3 mg daily.   We will follow-up with you in 6 weeks or sooner if needed.   Josette Centers, PA-C [facility]

## 2024-05-31 ENCOUNTER — Encounter: Payer: Self-pay | Admitting: Gastroenterology

## 2024-05-31 ENCOUNTER — Ambulatory Visit (INDEPENDENT_AMBULATORY_CARE_PROVIDER_SITE_OTHER): Admitting: Gastroenterology

## 2024-05-31 VITALS — BP 135/61 | HR 78 | Temp 98.5°F | Ht 65.0 in | Wt 142.6 lb

## 2024-05-31 DIAGNOSIS — K52831 Collagenous colitis: Secondary | ICD-10-CM | POA: Diagnosis not present

## 2024-05-31 DIAGNOSIS — R634 Abnormal weight loss: Secondary | ICD-10-CM

## 2024-05-31 DIAGNOSIS — K219 Gastro-esophageal reflux disease without esophagitis: Secondary | ICD-10-CM

## 2024-05-31 MED ORDER — BUDESONIDE 3 MG PO CPEP: ORAL_CAPSULE | ORAL | 0 refills | Status: DC

## 2024-05-31 NOTE — Progress Notes (Unsigned)
 GI Office Note    Referring Provider: Nemiah Kemps, MD Primary Care Physician:  Nemiah Kemps, MD Primary Gastroenterologist: Lamar HERO.Rourk, MD  Date:  05/31/2024  ID:  [name], DOB 08/21/1941, MRN [id number]   Chief Complaint   Chief Complaint  Patient presents with   Follow-up    Doing well, no issues   History of Present Illness  [name] is a 83 y.o. female with a history of HTN, hypothyroidism, HLD, COPD, CAD, collagenous colitis, GERD, and arthritis presenting today for follow up with no complaints  Colonoscopy May 2021: - Diverticulosis in the sigmoid colon and in the descending colon.  - The examination was otherwise normal on direct and retroflexion views.  - Status post segmental biopsy - Path: Findings consistent with collagenous colitis without dysplasia or malignancy.  Review of her last note she has tried several medications including Imodium, Colestid , dicyclomine  prior to diagnosis of collagenous colitis without improvement.  Since very 2023 she was maintained on lowest effective dose of budesonide  and had been doing well with budesonide  3 mg every 3 days however after trying to discontinue therapy in February 2024 she had recurrent diarrhea and was restarted on budesonide  9 mg daily in August 2024 with plans to treat for 8 weeks followed by taper.  Taper was prolonged due to recurrent diarrhea with starting Zetia  in October 2024.  In November 2024 she was doing well with budesonide  3 mg daily and again was advised to take her medication.  Last office visit 04/19/24.  Had started taking budesonide  6 mg daily about a week prior given she was having diarrhea worsening for a month prior.  She is unable to quantify the amount of bowel movements.  At time of visit was only having 1 loose bowel movement per day.  No changes in diet and no new medications.  Also noted antibiotics prior to onset or sick contacts.  Not taking NSAIDs.  She was advised to continue 6  mg budesonide  for 4 weeks then decrease to 3 mg daily.  Today:  Discussed the use of AI scribe software for clinical note transcription with the patient, who gave verbal consent to proceed.  She has been on budesonide , initially increased to 6 mg daily for a few weeks, with plans to reduce to 3 mg. However, she has not yet reduced the dose due to fear of exacerbating her symptoms, particularly diarrhea. Her bowel movements are currently fine, but when she experiences diarrhea, it is watery and not formed. This occurs intermittently, with some days having formed stools and others not. She recalls having diarrhea all day last Wednesday.  Her diet mainly consists of cooked vegetables such as green beans and pinto beans, and she avoids fried foods. She snacks on 'nabs' (peanut butter crackers) for breakfast, noting that the peanut butter in the crackers is minimal, mostly cracker.  She has a history of taking higher doses of budesonide , including 9 mg initially and as noted above has tried multiple other treatments for diarrhea and collagenous colitis. She currently has 6 mg tablets at home but is unsure if she has a refill for the 3 mg tablets. She has not experienced any abdominal pain or blood in her stool. Her appetite is stable, and she eats about the same amount as before, with her weight remaining consistent around 140 pounds.  No symptoms of reflux currently and she is not taking any medication for it. She has a history of taking pantoprazole and possibly  Nexium in the past, but her memory is unclear on this. No nausea, vomiting, or trouble swallowing.      Wt Readings from Last 5 Encounters:  05/31/24 142 lb 9.6 oz (64.7 kg)  04/19/24 143 lb 12.8 oz (65.2 kg)  04/10/24 140 lb (63.5 kg)  01/18/24 141 lb 12.8 oz (64.3 kg)  11/23/23 142 lb 9.6 oz (64.7 kg)    Current Outpatient Medications  Medication Sig Dispense Refill   acetaminophen  (TYLENOL ) 500 MG tablet Take 1,000 mg by mouth every  6 (six) hours as needed.     albuterol  (PROVENTIL  HFA;VENTOLIN  HFA) 108 (90 BASE) MCG/ACT inhaler Inhale 2 puffs into the lungs every 6 (six) hours as needed for wheezing or shortness of breath.     alendronate (FOSAMAX) 70 MG tablet Take 70 mg by mouth once a week.     ALPRAZolam (XANAX) 0.5 MG tablet Take 0.25 mg by mouth daily as needed for anxiety or sleep.     Ascorbic Acid (VITAMIN C) 1000 MG tablet Take 1,000 mg by mouth daily.     atorvastatin  (LIPITOR) 10 MG tablet Take 10 mg by mouth daily.     benazepril  (LOTENSIN ) 10 MG tablet Take 10 mg by mouth at bedtime.      budesonide  (ENTOCORT EC ) 3 MG 24 hr capsule Take 3 capsules (9 mg total) by mouth daily for 8 weeks, then decrease to 2 capsules (6 mg total) by mouth daily x 2 weeks, then decrease to 1 capsule (3 mg total) by mouth daily. (Patient taking differently: Take 6 mg by mouth daily. Take 3 capsules (9 mg total) by mouth daily for 8 weeks, then decrease to 2 capsules (6 mg total) by mouth daily x 2 weeks, then decrease to 1 capsule (3 mg total) by mouth daily.) 180 capsule 0   cholecalciferol (VITAMIN D3) 25 MCG (1000 UNIT) tablet Take 1,000 Units by mouth daily.     DULoxetine (CYMBALTA) 30 MG capsule Take 30 mg by mouth daily.     hydrochlorothiazide (MICROZIDE) 12.5 MG capsule Take 12.5 mg by mouth daily.      HYDROcodone -acetaminophen  (NORCO) 10-325 MG tablet TAKE 1 TABLET UP TO 4HTIMES DAILY.     levothyroxine  (SYNTHROID , LEVOTHROID) 88 MCG tablet Take 88 mcg by mouth daily before breakfast.     loperamide (IMODIUM) 2 MG capsule Take 2-4 mg by mouth as needed for diarrhea or loose stools.     MAGNESIUM PO Take 1 tablet by mouth daily.     MELATONIN PO Take 3 mg by mouth at bedtime.     Multiple Vitamins-Minerals (PRESERVISION AREDS 2) CAPS Take by mouth. 2 daily.     potassium chloride  SA (KLOR-CON  M) 20 MEQ tablet Take by mouth daily.     pregabalin (LYRICA) 50 MG capsule Take 50 mg by mouth 3 (three) times daily.     UNABLE  TO FIND daily at 6 (six) AM. Med Name: hair, skin, and nail supplement.     No current facility-administered medications for this visit.   Facility-Administered Medications Ordered in Other Visits  Medication Dose Route Frequency Provider Last Rate Last Admin   fentaNYL  (SUBLIMAZE ) injection 25-50 mcg  25-50 mcg Intravenous Q5 min PRN Tamea Ford, MD        Past Medical History:  Diagnosis Date   Arthritis    Carotid artery disease (HCC)    Collagenous colitis 01/2020   COPD (chronic obstructive pulmonary disease) (HCC)    Dysrhythmia    palpitations  GERD (gastroesophageal reflux disease)    Hypercholesteremia    Hypertension    Hypothyroidism    Shortness of breath     Past Surgical History:  Procedure Laterality Date   ABDOMINAL HYSTERECTOMY     APPENDECTOMY     BACK SURGERY  2019   BIOPSY  01/23/2020   Procedure: BIOPSY;  Surgeon: Shaaron Lamar HERO, MD;  [facility];  Service: Endoscopy;;  ascending, desending colon, sigmoid   BREAST BIOPSY Left 05/04/2017   Procedure: BREAST BIOPSY WITH NEEDLE LOCALIZATION;  Surgeon: Mavis Anes, MD;  Location: [facility];  Service: General;  Laterality: Left;  needle loc at 8:00   BREAST EXCISIONAL BIOPSY Left 2018   FIBROCYSTIC CHANGES   CATARACT EXTRACTION W/PHACO Left 09/30/2014   Procedure: CATARACT EXTRACTION PHACO AND INTRAOCULAR LENS PLACEMENT LEFT EYE;  Surgeon: Cherene Mania, MD;  Location: [facility];  Service: Ophthalmology;  Laterality: Left;  CDE:8.79   CATARACT EXTRACTION W/PHACO Right 10/17/2014   Procedure: CATARACT EXTRACTION PHACO AND INTRAOCULAR LENS PLACEMENT RIGHT EYE;  Surgeon: Cherene Mania, MD;  Location: [facility];  Service: Ophthalmology;  Laterality: Right;  CDE: 7.80   CHOLECYSTECTOMY N/A 04/30/2013   Procedure: Attempted LAPAROSCOPIC CHOLECYSTECTOMY ; converted to open procedure @ 1510;  Surgeon: Elsie GORMAN Holland, MD;  Location: [facility];  Service: General;  Laterality: N/A;   CHOLECYSTECTOMY N/A 04/30/2013    Procedure: CHOLECYSTECTOMY;  Surgeon: Elsie GORMAN Holland, MD;  Location: [facility];  Service: General;  Laterality: N/A;  procedure started @ 1520   COLONOSCOPY  06/11/2004   External hemorrhoids, few scattered left-sided diverticula, otherwise normal.  Repeat colonoscopy in 10 years.   COLONOSCOPY N/A 01/23/2020   Procedure: COLONOSCOPY;  Surgeon: Shaaron Lamar HERO, MD;  Multiple small largemouth diverticula in the sigmoid and descending colon, otherwise normal exam s/p segmental biopsies of right and left colon for histologic study.  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   ESOPHAGOGASTRODUODENOSCOPY  06/11/2004   Distal esophageal erosions consistent with mild erosive reflux esophagitis, heaped up adenomatous appearing prepyloric antral mucosa biopsy, single antral prepyloric ulcer biopsied, normal D1 and D2.   ESOPHAGOGASTRODUODENOSCOPY  10/07/2004   Distal esophageal erosions consistent with erosive reflux esophagitis, otherwise normal esophagus, normal stomach, previously noted ulcer healed completely, normal D1 and D2.   LAMINECTOMY  12/26/2017   spine surgery Dr Gaither   TUBAL LIGATION      Family History  Problem Relation Age of Onset   Heart failure Mother    Cancer Father    Colon cancer Neg Hx    Inflammatory bowel disease Neg Hx     Allergies as of 05/31/2024 - Review Complete 05/31/2024  Allergen Reaction Noted   Codeine Nausea And Vomiting 04/26/2013   Zetia  [ezetimibe ] Diarrhea and Itching 06/20/2023    Social History   Socioeconomic History   Marital status: Married    Spouse name: Not on file   Number of children: Not on file   Years of education: Not on file   Highest education level: Not on file  Occupational History   Not on file  Tobacco Use   Smoking status: Every Day    Current packs/day: 0.25    Average packs/day: 0.3 packs/day for 55.0 years (13.8 ttl pk-yrs)    Types: Cigarettes   Smokeless tobacco: Never   Tobacco comments:     5-6 cigs/day  Vaping Use   Vaping status: Never Used  Substance and Sexual Activity   Alcohol use: No   Drug  use: No   Sexual activity: Yes    Birth control/protection: Surgical  Other Topics Concern   Not on file  Social History Narrative   Not on file   Social Drivers of Health   Financial Resource Strain: Not on file  Food Insecurity: Not on file  Transportation Needs: Not on file  Physical Activity: Not on file  Stress: Not on file  Social Connections: Not on file     Review of Systems   Gen: Denies fever, chills, anorexia. Denies fatigue, weakness, weight loss.  CV: Denies chest pain, palpitations, syncope, peripheral edema, and claudication. Resp: Denies dyspnea at rest, cough, wheezing, coughing up blood, and pleurisy. GI: See HPI Derm: Denies rash, itching, dry skin Psych: Denies depression, anxiety, memory loss, confusion. No homicidal or suicidal ideation.  Heme: Denies bruising, bleeding, and enlarged lymph nodes.  Physical Exam   BP 135/61 (BP Location: Right Arm, Patient Position: Sitting, Cuff Size: Normal)   Pulse 78   Temp 98.5 F (36.9 C) (Oral)   Ht 5' 5 (1.651 m)   Wt 142 lb 9.6 oz (64.7 kg)   SpO2 94%   BMI 23.73 kg/m   General:   Alert and oriented. No distress noted. Pleasant and cooperative.  Head:  Normocephalic and atraumatic. Eyes:  Conjuctiva clear without scleral icterus. Mouth:  Oral mucosa pink and moist. Good dentition. No lesions. Abdomen:  +BS, soft, non-tender and non-distended. No rebound or guarding. No HSM or masses noted. Rectal: deferred Msk:  Symmetrical without gross deformities. Normal posture. Extremities:  Without edema. Neurologic:  Alert and  oriented x4 Psych:  Alert and cooperative. Normal mood and affect.  Assessment & Plan  KHIYA FRIESE is a 83 y.o. female presenting today for follow-up of collagenous colitis.     Collagenous colitis with chronic diarrhea Collagenous colitis with chronic diarrhea,  currently managed with budesonide . She has been on 6 mg of budesonide  for longer than the recommended four weeks due to fear of exacerbating diarrhea upon tapering. Bowel movements are sometimes formed but often loose, with episodes of diarrhea occurring. She primarily consumes cooked vegetables and nabs, with some suspicion that dietary factors may contribute to diarrhea. The goal is to taper budesonide  to 3 mg daily while maintaining control over diarrhea. Risks of long-term budesonide  use include osteoporosis, colon wall thinning, etc were discussed. Biologics are not preferred due to increased malignancy risk, particularly lymphoma, in the elderly. - Refill budesonide  3 mg tablets. - Instruct to alternate budesonide  dosing: 6 mg one day, 3 mg the next, and continue this pattern for four weeks and then will reduce to 3 mg daily. - Monitor for side effects of budesonide .  - Advised to observe dietary patterns for potential triggers of diarrhea.      Follow up   Follow up 8 weeks    Charmaine Melia, MSN, FNP-BC, AGACNP-BC [facility]

## 2024-05-31 NOTE — Patient Instructions (Addendum)
 Continue alternating budesonide  dosing 6 mg with 3 mg every other day.   Let me know if symptoms worsen while weaning the medication.   Follow up in 8 weeks.   It was a pleasure to see you today. I want to create trusting relationships with patients. If you receive a survey regarding your visit,  I greatly appreciate you taking time to fill this out on paper or through your MyChart. I value your feedback.  Charmaine Melia, MSN, FNP-BC, AGACNP-BC [facility]

## 2024-06-05 ENCOUNTER — Encounter: Admitting: Vascular Surgery

## 2024-06-07 ENCOUNTER — Other Ambulatory Visit: Payer: Self-pay | Admitting: Gastroenterology

## 2024-06-07 DIAGNOSIS — K52831 Collagenous colitis: Secondary | ICD-10-CM

## 2024-06-15 ENCOUNTER — Other Ambulatory Visit (HOSPITAL_COMMUNITY): Payer: Self-pay | Admitting: Family Medicine

## 2024-06-15 DIAGNOSIS — Z1231 Encounter for screening mammogram for malignant neoplasm of breast: Secondary | ICD-10-CM

## 2024-06-21 ENCOUNTER — Other Ambulatory Visit (HOSPITAL_COMMUNITY): Payer: Self-pay | Admitting: Family Medicine

## 2024-06-21 DIAGNOSIS — M79604 Pain in right leg: Secondary | ICD-10-CM

## 2024-06-25 ENCOUNTER — Ambulatory Visit (HOSPITAL_COMMUNITY)
Admission: RE | Admit: 2024-06-25 | Discharge: 2024-06-25 | Disposition: A | Discharge status: Home / Self Care | Source: Ambulatory Visit | Attending: Family Medicine | Admitting: Family Medicine

## 2024-06-25 DIAGNOSIS — M79604 Pain in right leg: Secondary | ICD-10-CM | POA: Diagnosis present

## 2024-06-25 DIAGNOSIS — M79605 Pain in left leg: Secondary | ICD-10-CM | POA: Diagnosis present

## 2024-07-20 ENCOUNTER — Ambulatory Visit (HOSPITAL_COMMUNITY)
Admission: RE | Admit: 2024-07-20 | Discharge: 2024-07-20 | Disposition: A | Discharge status: Home / Self Care | Source: Ambulatory Visit | Attending: Family Medicine | Admitting: Family Medicine

## 2024-07-20 ENCOUNTER — Encounter (HOSPITAL_COMMUNITY): Payer: Self-pay

## 2024-07-20 DIAGNOSIS — Z1231 Encounter for screening mammogram for malignant neoplasm of breast: Secondary | ICD-10-CM | POA: Insufficient documentation

## 2024-07-26 ENCOUNTER — Ambulatory Visit: Admitting: Gastroenterology

## 2024-07-26 ENCOUNTER — Encounter: Payer: Self-pay | Admitting: Gastroenterology

## 2024-07-26 VITALS — BP 171/67 | HR 66 | Temp 98.1°F | Ht 65.0 in | Wt 144.0 lb

## 2024-07-26 DIAGNOSIS — Z79899 Other long term (current) drug therapy: Secondary | ICD-10-CM | POA: Diagnosis not present

## 2024-07-26 DIAGNOSIS — K52831 Collagenous colitis: Secondary | ICD-10-CM

## 2024-07-26 NOTE — Patient Instructions (Addendum)
 Lets increase your budesonide  back up to 6 mg daily for 8 weeks and then alternate 6 mg and 3 mg and if you are still having issues with diarrhea in between then we will schedule 2 mg of Imodium each morning with the 3 mg of budesonide .  The goal is going to be to get you to 3 mg daily even if we need to supplement more with scheduled Imodium.  We may need to trial cholestyramine  again in combination with Imodium -we will see how the next 8-12 weeks ago.  Please continue to avoid as much dairy as possible.  It was a pleasure to see you today. I want to create trusting relationships with patients. If you receive a survey regarding your visit,  I greatly appreciate you taking time to fill this out on paper or through your MyChart. I value your feedback.  Charmaine Melia, MSN, FNP-BC, AGACNP-BC [facility]

## 2024-07-26 NOTE — Progress Notes (Signed)
 GI Office Note    Referring Provider: Nemiah Kemps, MD Primary Care Physician:  Nemiah Kemps, MD Primary Gastroenterologist: Lamar HERO.Rourk, MD  Date:  07/26/2024  ID:  [name], DOB Jan 05, 1941, MRN [id number]   Chief Complaint   Chief Complaint  Patient presents with   Follow-up    Still has issues with diarrhea from time to time   History of Present Illness  [name] is a 83 y.o. female with a history of HTN, hypothyroidism, HLD, COPD, CAD, collagenous colitis, GERD, and arthritis presenting today with complaint of intermittent diarrhea.   Colonoscopy May 2021: - Diverticulosis in the sigmoid colon and in the descending colon.  - The examination was otherwise normal on direct and retroflexion views.  - Status post segmental biopsy - Path: Findings consistent with collagenous colitis without dysplasia or malignancy.   Review of her last note she has tried several medications including Imodium, Colestid , dicyclomine  prior to diagnosis of collagenous colitis without improvement.  Since 2023 she was maintained on lowest effective dose of budesonide  and had been doing well with budesonide  3 mg every 3 days however after trying to discontinue therapy in February 2024 she had recurrent diarrhea and was restarted on budesonide  9 mg daily in August 2024 with plans to treat for 8 weeks followed by taper.  Taper was prolonged due to recurrent diarrhea with starting Zetia  in October 2024.  In November 2024 she was doing well with budesonide  3 mg daily and again was advised to take her medication.   OV 04/19/24.  Had started taking budesonide  6 mg daily about a week prior given she was having diarrhea worsening for a month prior.  She is unable to quantify the amount of bowel movements.  At time of visit was only having 1 loose bowel movement per day.  No changes in diet and no new medications.  Also noted antibiotics prior to onset or sick contacts.  Not taking NSAIDs.  She was  advised to continue 6 mg budesonide  for 4 weeks then decrease to 3 mg daily.  Last office visit 05/31/2024.  Advise she had been on budesonide  initially increased 6 mg for a few weeks with plans to reduce to 3 mg however she had not reduced due to fear of exacerbating her symptoms.  Bowels currently moving from the machine manage diarrhea is watery and not formed.  Diet consist of cooked vegetables such as green beans and pinto beans and avoids fried foods.  To snack on nabs for breakfast.  No symptoms reflux currently and not taking any medications for this.  Had history of taking pantoprazole and Nexium.  Denied nausea vomiting or dysphagia.  We discussed alternating budesonide  to 6 mg 1 day and 3 mg next and continuing that pattern for 4 weeks and then reduce to 3 mg daily and monitor for side effects of budesonide .  Also discussed potential triggers of her diarrhea.   Today:  Discussed the use of AI scribe software for clinical note transcription with the patient, who gave verbal consent to proceed.  She experiences chronic diarrhea, particularly when her medication dosage is reduced (budesonide  3 mg vs 6 mg). On a regimen of two tablets every other day and 3mg  on the other days, she experienced multiple episodes of diarrhea on the days on 1 ablet, including twice on the morning of today's visit. Her stool is often watery and never formed.   She takes budesonide  and does better on a dosage of 6mg .  She uses Imodium to manage symptoms on the days she takes budesonide  3 mg, taking two tablets earlier in the day before the visit. Per review of prior notes, she has tried Imodium, Colestid , dicyclomine  prior to her diagnosis of collagenous colitis and symptoms have persisted.   No significant abdominal pain, nausea, vomiting, dysphagia, or gastroesophageal reflux. She experiences fecal urgency but no significant pain with bowel movements. No melena, hematochezia, or significant weight loss.  Her diet  consists mainly of vegetables, and she typically eats 'nabs' in the morning and a larger meal at dinner. Despite her dietary habits, she has not experienced significant weight changes and is trying to lose weight to get below 140 pounds.      Wt Readings from Last 6 Encounters:  07/26/24 144 lb (65.3 kg)  05/31/24 142 lb 9.6 oz (64.7 kg)  04/19/24 143 lb 12.8 oz (65.2 kg)  04/10/24 140 lb (63.5 kg)  01/18/24 141 lb 12.8 oz (64.3 kg)  11/23/23 142 lb 9.6 oz (64.7 kg)    Body mass index is 23.96 kg/m.   Current Outpatient Medications  Medication Sig Dispense Refill   acetaminophen  (TYLENOL ) 500 MG tablet Take 1,000 mg by mouth every 6 (six) hours as needed.     albuterol  (PROVENTIL  HFA;VENTOLIN  HFA) 108 (90 BASE) MCG/ACT inhaler Inhale 2 puffs into the lungs every 6 (six) hours as needed for wheezing or shortness of breath.     alendronate (FOSAMAX) 70 MG tablet Take 70 mg by mouth once a week.     ALPRAZolam (XANAX) 0.5 MG tablet Take 0.25 mg by mouth daily as needed for anxiety or sleep.     Ascorbic Acid (VITAMIN C) 1000 MG tablet Take 1,000 mg by mouth daily.     atorvastatin  (LIPITOR) 10 MG tablet Take 10 mg by mouth daily.     benazepril  (LOTENSIN ) 10 MG tablet Take 10 mg by mouth at bedtime.      budesonide  (ENTOCORT EC ) 3 MG 24 hr capsule Alternate taking 2 capsules (6 mg total) one day with 1 tablet (3 mg) the next day and alternate for 4 weeks then reduce to 3 mg once daily. 180 capsule 0   cholecalciferol (VITAMIN D3) 25 MCG (1000 UNIT) tablet Take 1,000 Units by mouth daily.     DULoxetine (CYMBALTA) 30 MG capsule Take 30 mg by mouth daily.     hydrochlorothiazide (MICROZIDE) 12.5 MG capsule Take 12.5 mg by mouth daily.      HYDROcodone -acetaminophen  (NORCO) 10-325 MG tablet TAKE 1 TABLET UP TO 4HTIMES DAILY.     levothyroxine  (SYNTHROID , LEVOTHROID) 88 MCG tablet Take 88 mcg by mouth daily before breakfast.     loperamide (IMODIUM) 2 MG capsule Take 2-4 mg by mouth as needed  for diarrhea or loose stools.     MAGNESIUM PO Take 1 tablet by mouth daily.     MELATONIN PO Take 3 mg by mouth at bedtime.     Multiple Vitamins-Minerals (PRESERVISION AREDS 2) CAPS Take by mouth. 2 daily.     potassium chloride  SA (KLOR-CON  M) 20 MEQ tablet Take by mouth daily.     pregabalin (LYRICA) 50 MG capsule Take 50 mg by mouth 3 (three) times daily.     UNABLE TO FIND daily at 6 (six) AM. Med Name: hair, skin, and nail supplement.     No current facility-administered medications for this visit.   Facility-Administered Medications Ordered in Other Visits  Medication Dose Route Frequency Provider Last Rate Last Admin  fentaNYL  (SUBLIMAZE ) injection 25-50 mcg  25-50 mcg Intravenous Q5 min PRN Tamea Ford, MD        Past Medical History:  Diagnosis Date   Arthritis    Carotid artery disease    Collagenous colitis 01/2020   COPD (chronic obstructive pulmonary disease) (HCC)    Dysrhythmia    palpitations   GERD (gastroesophageal reflux disease)    Hypercholesteremia    Hypertension    Hypothyroidism    Shortness of breath     Past Surgical History:  Procedure Laterality Date   ABDOMINAL HYSTERECTOMY     APPENDECTOMY     BACK SURGERY  2019   BIOPSY  01/23/2020   Procedure: BIOPSY;  Surgeon: Shaaron Lamar HERO, MD;  Location: [facility];  Service: Endoscopy;;  ascending, desending colon, sigmoid   BREAST BIOPSY Left 05/04/2017   Procedure: BREAST BIOPSY WITH NEEDLE LOCALIZATION;  Surgeon: Mavis Anes, MD;  Location: [facility];  Service: General;  Laterality: Left;  needle loc at 8:00   BREAST EXCISIONAL BIOPSY Left 2018   FIBROCYSTIC CHANGES   CATARACT EXTRACTION W/PHACO Left 09/30/2014   Procedure: CATARACT EXTRACTION PHACO AND INTRAOCULAR LENS PLACEMENT LEFT EYE;  Surgeon: Cherene Mania, MD;  Location: [facility];  Service: Ophthalmology;  Laterality: Left;  CDE:8.79   CATARACT EXTRACTION W/PHACO Right 10/17/2014   Procedure: CATARACT EXTRACTION PHACO AND INTRAOCULAR  LENS PLACEMENT RIGHT EYE;  Surgeon: Cherene Mania, MD;  Location: [facility];  Service: Ophthalmology;  Laterality: Right;  CDE: 7.80   CHOLECYSTECTOMY N/A 04/30/2013   Procedure: Attempted LAPAROSCOPIC CHOLECYSTECTOMY ; converted to open procedure @ 1510;  Surgeon: Elsie GORMAN Holland, MD;  Location: [facility];  Service: General;  Laterality: N/A;   CHOLECYSTECTOMY N/A 04/30/2013   Procedure: CHOLECYSTECTOMY;  Surgeon: Elsie GORMAN Holland, MD;  Location: [facility];  Service: General;  Laterality: N/A;  procedure started @ 1520   COLONOSCOPY  06/11/2004   External hemorrhoids, few scattered left-sided diverticula, otherwise normal.  Repeat colonoscopy in 10 years.   COLONOSCOPY N/A 01/23/2020   Procedure: COLONOSCOPY;  Surgeon: Shaaron Lamar HERO, MD;  Multiple small largemouth diverticula in the sigmoid and descending colon, otherwise normal exam s/p segmental biopsies of right and left colon for histologic study.  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   ESOPHAGOGASTRODUODENOSCOPY  06/11/2004   Distal esophageal erosions consistent with mild erosive reflux esophagitis, heaped up adenomatous appearing prepyloric antral mucosa biopsy, single antral prepyloric ulcer biopsied, normal D1 and D2.   ESOPHAGOGASTRODUODENOSCOPY  10/07/2004   Distal esophageal erosions consistent with erosive reflux esophagitis, otherwise normal esophagus, normal stomach, previously noted ulcer healed completely, normal D1 and D2.   LAMINECTOMY  12/26/2017   spine surgery Dr Gaither   TUBAL LIGATION      Family History  Problem Relation Age of Onset   Heart failure Mother    Cancer Father    Colon cancer Neg Hx    Inflammatory bowel disease Neg Hx     Allergies as of 07/26/2024 - Review Complete 07/26/2024  Allergen Reaction Noted   Codeine Nausea And Vomiting 04/26/2013   Zetia  [ezetimibe ] Diarrhea and Itching 06/20/2023    Social History   Socioeconomic History   Marital status: Married    Spouse name:  Not on file   Number of children: Not on file   Years of education: Not on file   Highest education level: Not on file  Occupational History   Not on file  Tobacco Use   Smoking status: Every  Day    Current packs/day: 0.25    Average packs/day: 0.3 packs/day for 55.0 years (13.8 ttl pk-yrs)    Types: Cigarettes   Smokeless tobacco: Never   Tobacco comments:    5-6 cigs/day  Vaping Use   Vaping status: Never Used  Substance and Sexual Activity   Alcohol use: No   Drug use: No   Sexual activity: Yes    Birth control/protection: Surgical  Other Topics Concern   Not on file  Social History Narrative   Not on file   Social Drivers of Health   Financial Resource Strain: Not on file  Food Insecurity: Not on file  Transportation Needs: Not on file  Physical Activity: Not on file  Stress: Not on file  Social Connections: Not on file    Review of Systems   Gen: Denies fever, chills, anorexia. Denies fatigue, weakness, weight loss.  CV: Denies chest pain, palpitations, syncope, peripheral edema, and claudication. Resp: Denies dyspnea at rest, cough, wheezing, coughing up blood, and pleurisy. GI: See HPI Derm: Denies rash, itching, dry skin Heme: Denies bruising, bleeding, and enlarged lymph nodes.  Physical Exam   BP (!) 171/67 (BP Location: Right Arm, Patient Position: Sitting, Cuff Size: Normal)   Pulse 66   Temp 98.1 F (36.7 C) (Oral)   Ht 5' 5 (1.651 m)   Wt 144 lb (65.3 kg)   SpO2 95%   BMI 23.96 kg/m   General:   Alert and oriented. No distress noted. Pleasant and cooperative.  Head:  Normocephalic and atraumatic. Eyes:  Conjuctiva clear without scleral icterus. Abdomen:  +BS, soft, non-tender and non-distended. No rebound or guarding. No HSM or masses noted. Rectal: deferred Msk:  Symmetrical without gross deformities. Normal posture. Extremities:  Without edema. Neurologic:  Alert and  oriented x4 Psych:  Alert and cooperative. Normal mood and  affect.  Assessment & Plan  [name] is a 83 y.o. female presenting today for follow-up of collagenous colitis     Collagenous colitis with chronic diarrhea Chronic diarrhea secondary to collagenous colitis, diagnosed via colonoscopy in May 2021. Symptoms worsen with reduced budesonide  dosing (3 mg), regardless of diet. Current symptoms include urgency and loose stools, with occasional watery consistency. No significant abdominal pain, nausea, vomiting, or weight loss. Budesonide  6 mg is effective in reducing stool frequency but not in solidifying stools. Imodium is used to manage diarrhea when on lower dose of budesonide . Discussed potential side effects of long-term budesonide . Consideration of combination therapy in the future may need to be considered to avoid long term steroid use. - Continue budesonide  6 mg daily for 8 weeks then reduce back to alternating 6 mg and 3 mg and scheduling 2 mg Imodium on days of 3mg  with goal to taper to 3 mg daily and keeping with scheduled Imodium.  - Take one tablet of Imodium in the morning on days when taking one tablet of budesonide . - Will reassess symptoms after 8 -12 weeks.  - Will consider cholestyramine  or retrial of combination therapy in the future if symptoms persist.      Follow up   Follow up 3 months.   Charmaine Melia, MSN, FNP-BC, AGACNP-BC [facility]

## 2024-08-22 ENCOUNTER — Other Ambulatory Visit: Payer: Self-pay | Admitting: Gastroenterology

## 2024-08-22 ENCOUNTER — Telehealth: Payer: Self-pay | Admitting: *Deleted

## 2024-08-22 DIAGNOSIS — K52831 Collagenous colitis: Secondary | ICD-10-CM

## 2024-08-22 MED ORDER — BUDESONIDE 3 MG PO CPEP: 6.0000 mg | ORAL_CAPSULE | Freq: Every day | ORAL | 1 refills | Status: AC

## 2024-08-22 NOTE — Telephone Encounter (Signed)
 Pt called and states that she is out of budesonide . She states she is suppose to be taking 6 mg. Pt last OV 07/26/2024

## 2024-08-23 NOTE — Telephone Encounter (Signed)
 Noted

## 2024-10-01 ENCOUNTER — Telehealth: Payer: Self-pay | Admitting: Cardiovascular Disease

## 2024-10-01 ENCOUNTER — Encounter: Payer: Self-pay | Admitting: Cardiovascular Disease

## 2024-10-01 DIAGNOSIS — I6523 Occlusion and stenosis of bilateral carotid arteries: Secondary | ICD-10-CM

## 2024-10-01 NOTE — Addendum Note (Signed)
 Addended by: Shaelee Forni L on: 10/01/2024 12:12 PM   Modules accepted: Orders

## 2024-10-01 NOTE — Telephone Encounter (Signed)
 Pt needs order for annual carotid. Please advise.

## 2024-10-01 NOTE — Telephone Encounter (Signed)
 Orders placed for annual carotid dopplers.

## 2024-10-01 NOTE — Telephone Encounter (Signed)
 Patient would switch providers from Dr. Court to Dr. Debera. Please advise.

## 2024-10-11 ENCOUNTER — Other Ambulatory Visit (HOSPITAL_COMMUNITY): Payer: Self-pay | Admitting: Nurse Practitioner

## 2024-10-11 DIAGNOSIS — I6523 Occlusion and stenosis of bilateral carotid arteries: Secondary | ICD-10-CM

## 2024-10-18 ENCOUNTER — Ambulatory Visit (HOSPITAL_COMMUNITY)
Admission: RE | Admit: 2024-10-18 | Discharge: 2024-10-18 | Disposition: A | Discharge status: Home / Self Care | Source: Ambulatory Visit | Attending: Nurse Practitioner | Admitting: Nurse Practitioner

## 2024-10-18 DIAGNOSIS — I6523 Occlusion and stenosis of bilateral carotid arteries: Secondary | ICD-10-CM | POA: Insufficient documentation

## 2024-10-24 ENCOUNTER — Encounter: Payer: Self-pay | Admitting: Cardiology

## 2024-10-24 ENCOUNTER — Ambulatory Visit: Admitting: Cardiology

## 2024-10-24 VITALS — BP 114/54 | HR 65 | Ht 65.0 in | Wt 136.0 lb

## 2024-10-24 DIAGNOSIS — Z720 Tobacco use: Secondary | ICD-10-CM | POA: Diagnosis not present

## 2024-10-24 DIAGNOSIS — I10 Essential (primary) hypertension: Secondary | ICD-10-CM | POA: Diagnosis not present

## 2024-10-24 DIAGNOSIS — I6523 Occlusion and stenosis of bilateral carotid arteries: Secondary | ICD-10-CM

## 2024-10-24 DIAGNOSIS — E782 Mixed hyperlipidemia: Secondary | ICD-10-CM

## 2024-10-24 NOTE — Patient Instructions (Signed)
 Medication Instructions:   Your physician recommends that you continue on your current medications as directed. Please refer to the Current Medication list given to you today.   Labwork: None today  Testing/Procedures: None today  Follow-Up: 6 months Dr.McDowell  Any Other Special Instructions Will Be Listed Below (If Applicable).   Urgent referral placed to [facility] They are aware and will call you to schedule an appointment.  If you need a refill on your cardiac medications before your next appointment, please call your pharmacy.

## 2024-10-24 NOTE — Progress Notes (Addendum)
 "    Cardiology Office Note  Date: 10/24/2024   ID: NETTY SULLIVANT, DOB 13-Apr-1941, MRN [id number]  History of Present Illness: [name] is an 84 y.o. female former patient of Dr. Court now presenting to establish follow-up with me.  I reviewed her records, she was last seen in the office back in September 2024 by Ms. Jerilynn CHOL.  She is here today with her son.  Her husband recently passed away in early 2024/10/03 with chronic complex illness.  She is still obviously going through the grieving process.  She does not report any exertional symptoms.  We went over her medications.  She was having difficulty with statin myalgias on low-dose Lipitor, came off this medication within the last few months and was started on Repatha by Dr. Shona in December 2025.  Her LDL prior to making that switch was 139 in September 2025.  She is tolerating Repatha so far.  She did have a visit with [facility] in July 2025, saw Dr. Magda at that time for evaluation of what was ultimately felt to be radicular pain.  Carotids have been followed by Dr. Court over time.  I see that she just recently had follow-up carotid Dopplers on January 29 demonstrating 70 to 99% stenosis of bilateral ICAs.  This represents significant progression in comparison to prior study from September 2024.  I discussed this with the patient and her son today and have recommended follow-up with [facility] to discuss potential for revascularization.  She remains on aspirin  81 mg daily.  She has a longstanding history of tobacco use, we discussed this today.  She has had substantial difficulty trying to quit, did not tolerate Wellbutrin in the past.  I did talk with her about possibility of Chantix or using nicotine patches after picking an established quit date.  She did not seem to be ready to make a definitive plan today.  I reviewed her ECG today which shows sinus rhythm with decreased R wave progression.  Physical Exam: VS:  BP (!) 114/54 (BP Location: Right  Arm, Patient Position: Sitting, Cuff Size: Normal)   Pulse 65   Ht 5' 5 (1.651 m)   Wt 136 lb (61.7 kg)   SpO2 96%   BMI 22.63 kg/m , BMI Body mass index is 22.63 kg/m.  Wt Readings from Last 3 Encounters:  10/24/24 136 lb (61.7 kg)  07/26/24 144 lb (65.3 kg)  05/31/24 142 lb 9.6 oz (64.7 kg)    General: Patient appears comfortable at rest. HEENT: Conjunctiva and lids normal. Neck: Supple, no elevated JVP, bilateral carotid bruits. Lungs: Clear to auscultation, nonlabored breathing at rest. Cardiac: Regular rate and rhythm, no S3, 1/6 systolic murmur, no pericardial rub. Abdomen: Soft, bowel sounds present, no bruit. Extremities: No pitting edema, distal pulses 2+.  ECG:  An ECG dated 06/06/2023 was personally reviewed today and demonstrated:  Sinus bradycardia with decreased R wave progression.  Labwork:  September 2025: TSH 0.865, hemoglobin 11.1, platelets 253, BUN 17, creatinine 1.02, GFR 55, potassium 5, AST 15, ALT 10, cholesterol 218, triglycerides 108, HDL 60, LDL 139, hemoglobin A1c 5.8%  Other Studies Reviewed Today:  Carotid Dopplers 10/18/2024: IMPRESSION: 1. Hemodynamically significant bilateral internal carotid artery stenosis, greater on the left, estimated 70 - 99 percent diameter stenosis, associated with calcified atherosclerotic plaque. 2. Antegrade flow in the bilateral vertebral arteries.  Assessment and Plan:  1.  Primary hypertension.  Blood pressure controlled today.  She continues to follow with Dr. Shona for  primary care and is on HCTZ 12.5 mg daily along with Lotensin  10 mg daily.  2.  Carotid artery disease.  Severe bilateral ICA stenosis based on recent follow-up carotid Dopplers on January 29, previously followed by Dr. Court.  Present study shows significant progression compared to 2024.  I discussed this with the patient and her son today, we are requesting an urgent referral back to [facility] for further discussion regarding potential for  revascularization.  Continue aspirin  81 mg daily and Repatha 140 mg every 14 days.  3.  Mixed hyperlipidemia.  LDL 139 and HDL 60 in September 2025.  This is in the setting of statin myalgias, previously on Lipitor 10 mg daily.  Now on Repatha 140 mg every 14 days for approximately 1 month.  Would follow-up lipids with Dr. Shona as arranged, ideally would like LDL at a minimum under 70, ideally 55 or less.  4.  Longstanding tobacco use.  We discussed options for smoking cessation today as outlined above.  She has had significant difficulty quitting over the years.  Disposition:  Follow up 6 months.  Signed, Jayson JUDITHANN Sierras, M.D., F.A.C.C. [facility] "

## 2024-10-25 ENCOUNTER — Other Ambulatory Visit: Payer: Self-pay | Admitting: *Deleted

## 2024-10-25 DIAGNOSIS — I6523 Occlusion and stenosis of bilateral carotid arteries: Secondary | ICD-10-CM

## 2024-10-31 ENCOUNTER — Ambulatory Visit (HOSPITAL_COMMUNITY)

## 2024-11-06 ENCOUNTER — Ambulatory Visit: Admitting: Vascular Surgery
